# Patient Record
Sex: Female | Born: 1975
Health system: Southern US, Community
[De-identification: ages and names within clinical notes are randomized; demographics above are authoritative.]

## PROBLEM LIST (undated history)

## (undated) DIAGNOSIS — F79 Unspecified intellectual disabilities: Secondary | ICD-10-CM

## (undated) DIAGNOSIS — F988 Other specified behavioral and emotional disorders with onset usually occurring in childhood and adolescence: Secondary | ICD-10-CM

## (undated) DIAGNOSIS — E559 Vitamin D deficiency, unspecified: Secondary | ICD-10-CM

## (undated) DIAGNOSIS — E785 Hyperlipidemia, unspecified: Secondary | ICD-10-CM

## (undated) DIAGNOSIS — F429 Obsessive-compulsive disorder, unspecified: Secondary | ICD-10-CM

## (undated) HISTORY — PX: OTHER SURGICAL HISTORY: SHX169

## (undated) HISTORY — DX: Hyperlipidemia, unspecified: E78.5

## (undated) HISTORY — PX: EYE SURGERY: SHX253

## (undated) HISTORY — PX: HIP SURGERY: SHX245

## (undated) HISTORY — DX: Vitamin D deficiency, unspecified: E55.9

---

## 1998-11-25 ENCOUNTER — Ambulatory Visit (HOSPITAL_COMMUNITY): Admission: RE | Admit: 1998-11-25 | Discharge: 1998-11-25 | Payer: Self-pay | Admitting: Obstetrics and Gynecology

## 2011-01-19 ENCOUNTER — Other Ambulatory Visit: Payer: Self-pay | Admitting: Internal Medicine

## 2011-01-19 DIAGNOSIS — R198 Other specified symptoms and signs involving the digestive system and abdomen: Secondary | ICD-10-CM

## 2011-01-22 ENCOUNTER — Ambulatory Visit
Admission: RE | Admit: 2011-01-22 | Discharge: 2011-01-22 | Disposition: A | Discharge status: Home / Self Care | Payer: PRIVATE HEALTH INSURANCE | Source: Ambulatory Visit | Attending: Internal Medicine | Admitting: Internal Medicine

## 2011-01-22 DIAGNOSIS — R198 Other specified symptoms and signs involving the digestive system and abdomen: Secondary | ICD-10-CM

## 2011-12-25 ENCOUNTER — Ambulatory Visit (HOSPITAL_COMMUNITY)
Admission: RE | Admit: 2011-12-25 | Discharge: 2011-12-25 | Disposition: A | Discharge status: Home / Self Care | Payer: BC Managed Care – PPO | Source: Ambulatory Visit | Attending: Internal Medicine | Admitting: Internal Medicine

## 2011-12-25 ENCOUNTER — Other Ambulatory Visit (HOSPITAL_COMMUNITY): Payer: Self-pay | Admitting: Internal Medicine

## 2011-12-25 DIAGNOSIS — S91009A Unspecified open wound, unspecified ankle, initial encounter: Secondary | ICD-10-CM

## 2011-12-25 DIAGNOSIS — M7989 Other specified soft tissue disorders: Secondary | ICD-10-CM | POA: Insufficient documentation

## 2011-12-25 DIAGNOSIS — S81009A Unspecified open wound, unspecified knee, initial encounter: Secondary | ICD-10-CM

## 2011-12-25 DIAGNOSIS — M79609 Pain in unspecified limb: Secondary | ICD-10-CM | POA: Insufficient documentation

## 2011-12-25 DIAGNOSIS — M25559 Pain in unspecified hip: Secondary | ICD-10-CM | POA: Insufficient documentation

## 2013-05-04 ENCOUNTER — Ambulatory Visit (HOSPITAL_COMMUNITY)
Admission: RE | Admit: 2013-05-04 | Discharge: 2013-05-04 | Disposition: A | Discharge status: Home / Self Care | Payer: BC Managed Care – PPO | Source: Ambulatory Visit | Attending: Emergency Medicine | Admitting: Emergency Medicine

## 2013-05-04 ENCOUNTER — Other Ambulatory Visit (HOSPITAL_COMMUNITY): Payer: Self-pay | Admitting: Emergency Medicine

## 2013-05-04 DIAGNOSIS — M25569 Pain in unspecified knee: Secondary | ICD-10-CM | POA: Insufficient documentation

## 2013-05-04 DIAGNOSIS — M79609 Pain in unspecified limb: Secondary | ICD-10-CM

## 2013-05-16 ENCOUNTER — Ambulatory Visit: Payer: BC Managed Care – PPO | Attending: Sports Medicine | Admitting: Physical Therapy

## 2013-05-16 DIAGNOSIS — IMO0001 Reserved for inherently not codable concepts without codable children: Secondary | ICD-10-CM | POA: Insufficient documentation

## 2013-05-16 DIAGNOSIS — R293 Abnormal posture: Secondary | ICD-10-CM | POA: Insufficient documentation

## 2013-05-16 DIAGNOSIS — M6281 Muscle weakness (generalized): Secondary | ICD-10-CM | POA: Insufficient documentation

## 2013-05-16 DIAGNOSIS — M25569 Pain in unspecified knee: Secondary | ICD-10-CM | POA: Insufficient documentation

## 2013-05-22 ENCOUNTER — Ambulatory Visit: Payer: BC Managed Care – PPO | Attending: Sports Medicine | Admitting: Rehabilitation

## 2013-05-22 DIAGNOSIS — M6281 Muscle weakness (generalized): Secondary | ICD-10-CM | POA: Insufficient documentation

## 2013-05-22 DIAGNOSIS — IMO0001 Reserved for inherently not codable concepts without codable children: Secondary | ICD-10-CM | POA: Insufficient documentation

## 2013-05-22 DIAGNOSIS — R293 Abnormal posture: Secondary | ICD-10-CM | POA: Insufficient documentation

## 2013-05-22 DIAGNOSIS — M25569 Pain in unspecified knee: Secondary | ICD-10-CM | POA: Insufficient documentation

## 2013-05-25 ENCOUNTER — Ambulatory Visit: Payer: BC Managed Care – PPO | Admitting: Physical Therapy

## 2013-05-29 ENCOUNTER — Ambulatory Visit: Payer: BC Managed Care – PPO | Admitting: Rehabilitation

## 2013-06-01 ENCOUNTER — Ambulatory Visit: Payer: BC Managed Care – PPO | Admitting: Physical Therapy

## 2013-06-05 ENCOUNTER — Ambulatory Visit: Payer: BC Managed Care – PPO | Admitting: Physical Therapy

## 2013-06-08 ENCOUNTER — Ambulatory Visit: Payer: BC Managed Care – PPO | Admitting: Physical Therapy

## 2013-06-12 ENCOUNTER — Ambulatory Visit: Payer: BC Managed Care – PPO | Admitting: Physical Therapy

## 2013-06-17 ENCOUNTER — Encounter (HOSPITAL_BASED_OUTPATIENT_CLINIC_OR_DEPARTMENT_OTHER): Payer: Self-pay | Admitting: Emergency Medicine

## 2013-06-17 ENCOUNTER — Emergency Department (HOSPITAL_BASED_OUTPATIENT_CLINIC_OR_DEPARTMENT_OTHER): Payer: BC Managed Care – PPO

## 2013-06-17 ENCOUNTER — Emergency Department (HOSPITAL_BASED_OUTPATIENT_CLINIC_OR_DEPARTMENT_OTHER)
Admission: EM | Admit: 2013-06-17 | Discharge: 2013-06-17 | Disposition: A | Discharge status: Home / Self Care | Payer: BC Managed Care – PPO | Attending: Emergency Medicine | Admitting: Emergency Medicine

## 2013-06-17 DIAGNOSIS — S0990XA Unspecified injury of head, initial encounter: Secondary | ICD-10-CM | POA: Insufficient documentation

## 2013-06-17 DIAGNOSIS — Y9389 Activity, other specified: Secondary | ICD-10-CM | POA: Insufficient documentation

## 2013-06-17 DIAGNOSIS — M545 Low back pain, unspecified: Secondary | ICD-10-CM

## 2013-06-17 DIAGNOSIS — Z862 Personal history of diseases of the blood and blood-forming organs and certain disorders involving the immune mechanism: Secondary | ICD-10-CM | POA: Insufficient documentation

## 2013-06-17 DIAGNOSIS — Y9289 Other specified places as the place of occurrence of the external cause: Secondary | ICD-10-CM | POA: Diagnosis not present

## 2013-06-17 DIAGNOSIS — IMO0002 Reserved for concepts with insufficient information to code with codable children: Secondary | ICD-10-CM | POA: Insufficient documentation

## 2013-06-17 DIAGNOSIS — S3981XA Other specified injuries of abdomen, initial encounter: Secondary | ICD-10-CM | POA: Insufficient documentation

## 2013-06-17 DIAGNOSIS — Z8639 Personal history of other endocrine, nutritional and metabolic disease: Secondary | ICD-10-CM | POA: Insufficient documentation

## 2013-06-17 DIAGNOSIS — Z8659 Personal history of other mental and behavioral disorders: Secondary | ICD-10-CM | POA: Insufficient documentation

## 2013-06-17 HISTORY — DX: Other specified behavioral and emotional disorders with onset usually occurring in childhood and adolescence: F98.8

## 2013-06-17 HISTORY — DX: Obsessive-compulsive disorder, unspecified: F42.9

## 2013-06-17 HISTORY — DX: Unspecified intellectual disabilities: F79

## 2013-06-17 MED ORDER — IBUPROFEN 400 MG PO TABS
600.0000 mg | ORAL_TABLET | Freq: Once | ORAL | Status: AC
  Administered 2013-06-17: 600 mg via ORAL
  Filled 2013-06-17 (×2): qty 1

## 2013-06-17 NOTE — ED Notes (Signed)
Pt has been taken to radiology  
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
## 2013-06-19 ENCOUNTER — Encounter: Payer: Self-pay | Admitting: Physical Therapy

## 2013-06-21 ENCOUNTER — Encounter: Payer: Self-pay | Admitting: Internal Medicine

## 2013-06-21 DIAGNOSIS — F988 Other specified behavioral and emotional disorders with onset usually occurring in childhood and adolescence: Secondary | ICD-10-CM | POA: Insufficient documentation

## 2013-06-21 DIAGNOSIS — F79 Unspecified intellectual disabilities: Secondary | ICD-10-CM | POA: Insufficient documentation

## 2013-06-21 DIAGNOSIS — E785 Hyperlipidemia, unspecified: Secondary | ICD-10-CM | POA: Insufficient documentation

## 2013-06-21 DIAGNOSIS — E559 Vitamin D deficiency, unspecified: Secondary | ICD-10-CM | POA: Insufficient documentation

## 2013-06-22 ENCOUNTER — Encounter: Payer: Self-pay | Admitting: Physical Therapy

## 2013-06-23 ENCOUNTER — Ambulatory Visit (INDEPENDENT_AMBULATORY_CARE_PROVIDER_SITE_OTHER): Payer: BC Managed Care – PPO | Admitting: Emergency Medicine

## 2013-06-23 ENCOUNTER — Encounter: Payer: Self-pay | Admitting: Emergency Medicine

## 2013-06-23 VITALS — BP 138/88 | HR 84 | Temp 97.8°F | Resp 18 | Ht <= 58 in | Wt 137.0 lb

## 2013-06-23 DIAGNOSIS — R5381 Other malaise: Secondary | ICD-10-CM

## 2013-06-23 DIAGNOSIS — Z79899 Other long term (current) drug therapy: Secondary | ICD-10-CM

## 2013-06-23 DIAGNOSIS — E559 Vitamin D deficiency, unspecified: Secondary | ICD-10-CM

## 2013-06-23 DIAGNOSIS — E782 Mixed hyperlipidemia: Secondary | ICD-10-CM

## 2013-06-23 LAB — CBC WITH DIFFERENTIAL/PLATELET
Basophils Absolute: 0 10*3/uL (ref 0.0–0.1)
Eosinophils Absolute: 0.3 10*3/uL (ref 0.0–0.7)
Eosinophils Relative: 4 % (ref 0–5)
HCT: 40.9 % (ref 36.0–46.0)
Lymphocytes Relative: 27 % (ref 12–46)
MCH: 30.7 pg (ref 26.0–34.0)
MCV: 91.1 fL (ref 78.0–100.0)
Monocytes Absolute: 0.6 10*3/uL (ref 0.1–1.0)
Neutro Abs: 3.3 10*3/uL (ref 1.7–7.7)
Platelets: 318 10*3/uL (ref 150–400)
RDW: 12.7 % (ref 11.5–15.5)
WBC: 5.7 10*3/uL (ref 4.0–10.5)

## 2013-06-23 LAB — HEPATIC FUNCTION PANEL
Albumin: 4.1 g/dL (ref 3.5–5.2)
Alkaline Phosphatase: 70 U/L (ref 39–117)
Total Bilirubin: 0.5 mg/dL (ref 0.3–1.2)
Total Protein: 6.9 g/dL (ref 6.0–8.3)

## 2013-06-23 LAB — LIPID PANEL
Cholesterol: 181 mg/dL (ref 0–200)
HDL: 61 mg/dL (ref >39)
Total CHOL/HDL Ratio: 3 Ratio
Triglycerides: 59 mg/dL (ref <150)

## 2013-06-23 NOTE — Patient Instructions (Signed)
Fat and Cholesterol Control Diet  Fat and cholesterol levels in your blood and organs are influenced by your diet. High levels of fat and cholesterol may lead to diseases of the heart, small and large blood vessels, gallbladder, liver, and pancreas.  CONTROLLING FAT AND CHOLESTEROL WITH DIET  Although exercise and lifestyle factors are important, your diet is key. That is because certain foods are known to raise cholesterol and others to lower it. The goal is to balance foods for their effect on cholesterol and more importantly, to replace saturated and trans fat with other types of fat, such as monounsaturated fat, polyunsaturated fat, and omega-3 fatty acids.  On average, a person should consume no more than 15 to 17 g of saturated fat daily. Saturated and trans fats are considered "bad" fats, and they will raise LDL cholesterol. Saturated fats are primarily found in animal products such as meats, butter, and cream. However, that does not mean you need to give up all your favorite foods. Today, there are good tasting, low-fat, low-cholesterol substitutes for most of the things you like to eat. Choose low-fat or nonfat alternatives. Choose round or loin cuts of red meat. These types of cuts are lowest in fat and cholesterol. Chicken (without the skin), fish, veal, and ground turkey breast are great choices. Eliminate fatty meats, such as hot dogs and salami. Even shellfish have little or no saturated fat. Have a 3 oz (85 g) portion when you eat lean meat, poultry, or fish.  Trans fats are also called "partially hydrogenated oils." They are oils that have been scientifically manipulated so that they are solid at room temperature resulting in a longer shelf life and improved taste and texture of foods in which they are added. Trans fats are found in stick margarine, some tub margarines, cookies, crackers, and baked goods.   When baking and cooking, oils are a great substitute for butter. The monounsaturated oils are  especially beneficial since it is believed they lower LDL and raise HDL. The oils you should avoid entirely are saturated tropical oils, such as coconut and palm.   Remember to eat a lot from food groups that are naturally free of saturated and trans fat, including fish, fruit, vegetables, beans, grains (barley, rice, couscous, bulgur wheat), and pasta (without cream sauces).   IDENTIFYING FOODS THAT LOWER FAT AND CHOLESTEROL   Soluble fiber may lower your cholesterol. This type of fiber is found in fruits such as apples, vegetables such as broccoli, potatoes, and carrots, legumes such as beans, peas, and lentils, and grains such as barley. Foods fortified with plant sterols (phytosterol) may also lower cholesterol. You should eat at least 2 g per day of these foods for a cholesterol lowering effect.   Read package labels to identify low-saturated fats, trans fat free, and low-fat foods at the supermarket. Select cheeses that have only 2 to 3 g saturated fat per ounce. Use a heart-healthy tub margarine that is free of trans fats or partially hydrogenated oil. When buying baked goods (cookies, crackers), avoid partially hydrogenated oils. Breads and muffins should be made from whole grains (whole-wheat or whole oat flour, instead of "flour" or "enriched flour"). Buy non-creamy canned soups with reduced salt and no added fats.   FOOD PREPARATION TECHNIQUES   Never deep-fry. If you must fry, either stir-fry, which uses very little fat, or use non-stick cooking sprays. When possible, broil, bake, or roast meats, and steam vegetables. Instead of putting butter or margarine on vegetables, use lemon   and herbs, applesauce, and cinnamon (for squash and sweet potatoes). Use nonfat yogurt, salsa, and low-fat dressings for salads.   LOW-SATURATED FAT / LOW-FAT FOOD SUBSTITUTES  Meats / Saturated Fat (g)  · Avoid: Steak, marbled (3 oz/85 g) / 11 g  · Choose: Steak, lean (3 oz/85 g) / 4 g  · Avoid: Hamburger (3 oz/85 g) / 7  g  · Choose: Hamburger, lean (3 oz/85 g) / 5 g  · Avoid: Ham (3 oz/85 g) / 6 g  · Choose: Ham, lean cut (3 oz/85 g) / 2.4 g  · Avoid: Chicken, with skin, dark meat (3 oz/85 g) / 4 g  · Choose: Chicken, skin removed, dark meat (3 oz/85 g) / 2 g  · Avoid: Chicken, with skin, light meat (3 oz/85 g) / 2.5 g  · Choose: Chicken, skin removed, light meat (3 oz/85 g) / 1 g  Dairy / Saturated Fat (g)  · Avoid: Whole milk (1 cup) / 5 g  · Choose: Low-fat milk, 2% (1 cup) / 3 g  · Choose: Low-fat milk, 1% (1 cup) / 1.5 g  · Choose: Skim milk (1 cup) / 0.3 g  · Avoid: Hard cheese (1 oz/28 g) / 6 g  · Choose: Skim milk cheese (1 oz/28 g) / 2 to 3 g  · Avoid: Cottage cheese, 4% fat (1 cup) / 6.5 g  · Choose: Low-fat cottage cheese, 1% fat (1 cup) / 1.5 g  · Avoid: Ice cream (1 cup) / 9 g  · Choose: Sherbet (1 cup) / 2.5 g  · Choose: Nonfat frozen yogurt (1 cup) / 0.3 g  · Choose: Frozen fruit bar / trace  · Avoid: Whipped cream (1 tbs) / 3.5 g  · Choose: Nondairy whipped topping (1 tbs) / 1 g  Condiments / Saturated Fat (g)  · Avoid: Mayonnaise (1 tbs) / 2 g  · Choose: Low-fat mayonnaise (1 tbs) / 1 g  · Avoid: Butter (1 tbs) / 7 g  · Choose: Extra light margarine (1 tbs) / 1 g  · Avoid: Coconut oil (1 tbs) / 11.8 g  · Choose: Olive oil (1 tbs) / 1.8 g  · Choose: Corn oil (1 tbs) / 1.7 g  · Choose: Safflower oil (1 tbs) / 1.2 g  · Choose: Sunflower oil (1 tbs) / 1.4 g  · Choose: Soybean oil (1 tbs) / 2.4 g  · Choose: Canola oil (1 tbs) / 1 g  Document Released: 07/06/2005 Document Revised: 10/31/2012 Document Reviewed: 12/25/2010  ExitCare® Patient Information ©2014 [employer].

## 2013-06-24 LAB — VITAMIN D 25 HYDROXY (VIT D DEFICIENCY, FRACTURES): Vit D, 25-Hydroxy: 16 ng/mL — ABNORMAL LOW (ref 30–89)
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
## 2013-06-26 ENCOUNTER — Telehealth: Payer: Self-pay | Admitting: *Deleted

## 2013-06-26 NOTE — Telephone Encounter (Signed)
Message copied by Nicholaus Corolla A on Mon Jun 26, 2013  9:16 AM ------      Message from: [place], [place] R      Created: Sun Jun 25, 2013  7:45 PM       Cholesterol still mild elevated need better diet and cardio QD. Add 5000IU Vit D and 250 MG MAGnesium. Recheck 3 month OV ------

## 2013-06-28 NOTE — Telephone Encounter (Signed)
Spoke with patient's mom about lab results and instructions.

## 2013-07-31 ENCOUNTER — Encounter: Payer: Self-pay | Admitting: Emergency Medicine

## 2013-07-31 ENCOUNTER — Ambulatory Visit (INDEPENDENT_AMBULATORY_CARE_PROVIDER_SITE_OTHER): Payer: BC Managed Care – PPO | Admitting: Emergency Medicine

## 2013-07-31 VITALS — BP 100/62 | HR 68 | Temp 97.8°F | Resp 16 | Ht <= 58 in | Wt 135.0 lb

## 2013-07-31 DIAGNOSIS — J209 Acute bronchitis, unspecified: Secondary | ICD-10-CM

## 2013-07-31 DIAGNOSIS — J309 Allergic rhinitis, unspecified: Secondary | ICD-10-CM

## 2013-07-31 DIAGNOSIS — R05 Cough: Secondary | ICD-10-CM

## 2013-07-31 DIAGNOSIS — R059 Cough, unspecified: Secondary | ICD-10-CM

## 2013-07-31 MED ORDER — ALBUTEROL SULFATE HFA 108 (90 BASE) MCG/ACT IN AERS: 2.0000 | INHALATION_SPRAY | Freq: Four times a day (QID) | RESPIRATORY_TRACT | Status: DC | PRN

## 2013-07-31 MED ORDER — BENZONATATE 200 MG PO CAPS: 200.0000 mg | ORAL_CAPSULE | Freq: Three times a day (TID) | ORAL | Status: DC | PRN

## 2013-07-31 MED ORDER — PREDNISONE 10 MG PO TABS: ORAL_TABLET | ORAL | Status: DC

## 2013-07-31 MED ORDER — AZITHROMYCIN 250 MG PO TABS: ORAL_TABLET | ORAL | Status: AC

## 2013-07-31 NOTE — Patient Instructions (Signed)
Bronchitis °Bronchitis is swelling (inflammation) of the air tubes leading to your lungs (bronchi). This causes mucus and a cough. If the swelling gets bad, you may have trouble breathing. °HOME CARE  °· Rest. °· Drink enough fluids to keep your pee (urine) clear or pale yellow (unless you have a condition where you have to watch how much you drink). °· Only take medicine as told by your doctor. If you were given antibiotic medicines, finish them even if you start to feel better. °· Avoid smoke, irritating chemicals, and strong smells. These make the problem worse. Quit smoking if you smoke. This helps your lungs heal faster. °· Use a cool mist humidifier. Change the water in the humidifier every day. You can also sit in the bathroom with hot shower running for 5 10 minutes. Keep the door closed. °· See your health care provider as told. °· Wash your hands often. °GET HELP IF: °Your problems do not get better after 1 week. °GET HELP RIGHT AWAY IF:  °· Your fever gets worse. °· You have chills. °· Your chest hurts. °· Your problems breathing get worse. °· You have blood in your mucus. °· You pass out (faint). °· You feel lightheaded. °· You have a bad headache. °· You throw up (vomit) again and again. °MAKE SURE YOU: °· Understand these instructions. °· Will watch your condition. °· Will get help right away if you are not doing well or get worse. °Document Released: 12/23/2007 Document Revised: 04/26/2013 Document Reviewed: 02/28/2013 °ExitCare® Patient Information ©2014 [employer]. ° °

## 2013-07-31 NOTE — Progress Notes (Signed)
   Subjective:    Patient ID: Cathy Howard, female    DOB: Jun 11, 1976, 38 y.o.   MRN: [id number]  HPI Comments: 38 yo noticed cough/ ST Thursday. She notes increased symptoms with congestion and fever over the weekend. No relief with Muccinex/ Allegra. She has increased thick production with her cough and fatigue.   Current Outpatient Prescriptions on File Prior to Visit  Medication Sig Dispense Refill  . amphetamine-dextroamphetamine (ADDERALL XR) 15 MG 24 hr capsule Take 15 mg by mouth every morning.      Marland Kitchen. FLUoxetine (PROZAC) 20 MG tablet Take 20 mg by mouth. Takes 2 pills daily = 40 mg daily      . topiramate (TOPAMAX) 100 MG tablet Take 100 mg by mouth 2 (two) times daily.       No current facility-administered medications on file prior to visit.   ALLERGIES Maxitrol  Past Medical History  Diagnosis Date  . ADD (attention deficit disorder)   . Mental retardation   . OCD (obsessive compulsive disorder)   . Hyperlipidemia   . Unspecified vitamin D deficiency        Review of Systems  Constitutional: Positive for fever and fatigue.  HENT: Positive for congestion and sore throat.   Respiratory: Positive for cough.   All other systems reviewed and are negative.   BP 100/62  Pulse 68  Temp(Src) 97.8 F (36.6 C) (Temporal)  Resp 16  Ht 4' 8.75" (1.441 m)  Wt 135 lb (61.236 kg)  BMI 29.49 kg/m2     Objective:   Physical Exam  Nursing note and vitals reviewed. Constitutional: She is oriented to person, place, and time. She appears well-developed and well-nourished.  HENT:  Head: Normocephalic and atraumatic.  Right Ear: External ear normal.  Left Ear: External ear normal.  Nose: Nose normal.  Mouth/Throat: Oropharynx is clear and moist. No oropharyngeal exudate.  cloudy TMs  Eyes: Conjunctivae and EOM are normal.  Neck: Normal range of motion.  Cardiovascular: Normal rate, regular rhythm, normal heart sounds and intact distal pulses.   Pulmonary/Chest: Effort  normal.  Congested cough  Musculoskeletal: Normal range of motion.  Lymphadenopathy:    She has no cervical adenopathy.  Neurological: She is alert and oriented to person, place, and time.  Skin: Skin is warm and dry.  Psychiatric: She has a normal mood and affect. Judgment normal.          Assessment & Plan:  Cough/ Bronchitis/ URI/ Allergic rhinitis- Allegra OTC, increase H2o, allergy hygiene explained. Pred DP 10 mg, Tessalon Perles 200 mg, Albuterol inhaler all AD, if sx increase ZPAK.

## 2013-08-07 ENCOUNTER — Encounter: Payer: Self-pay | Admitting: Emergency Medicine

## 2013-08-07 ENCOUNTER — Ambulatory Visit (INDEPENDENT_AMBULATORY_CARE_PROVIDER_SITE_OTHER): Payer: BC Managed Care – PPO | Admitting: Emergency Medicine

## 2013-08-07 ENCOUNTER — Encounter (INDEPENDENT_AMBULATORY_CARE_PROVIDER_SITE_OTHER): Payer: Self-pay

## 2013-08-07 VITALS — BP 126/82 | HR 76 | Temp 97.8°F | Resp 18 | Ht <= 58 in | Wt 138.0 lb

## 2013-08-07 DIAGNOSIS — J209 Acute bronchitis, unspecified: Secondary | ICD-10-CM

## 2013-08-07 DIAGNOSIS — L738 Other specified follicular disorders: Secondary | ICD-10-CM

## 2013-08-07 DIAGNOSIS — L678 Other hair color and hair shaft abnormalities: Secondary | ICD-10-CM

## 2013-08-07 DIAGNOSIS — L739 Follicular disorder, unspecified: Secondary | ICD-10-CM

## 2013-08-07 MED ORDER — DOXYCYCLINE HYCLATE 100 MG PO TABS: 100.0000 mg | ORAL_TABLET | Freq: Two times a day (BID) | ORAL | Status: DC

## 2013-08-07 NOTE — Patient Instructions (Signed)
Folliculitis  Folliculitis is redness, soreness, and swelling (inflammation) of the hair follicles. This condition can occur anywhere on the body. People with weakened immune systems, diabetes, or obesity have a greater risk of getting folliculitis. CAUSES  Bacterial infection. This is the most common cause.  Fungal infection.  Viral infection.  Contact with certain chemicals, especially oils and tars. Long-term folliculitis can result from bacteria that live in the nostrils. The bacteria may trigger multiple outbreaks of folliculitis over time. SYMPTOMS Folliculitis most commonly occurs on the scalp, thighs, legs, back, buttocks, and areas where hair is shaved frequently. An early sign of folliculitis is a small, white or yellow, pus-filled, itchy lesion (pustule). These lesions appear on a red, inflamed follicle. They are usually less than 0.2 inches (5 mm) wide. When there is an infection of the follicle that goes deeper, it becomes a boil or furuncle. A group of closely packed boils creates a larger lesion (carbuncle). Carbuncles tend to occur in hairy, sweaty areas of the body. DIAGNOSIS  Your caregiver can usually tell what is wrong by doing a physical exam. A sample may be taken from one of the lesions and tested in a lab. This can help determine what is causing your folliculitis. TREATMENT  Treatment may include:  Applying warm compresses to the affected areas.  Taking antibiotic medicines orally or applying them to the skin.  Draining the lesions if they contain a large amount of pus or fluid.  Laser hair removal for cases of long-lasting folliculitis. This helps to prevent regrowth of the hair. HOME CARE INSTRUCTIONS  Apply warm compresses to the affected areas as directed by your caregiver.  If antibiotics are prescribed, take them as directed. Finish them even if you start to feel better.  You may take over-the-counter medicines to relieve itching.  Do not shave  irritated skin.  Follow up with your caregiver as directed. SEEK IMMEDIATE MEDICAL CARE IF:   You have increasing redness, swelling, or pain in the affected area.  You have a fever. MAKE SURE YOU:  Understand these instructions.  Will watch your condition.  Will get help right away if you are not doing well or get worse. Document Released: 09/14/2001 Document Revised: 01/05/2012 Document Reviewed: 10/06/2011 ExitCare Patient Information 2014 [employer].  

## 2013-08-07 NOTE — Progress Notes (Signed)
Subjective:    Patient ID: Cathy Howard, female    DOB: Nov 27, 1975, 38 y.o.   MRN: [id number]  HPI Comments: 38 YO FEMALE RECENTLY treated with Prednisone DP for Bronchitis with increased symptoms mother started ZPAK. She finished ZPAK and is on the Prednisone 10 mg she has 2 days left of dose pack. She is still coughing and now wheezing.   She has also noticed tiny red and yellow pimple over chest and back over last day or so. She denies fever, itching or new exposures.  Cough Associated symptoms include a rash and wheezing.  Wheezing  Associated symptoms include coughing and a rash.   Current Outpatient Prescriptions on File Prior to Visit  Medication Sig Dispense Refill  . amphetamine-dextroamphetamine (ADDERALL XR) 15 MG 24 hr capsule Take 15 mg by mouth every morning.      . benzonatate (TESSALON) 200 MG capsule Take 1 capsule (200 mg total) by mouth 3 (three) times daily as needed for cough.  20 capsule  0  . Cholecalciferol (VITAMIN D3) 5000 UNITS CAPS Take 5,000 Units by mouth daily.      Marland Kitchen FLUoxetine (PROZAC) 20 MG tablet Take 20 mg by mouth. Takes 2 pills daily = 40 mg daily      . Magnesium 250 MG TABS Take 250 mg by mouth daily.      . predniSONE (DELTASONE) 10 MG tablet 1 po TID x 3 days, 1 PO BID x 3 days, 1 po QD x 5 days  30 tablet  0  . topiramate (TOPAMAX) 100 MG tablet Take 100 mg by mouth 2 (two) times daily.      Marland Kitchen albuterol (PROVENTIL HFA;VENTOLIN HFA) 108 (90 BASE) MCG/ACT inhaler Inhale 2 puffs into the lungs every 6 (six) hours as needed for wheezing or shortness of breath.  1 Inhaler  2   No current facility-administered medications on file prior to visit.   ALLERGIES Maxitrol  Past Medical History  Diagnosis Date  . ADD (attention deficit disorder)   . Mental retardation   . OCD (obsessive compulsive disorder)   . Hyperlipidemia   . Unspecified vitamin D deficiency       Review of Systems  Respiratory: Positive for cough and wheezing.   Skin:  Positive for rash.  All other systems reviewed and are negative.   BP 126/82  Pulse 76  Temp(Src) 97.8 F (36.6 C) (Temporal)  Resp 18  Ht 4' 8.75" (1.441 m)  Wt 138 lb (62.596 kg)  BMI 30.15 kg/m2     Objective:   Physical Exam  Nursing note and vitals reviewed. Constitutional: She is oriented to person, place, and time. She appears well-developed and well-nourished. No distress.  HENT:  Head: Normocephalic and atraumatic.  Right Ear: External ear normal.  Left Ear: External ear normal.  Nose: Nose normal.  Mouth/Throat: Oropharynx is clear and moist.  Eyes: Conjunctivae and EOM are normal.  Neck: Normal range of motion. Neck supple. No JVD present. No thyromegaly present.  Cardiovascular: Normal rate, regular rhythm, normal heart sounds and intact distal pulses.   Pulmonary/Chest: Effort normal and breath sounds normal.  + improvement but still with congested cough  Abdominal: Soft. Bowel sounds are normal. She exhibits no distension and no mass. There is no tenderness. There is no rebound and no guarding.  Musculoskeletal: Normal range of motion. She exhibits no edema and no tenderness.  Lymphadenopathy:    She has no cervical adenopathy.  Neurological: She is alert and oriented to  person, place, and time. No cranial nerve deficit.  Skin: Skin is warm and dry. Rash noted. No erythema. No pallor.     Psychiatric: She has a normal mood and affect. Her behavior is normal. Judgment and thought content normal.          Assessment & Plan:  1. ? Folliculitis- Hygiene explained, Doxy 100 mg AD, w/c with results or if sx increase 2. Bronchitis- Start Inhaler AD, spacer given, finish Pred DP AD, w/c if SX increase or ER.

## 2013-08-16 ENCOUNTER — Telehealth: Payer: Self-pay | Admitting: Internal Medicine

## 2013-08-16 NOTE — Telephone Encounter (Signed)
Chestine Sporeatricia Thomas called for pt., to get advise on continued cough, and congestion.  Ranata finished antibiotics today. Per Loree FeeMelissa Smith, continue to push fluids and add sudafed daily for 2 days.  If this does not help a referral to allergist may be needed.

## 2013-09-27 ENCOUNTER — Encounter: Payer: Self-pay | Admitting: Emergency Medicine

## 2013-09-27 ENCOUNTER — Ambulatory Visit (INDEPENDENT_AMBULATORY_CARE_PROVIDER_SITE_OTHER): Payer: BC Managed Care – PPO | Admitting: Emergency Medicine

## 2013-09-27 VITALS — BP 118/64 | HR 64 | Temp 98.2°F | Resp 16 | Ht <= 58 in | Wt 137.0 lb

## 2013-09-27 DIAGNOSIS — R059 Cough, unspecified: Secondary | ICD-10-CM

## 2013-09-27 DIAGNOSIS — E782 Mixed hyperlipidemia: Secondary | ICD-10-CM

## 2013-09-27 DIAGNOSIS — R05 Cough: Secondary | ICD-10-CM

## 2013-09-27 DIAGNOSIS — R5383 Other fatigue: Secondary | ICD-10-CM

## 2013-09-27 DIAGNOSIS — R5381 Other malaise: Secondary | ICD-10-CM

## 2013-09-27 DIAGNOSIS — I10 Essential (primary) hypertension: Secondary | ICD-10-CM

## 2013-09-27 LAB — HEPATIC FUNCTION PANEL
ALT: 12 U/L (ref 0–35)
AST: 20 U/L (ref 0–37)
Albumin: 4.2 g/dL (ref 3.5–5.2)
Alkaline Phosphatase: 81 U/L (ref 39–117)
BILIRUBIN INDIRECT: 0.2 mg/dL (ref 0.2–1.2)
Bilirubin, Direct: 0.1 mg/dL (ref 0.0–0.3)
TOTAL PROTEIN: 6.4 g/dL (ref 6.0–8.3)
Total Bilirubin: 0.3 mg/dL (ref 0.2–1.2)

## 2013-09-27 LAB — BASIC METABOLIC PANEL WITH GFR
BUN: 20 mg/dL (ref 6–23)
CO2: 26 meq/L (ref 19–32)
CREATININE: 0.56 mg/dL (ref 0.50–1.10)
Calcium: 9.3 mg/dL (ref 8.4–10.5)
Chloride: 104 mEq/L (ref 96–112)
GFR, Est African American: >89 mL/min
GFR, Est Non African American: >89 mL/min
GLUCOSE: 78 mg/dL (ref 70–99)
Potassium: 4 mEq/L (ref 3.5–5.3)
Sodium: 139 mEq/L (ref 135–145)

## 2013-09-27 LAB — CBC WITH DIFFERENTIAL/PLATELET
BASOS ABS: 0 10*3/uL (ref 0.0–0.1)
Basophils Relative: 0 % (ref 0–1)
EOS ABS: 0.1 10*3/uL (ref 0.0–0.7)
EOS PCT: 3 % (ref 0–5)
HCT: 40.2 % (ref 36.0–46.0)
Hemoglobin: 13.4 g/dL (ref 12.0–15.0)
Lymphocytes Relative: 23 % (ref 12–46)
Lymphs Abs: 1.1 10*3/uL (ref 0.7–4.0)
MCH: 30.4 pg (ref 26.0–34.0)
MCHC: 33.3 g/dL (ref 30.0–36.0)
MCV: 91.2 fL (ref 78.0–100.0)
MONO ABS: 0.5 10*3/uL (ref 0.1–1.0)
Monocytes Relative: 10 % (ref 3–12)
Neutro Abs: 3.1 10*3/uL (ref 1.7–7.7)
Neutrophils Relative %: 64 % (ref 43–77)
Platelets: 305 10*3/uL (ref 150–400)
RBC: 4.41 MIL/uL (ref 3.87–5.11)
RDW: 13.7 % (ref 11.5–15.5)
WBC: 4.9 10*3/uL (ref 4.0–10.5)

## 2013-09-27 LAB — LIPID PANEL
CHOLESTEROL: 191 mg/dL (ref 0–200)
HDL: 65 mg/dL (ref >39)
LDL CALC: 107 mg/dL — AB (ref 0–99)
Total CHOL/HDL Ratio: 2.9 Ratio
Triglycerides: 96 mg/dL (ref <150)
VLDL: 19 mg/dL (ref 0–40)

## 2013-09-27 NOTE — Patient Instructions (Signed)
Fat and Cholesterol Control Diet Fat and cholesterol levels in your blood and organs are influenced by your diet. High levels of fat and cholesterol may lead to diseases of the heart, small and large blood vessels, gallbladder, liver, and pancreas. CONTROLLING FAT AND CHOLESTEROL WITH DIET Although exercise and lifestyle factors are important, your diet is key. That is because certain foods are known to raise cholesterol and others to lower it. The goal is to balance foods for their effect on cholesterol and more importantly, to replace saturated and trans fat with other types of fat, such as monounsaturated fat, polyunsaturated fat, and omega-3 fatty acids. On average, a person should consume no more than 15 to 17 g of saturated fat daily. Saturated and trans fats are considered "bad" fats, and they will raise LDL cholesterol. Saturated fats are primarily found in animal products such as meats, butter, and cream. However, that does not mean you need to give up all your favorite foods. Today, there are good tasting, low-fat, low-cholesterol substitutes for most of the things you like to eat. Choose low-fat or nonfat alternatives. Choose round or loin cuts of red meat. These types of cuts are lowest in fat and cholesterol. Chicken (without the skin), fish, veal, and ground turkey breast are great choices. Eliminate fatty meats, such as hot dogs and salami. Even shellfish have little or no saturated fat. Have a 3 oz (85 g) portion when you eat lean meat, poultry, or fish. Trans fats are also called "partially hydrogenated oils." They are oils that have been scientifically manipulated so that they are solid at room temperature resulting in a longer shelf life and improved taste and texture of foods in which they are added. Trans fats are found in stick margarine, some tub margarines, cookies, crackers, and baked goods.  When baking and cooking, oils are a great substitute for butter. The monounsaturated oils are  especially beneficial since it is believed they lower LDL and raise HDL. The oils you should avoid entirely are saturated tropical oils, such as coconut and palm.  Remember to eat a lot from food groups that are naturally free of saturated and trans fat, including fish, fruit, vegetables, beans, grains (barley, rice, couscous, bulgur wheat), and pasta (without cream sauces).  IDENTIFYING FOODS THAT LOWER FAT AND CHOLESTEROL  Soluble fiber may lower your cholesterol. This type of fiber is found in fruits such as apples, vegetables such as broccoli, potatoes, and carrots, legumes such as beans, peas, and lentils, and grains such as barley. Foods fortified with plant sterols (phytosterol) may also lower cholesterol. You should eat at least 2 g per day of these foods for a cholesterol lowering effect.  Read package labels to identify low-saturated fats, trans fat free, and low-fat foods at the supermarket. Select cheeses that have only 2 to 3 g saturated fat per ounce. Use a heart-healthy tub margarine that is free of trans fats or partially hydrogenated oil. When buying baked goods (cookies, crackers), avoid partially hydrogenated oils. Breads and muffins should be made from whole grains (whole-wheat or whole oat flour, instead of "flour" or "enriched flour"). Buy non-creamy canned soups with reduced salt and no added fats.  FOOD PREPARATION TECHNIQUES  Never deep-fry. If you must fry, either stir-fry, which uses very little fat, or use non-stick cooking sprays. When possible, broil, bake, or roast meats, and steam vegetables. Instead of putting butter or margarine on vegetables, use lemon and herbs, applesauce, and cinnamon (for squash and sweet potatoes). Use nonfat   yogurt, salsa, and low-fat dressings for salads.  LOW-SATURATED FAT / LOW-FAT FOOD SUBSTITUTES Meats / Saturated Fat (g)  Avoid: Steak, marbled (3 oz/85 g) / 11 g  Choose: Steak, lean (3 oz/85 g) / 4 g  Avoid: Hamburger (3 oz/85 g) / 7  g  Choose: Hamburger, lean (3 oz/85 g) / 5 g  Avoid: Ham (3 oz/85 g) / 6 g  Choose: Ham, lean cut (3 oz/85 g) / 2.4 g  Avoid: Chicken, with skin, dark meat (3 oz/85 g) / 4 g  Choose: Chicken, skin removed, dark meat (3 oz/85 g) / 2 g  Avoid: Chicken, with skin, light meat (3 oz/85 g) / 2.5 g  Choose: Chicken, skin removed, light meat (3 oz/85 g) / 1 g Dairy / Saturated Fat (g)  Avoid: Whole milk (1 cup) / 5 g  Choose: Low-fat milk, 2% (1 cup) / 3 g  Choose: Low-fat milk, 1% (1 cup) / 1.5 g  Choose: Skim milk (1 cup) / 0.3 g  Avoid: Hard cheese (1 oz/28 g) / 6 g  Choose: Skim milk cheese (1 oz/28 g) / 2 to 3 g  Avoid: Cottage cheese, 4% fat (1 cup) / 6.5 g  Choose: Low-fat cottage cheese, 1% fat (1 cup) / 1.5 g  Avoid: Ice cream (1 cup) / 9 g  Choose: Sherbet (1 cup) / 2.5 g  Choose: Nonfat frozen yogurt (1 cup) / 0.3 g  Choose: Frozen fruit bar / trace  Avoid: Whipped cream (1 tbs) / 3.5 g  Choose: Nondairy whipped topping (1 tbs) / 1 g Condiments / Saturated Fat (g)  Avoid: Mayonnaise (1 tbs) / 2 g  Choose: Low-fat mayonnaise (1 tbs) / 1 g  Avoid: Butter (1 tbs) / 7 g  Choose: Extra light margarine (1 tbs) / 1 g  Avoid: Coconut oil (1 tbs) / 11.8 g  Choose: Olive oil (1 tbs) / 1.8 g  Choose: Corn oil (1 tbs) / 1.7 g  Choose: Safflower oil (1 tbs) / 1.2 g  Choose: Sunflower oil (1 tbs) / 1.4 g  Choose: Soybean oil (1 tbs) / 2.4 g  Choose: Canola oil (1 tbs) / 1 g Document Released: 07/06/2005 Document Revised: 10/31/2012 Document Reviewed: 12/25/2010 Exit[employer] Patient Information 2014 [place], [place]. Cough, Adult  A cough is a reflex that helps clear your throat and airways. It can help heal the body or may be a reaction to an irritated airway. A cough may only last 2 or 3 weeks (acute) or may last more than 8 weeks (chronic).  CAUSES Acute cough:  Viral or bacterial infections. Chronic  cough:  Infections.  Allergies.  Asthma.  Post-nasal drip.  Smoking.  Heartburn or acid reflux.  Some medicines.  Chronic lung problems (COPD).  Cancer. SYMPTOMS   Cough.  Fever.  Chest pain.  Increased breathing rate.  High-pitched whistling sound when breathing (wheezing).  Colored mucus that you cough up (sputum). TREATMENT   A bacterial cough may be treated with antibiotic medicine.  A viral cough must run its course and will not respond to antibiotics.  Your caregiver may recommend other treatments if you have a chronic cough. HOME CARE INSTRUCTIONS   Only take over-the-counter or prescription medicines for pain, discomfort, or fever as directed by your caregiver. Use cough suppressants only as directed by your caregiver.  Use a cold steam vaporizer or humidifier in your bedroom or home to help loosen secretions.  Sleep in a semi-upright position if your cough  is worse at night.  Rest as needed.  Stop smoking if you smoke. SEEK IMMEDIATE MEDICAL CARE IF:   You have pus in your sputum.  Your cough starts to worsen.  You cannot control your cough with suppressants and are losing sleep.  You begin coughing up blood.  You have difficulty breathing.  You develop pain which is getting worse or is uncontrolled with medicine.  You have a fever. MAKE SURE YOU:   Understand these instructions.  Will watch your condition.  Will get help right away if you are not doing well or get worse. Document Released: 01/02/2011 Document Revised: 09/28/2011 Document Reviewed: 01/02/2011 [facility] Patient Information 2014 [place], [place].

## 2013-09-27 NOTE — Progress Notes (Signed)
Subjective:    Patient ID: Cathy Howard, female    DOB: [birth year], 38 y.o.   MRN: [id number]  HPI Comments: 38 yo female with f/u for cholesterol. He has not been exercising routinely but keeps busy. He has not been eating healthy. She has started coughing again and notes she has been on allergy medicine w/o relief. She denies any reflux. Last labs CHOL         181   06/23/2013 HDL           61   06/23/2013 LDLCALC      108   06/23/2013 TRIG          59   06/23/2013 CHOLHDL      3.0   06/23/2013 ALT           11   06/23/2013 AST           22   06/23/2013 ALKPHOS       70   06/23/2013 BILITOT      0.5   06/23/2013 CBC WBC            5.7    06/23/2013 0957 RBC           4.49    06/23/2013 0957 HGB           13.8    06/23/2013 0957 HCT           40.9    06/23/2013 0957 PLT            318    06/23/2013 0957 MCV           91.1    06/23/2013 0957 MCH           30.7    06/23/2013 0957 MCHC          33.7    06/23/2013 0957 RDW           12.7    06/23/2013 0957 LYMPHSABS      1.6    06/23/2013 0957 MONOABS        0.6    06/23/2013 0957 EOSABS         0.3    06/23/2013 0957 BASOSABS       0.0    06/23/2013 0957    Current Outpatient Prescriptions on File Prior to Visit  Medication Sig Dispense Refill  . albuterol (PROVENTIL HFA;VENTOLIN HFA) 108 (90 BASE) MCG/ACT inhaler Inhale 2 puffs into the lungs every 6 (six) hours as needed for wheezing or shortness of breath.  1 Inhaler  2  . amphetamine-dextroamphetamine (ADDERALL XR) 15 MG 24 hr capsule Take 15 mg by mouth every morning.      . Cholecalciferol (VITAMIN D3) 5000 UNITS CAPS Take 5,000 Units by mouth daily.      Marland Kitchen FLUoxetine (PROZAC) 20 MG tablet Take 20 mg by mouth. Takes 2 pills daily = 40 mg daily      . Magnesium 250 MG TABS Take 250 mg by mouth daily.      Marland Kitchen topiramate (TOPAMAX) 100 MG tablet Take 100 mg by mouth 2 (two) times daily.       No current facility-administered medications on file prior to visit.   Allergies  Allergen Reactions  .  Maxitrol [Neomycin-Polymyxin-Dexameth]    Past Medical History  Diagnosis Date  . ADD (attention deficit disorder)   . Mental retardation   . OCD (obsessive compulsive disorder)   . Hyperlipidemia   . Unspecified vitamin D deficiency  Review of Systems  HENT: Positive for congestion and postnasal drip.   Respiratory: Positive for cough.   All other systems reviewed and are negative.   BP 118/64  Pulse 64  Temp(Src) 98.2 F (36.8 C) (Temporal)  Resp 16  Ht 4' 8.5" (1.435 m)  Wt 137 lb (62.143 kg)  BMI 30.18 kg/m2     Objective:   Physical Exam  Nursing note and vitals reviewed. Constitutional: She is oriented to person, place, and time. She appears well-developed and well-nourished. No distress.  HENT:  Head: Normocephalic and atraumatic.  Right Ear: External ear normal.  Left Ear: External ear normal.  Nose: Nose normal.  Mouth/Throat: Oropharynx is clear and moist.  Eyes: Conjunctivae and EOM are normal.  Neck: Normal range of motion. Neck supple. No JVD present. No thyromegaly present.  Cardiovascular: Normal rate, regular rhythm, normal heart sounds and intact distal pulses.   Pulmonary/Chest: Effort normal and breath sounds normal.  Abdominal: Soft. Bowel sounds are normal. She exhibits no distension and no mass. There is no tenderness. There is no rebound and no guarding.  Musculoskeletal: Normal range of motion. She exhibits no edema and no tenderness.  Lymphadenopathy:    She has no cervical adenopathy.  Neurological: She is alert and oriented to person, place, and time. No cranial nerve deficit.  Skin: Skin is warm and dry. No rash noted. No erythema. No pallor.  Psychiatric: She has a normal mood and affect. Her behavior is normal. Judgment and thought content normal.          Assessment & Plan:  1. Cholesterol- recheck labs, Need to eat healthier and exercise AD. 2. Cough ? Reflux vs Allergic rhinitis- Allegra OTC, increase H2o, allergy hygiene  explained. Add Zantac and Delsym continue allegra/ Mucinex, check labs

## 2013-10-25 ENCOUNTER — Encounter: Payer: Self-pay | Admitting: Emergency Medicine

## 2013-10-25 ENCOUNTER — Ambulatory Visit (INDEPENDENT_AMBULATORY_CARE_PROVIDER_SITE_OTHER): Payer: BC Managed Care – PPO | Admitting: Emergency Medicine

## 2013-10-25 VITALS — BP 102/64 | HR 78 | Temp 98.6°F | Resp 16 | Ht <= 58 in | Wt 143.0 lb

## 2013-10-25 DIAGNOSIS — R609 Edema, unspecified: Secondary | ICD-10-CM

## 2013-10-25 DIAGNOSIS — R7309 Other abnormal glucose: Secondary | ICD-10-CM

## 2013-10-25 DIAGNOSIS — R5381 Other malaise: Secondary | ICD-10-CM

## 2013-10-25 DIAGNOSIS — R5383 Other fatigue: Principal | ICD-10-CM

## 2013-10-25 NOTE — Patient Instructions (Signed)
Heart Failure Heart failure means your heart has trouble pumping blood. This makes it hard for your body to work well. Heart failure is usually a long-term (chronic) condition. You must take good care of yourself and follow your doctor's treatment plan. HOME CARE  Take your heart medicine as told by your doctor.  Do not stop taking medicine unless your doctor tells you to.  Do not skip any dose of medicine.  Refill your medicines before they run out.  Take other medicines only as told by your doctor or pharmacist.  Stay active if told by your doctor. The elderly and people with severe heart failure should talk with a doctor about physical activity.  Eat heart healthy foods. Choose foods that are without trans fat and are low in saturated fat, cholesterol, and salt (sodium). This includes fresh or frozen fruits and vegetables, fish, lean meats, fat-free or low-fat dairy foods, whole grains, and high-fiber foods. Lentils and dried peas and beans (legumes) are also good choices.  Limit salt if told by your doctor.  Cook in a healthy way. Roast, grill, broil, bake, poach, steam, or stir-fry foods.  Limit fluids as told by your doctor.  Weigh yourself every morning. Do this after you pee (urinate) and before you eat breakfast. Write down your weight to give to your doctor.  Take your blood pressure and write it down if your doctor tell you to.  Ask your doctor how to check your pulse. Check your pulse as told.  Lose weight if told by your doctor.  Stop smoking or chewing tobacco. Do not use gum or patches that help you quit without your doctor's approval.  Schedule and go to doctor visits as told.  Nonpregnant women should have no more than 1 drink a day. Men should have no more than 2 drinks a day. Talk to your doctor about drinking alcohol.  Stop illegal drug use.  Stay current with shots (immunizations).  Manage your health conditions as told by your doctor.  Learn to manage  your stress.  Rest when you are tired.  If it is really hot outside:  Avoid intense activities.  Use air conditioning or fans, or get in a cooler place.  Avoid caffeine and alcohol.  Wear loose-fitting, lightweight, and light-colored clothing.  If it is really cold outside:  Avoid intense activities.  Layer your clothing.  Wear mittens or gloves, a hat, and a scarf when going outside.  Avoid alcohol.  Learn about heart failure and get support as needed.  Get help to maintain or improve your quality of life and your ability to care for yourself as needed. GET HELP IF:   You gain 03 lb/1.4 kg or more in 1 day or 05 lb/2.3 kg in a week.  You are more short of breath than usual.  You cannot do your normal activities.  You tire easily.  You cough more than normal, especially with activity.  You have any or more puffiness (swelling) in areas such as your hands, feet, ankles, or belly (abdomen).  You cannot sleep because it is hard to breathe.  You feel like your heart is beating fast (palpitations).  You get dizzy or lightheaded when you stand up. GET HELP RIGHT AWAY IF:   You have trouble breathing.  There is a change in mental status, such as becoming less alert or not being able to focus.  You have chest pain or discomfort.  You faint. MAKE SURE YOU:   Understand these   instructions.  Will watch your condition.  Will get help right away if you are not doing well or get worse. Document Released: 04/14/2008 Document Revised: 10/31/2012 Document Reviewed: 02/04/2012 [employer] Patient Information 2014 [employer].  

## 2013-10-25 NOTE — Progress Notes (Signed)
Subjective:    Patient ID: Cathy Howard, female    DOB: [birth year], 38 y.o.   MRN: [id number]  HPI Comments: 38 yo female denies history of heart problems. She has had increased chocolate icing and squeeze jar of MAYO and 1/2 of CAKE at night. She binge eats in middle of night. She has had increased fatigue in daytime and increased lower extremity edema. She is difficult to waken in the morning. She has psych f/u this month for medication adjustment 10/31/13. Mother notes abilify worked better for night time sleep and decreased binge eating but raised her blood sugar. She is up 6 # since 09/27/13 She denies any other CHF symptoms.   Current Outpatient Prescriptions on File Prior to Visit  Medication Sig Dispense Refill  . albuterol (PROVENTIL HFA;VENTOLIN HFA) 108 (90 BASE) MCG/ACT inhaler Inhale 2 puffs into the lungs every 6 (six) hours as needed for wheezing or shortness of breath.  1 Inhaler  2  . amphetamine-dextroamphetamine (ADDERALL XR) 15 MG 24 hr capsule Take 15 mg by mouth every morning.      . Cholecalciferol (VITAMIN D3) 5000 UNITS CAPS Take 5,000 Units by mouth daily.      Marland Kitchen. FLUoxetine (PROZAC) 20 MG tablet Take 20 mg by mouth. Takes 2 pills daily = 40 mg daily      . Magnesium 250 MG TABS Take 250 mg by mouth daily.      Marland Kitchen. topiramate (TOPAMAX) 100 MG tablet Take 100 mg by mouth 2 (two) times daily.       No current facility-administered medications on file prior to visit.   Allergies  Allergen Reactions  . Maxitrol [Neomycin-Polymyxin-Dexameth]    Past Medical History  Diagnosis Date  . ADD (attention deficit disorder)   . Mental retardation   . OCD (obsessive compulsive disorder)   . Hyperlipidemia   . Unspecified vitamin D deficiency       Review of Systems  Constitutional: Positive for fatigue.  Cardiovascular: Positive for leg swelling.  Psychiatric/Behavioral: Positive for behavioral problems and sleep disturbance.  All other systems reviewed and are  negative.  BP 102/64  Pulse 78  Temp(Src) 98.6 F (37 C) (Oral)  Resp 16  Ht 4' 8.75" (1.441 m)  Wt 143 lb (64.864 kg)  BMI 31.24 kg/m2     Objective:   Physical Exam  Nursing note and vitals reviewed. Constitutional: She is oriented to person, place, and time. She appears well-developed and well-nourished. No distress.  HENT:  Head: Normocephalic and atraumatic.  Right Ear: External ear normal.  Left Ear: External ear normal.  Nose: Nose normal.  Mouth/Throat: Oropharynx is clear and moist.  Eyes: Conjunctivae and EOM are normal.  Neck: Normal range of motion. Neck supple. No JVD present. No thyromegaly present.  Cardiovascular: Normal rate, regular rhythm, normal heart sounds and intact distal pulses.   Non pitting edema bilateral LE  Pulmonary/Chest: Effort normal and breath sounds normal.  Abdominal: Soft. Bowel sounds are normal. She exhibits no distension and no mass. There is no tenderness. There is no rebound and no guarding.  Musculoskeletal: Normal range of motion. She exhibits no edema and no tenderness.  Lymphadenopathy:    She has no cervical adenopathy.  Neurological: She is alert and oriented to person, place, and time. No cranial nerve deficit.  Skin: Skin is warm and dry. No rash noted. No erythema. No pallor.  Right wrist with 3 X1 cm excoriations  Psychiatric: She has a normal mood and affect. Her  behavior is normal. Judgment and thought content normal.          Assessment & Plan:  1. ? Nocturnal eating of increased sodium causing Edema/ sleep deprivation/ Fatigue- check labs, increase activity and H2O. Advise psych f/u with restart of abilify with close monitoring of A1c. Lock cabinets and fridge   2. New wounds right wrist- mother aware of MRSA HX and has topical treatment at home, w/c with concerns.Reitterated stop picking.

## 2013-10-26 ENCOUNTER — Other Ambulatory Visit: Payer: Self-pay | Admitting: Emergency Medicine

## 2013-10-26 LAB — CBC WITH DIFFERENTIAL/PLATELET
BASOS ABS: 0 10*3/uL (ref 0.0–0.1)
BASOS PCT: 0 % (ref 0–1)
EOS PCT: 1 % (ref 0–5)
Eosinophils Absolute: 0.2 10*3/uL (ref 0.0–0.7)
HCT: 38.7 % (ref 36.0–46.0)
Hemoglobin: 12.6 g/dL (ref 12.0–15.0)
LYMPHS PCT: 6 % — AB (ref 12–46)
Lymphs Abs: 1 10*3/uL (ref 0.7–4.0)
MCH: 29.5 pg (ref 26.0–34.0)
MCHC: 32.6 g/dL (ref 30.0–36.0)
MCV: 90.6 fL (ref 78.0–100.0)
MONO ABS: 0.7 10*3/uL (ref 0.1–1.0)
Monocytes Relative: 4 % (ref 3–12)
Neutro Abs: 14.5 10*3/uL — ABNORMAL HIGH (ref 1.7–7.7)
Neutrophils Relative %: 89 % — ABNORMAL HIGH (ref 43–77)
PLATELETS: 317 10*3/uL (ref 150–400)
RBC: 4.27 MIL/uL (ref 3.87–5.11)
RDW: 13.7 % (ref 11.5–15.5)
WBC: 16.3 10*3/uL — AB (ref 4.0–10.5)

## 2013-10-26 LAB — BASIC METABOLIC PANEL WITH GFR
BUN: 12 mg/dL (ref 6–23)
CO2: 25 mEq/L (ref 19–32)
CREATININE: 0.61 mg/dL (ref 0.50–1.10)
Calcium: 9.6 mg/dL (ref 8.4–10.5)
Chloride: 105 mEq/L (ref 96–112)
GFR, Est African American: >89 mL/min
Glucose, Bld: 87 mg/dL (ref 70–99)
Potassium: 4.4 mEq/L (ref 3.5–5.3)
Sodium: 137 mEq/L (ref 135–145)

## 2013-10-26 LAB — HEMOGLOBIN A1C
HEMOGLOBIN A1C: 5.6 % (ref <5.7)
Mean Plasma Glucose: 114 mg/dL (ref <117)

## 2013-10-26 LAB — TSH: TSH: 1.005 u[IU]/mL (ref 0.350–4.500)

## 2013-10-26 LAB — BRAIN NATRIURETIC PEPTIDE: Brain Natriuretic Peptide: 58.8 pg/mL (ref 0.0–100.0)

## 2013-10-26 MED ORDER — DOXYCYCLINE HYCLATE 100 MG PO TABS: 100.0000 mg | ORAL_TABLET | Freq: Two times a day (BID) | ORAL | Status: DC

## 2014-01-08 ENCOUNTER — Encounter: Payer: Self-pay | Admitting: Emergency Medicine

## 2014-02-07 ENCOUNTER — Encounter: Payer: Self-pay | Admitting: Emergency Medicine

## 2014-02-19 ENCOUNTER — Encounter: Payer: Self-pay | Admitting: Emergency Medicine

## 2014-02-19 ENCOUNTER — Ambulatory Visit (INDEPENDENT_AMBULATORY_CARE_PROVIDER_SITE_OTHER): Payer: BC Managed Care – PPO | Admitting: Emergency Medicine

## 2014-02-19 VITALS — BP 124/72 | HR 58 | Temp 98.1°F | Resp 18 | Ht <= 58 in | Wt 136.4 lb

## 2014-02-19 DIAGNOSIS — Z0000 Encounter for general adult medical examination without abnormal findings: Secondary | ICD-10-CM

## 2014-02-19 DIAGNOSIS — E559 Vitamin D deficiency, unspecified: Secondary | ICD-10-CM

## 2014-02-19 DIAGNOSIS — Z111 Encounter for screening for respiratory tuberculosis: Secondary | ICD-10-CM

## 2014-02-19 DIAGNOSIS — Z79899 Other long term (current) drug therapy: Secondary | ICD-10-CM

## 2014-02-19 LAB — HEPATIC FUNCTION PANEL
ALT: 11 U/L (ref 0–35)
AST: 18 U/L (ref 0–37)
Albumin: 4.6 g/dL (ref 3.5–5.2)
Alkaline Phosphatase: 72 U/L (ref 39–117)
BILIRUBIN DIRECT: 0.1 mg/dL (ref 0.0–0.3)
BILIRUBIN INDIRECT: 0.3 mg/dL (ref 0.2–1.2)
TOTAL PROTEIN: 6.8 g/dL (ref 6.0–8.3)
Total Bilirubin: 0.4 mg/dL (ref 0.2–1.2)

## 2014-02-19 LAB — LIPID PANEL
Cholesterol: 190 mg/dL (ref 0–200)
HDL: 64 mg/dL (ref >39)
LDL CALC: 111 mg/dL — AB (ref 0–99)
TRIGLYCERIDES: 75 mg/dL (ref <150)
Total CHOL/HDL Ratio: 3 Ratio
VLDL: 15 mg/dL (ref 0–40)

## 2014-02-19 LAB — BASIC METABOLIC PANEL WITH GFR
BUN: 17 mg/dL (ref 6–23)
CHLORIDE: 101 meq/L (ref 96–112)
CO2: 25 meq/L (ref 19–32)
CREATININE: 0.58 mg/dL (ref 0.50–1.10)
Calcium: 9.4 mg/dL (ref 8.4–10.5)
GFR, Est African American: >89 mL/min
GFR, Est Non African American: >89 mL/min
Glucose, Bld: 77 mg/dL (ref 70–99)
POTASSIUM: 4.1 meq/L (ref 3.5–5.3)
Sodium: 136 mEq/L (ref 135–145)

## 2014-02-19 LAB — CBC WITH DIFFERENTIAL/PLATELET
BASOS ABS: 0 10*3/uL (ref 0.0–0.1)
BASOS PCT: 0 % (ref 0–1)
Eosinophils Absolute: 0.2 10*3/uL (ref 0.0–0.7)
Eosinophils Relative: 3 % (ref 0–5)
HEMATOCRIT: 41.5 % (ref 36.0–46.0)
Hemoglobin: 14.1 g/dL (ref 12.0–15.0)
LYMPHS PCT: 29 % (ref 12–46)
Lymphs Abs: 1.6 10*3/uL (ref 0.7–4.0)
MCH: 30.7 pg (ref 26.0–34.0)
MCHC: 34 g/dL (ref 30.0–36.0)
MCV: 90.2 fL (ref 78.0–100.0)
MONO ABS: 0.4 10*3/uL (ref 0.1–1.0)
Monocytes Relative: 8 % (ref 3–12)
NEUTROS ABS: 3.3 10*3/uL (ref 1.7–7.7)
Neutrophils Relative %: 60 % (ref 43–77)
PLATELETS: 238 10*3/uL (ref 150–400)
RBC: 4.6 MIL/uL (ref 3.87–5.11)
RDW: 12.9 % (ref 11.5–15.5)
WBC: 5.5 10*3/uL (ref 4.0–10.5)

## 2014-02-19 LAB — MAGNESIUM: MAGNESIUM: 1.8 mg/dL (ref 1.5–2.5)

## 2014-02-19 LAB — HEMOGLOBIN A1C
Hgb A1c MFr Bld: 5.9 % — ABNORMAL HIGH (ref <5.7)
Mean Plasma Glucose: 123 mg/dL — ABNORMAL HIGH (ref <117)

## 2014-02-19 LAB — TSH: TSH: 1.281 u[IU]/mL (ref 0.350–4.500)

## 2014-02-19 NOTE — Progress Notes (Signed)
Subjective:    Patient ID: Cathy Howard, female    DOB: 14-Dec-1975, 38 y.o.   MRN: [id number]  HPI Comments: [name] with MRDD for [name] and presents for 3 month F/U for Cholesterol, Pre-Dm, D. Deficient. Mom notes she is not exercising or eating healthy. She notes trying to improve her diet overall. She is still sleep walking/ eating despite medication changes by behavioral center. She notes she has more fatigue on the days after sleep walking is worse.   She has recent pelvic U/S for new onset of menses per Dr Cathy Howard which was NEG. Mom states he has diagnosed her with menopause. Mom notes 2 separate cycles May/ June of spotting but no cycle this month or any abdominal discomfort.  Mom notes + constipation with her poor diet/ lack of exercise and water intake.   WBC            16.3   10/25/2013 HGB            12.6   10/25/2013 HCT            38.7   10/25/2013 PLT             317   10/25/2013 GLUCOSE          87   10/25/2013 CHOL            191   09/27/2013 TRIG             96   09/27/2013 HDL              65   09/27/2013 LDLCALC         107   09/27/2013 ALT              12   09/27/2013 AST              20   09/27/2013 NA              137   10/25/2013 K               4.4   10/25/2013 CL              105   10/25/2013 CREATININE     0.61   10/25/2013 BUN              12   10/25/2013 CO2              25   10/25/2013 TSH           1.005   10/25/2013 HGBA1C          5.6   10/25/2013      Medication List       This list is accurate as of: 02/19/14  9:18 AM.  Always use your most recent med list.               albuterol 108 (90 BASE) MCG/ACT inhaler  Commonly known as:  PROVENTIL HFA;VENTOLIN HFA  Inhale 2 puffs into the lungs every 6 (six) hours as needed for wheezing or shortness of breath.     amphetamine-dextroamphetamine 15 MG 24 hr capsule  Commonly known as:  ADDERALL XR  Take 15 mg by mouth every morning.     doxycycline 100 MG tablet  Commonly known as:  VIBRA-TABS  Take 1 tablet (100 mg total) by  mouth 2 (two) times daily.     FLUoxetine 20 MG tablet  Commonly known as:  PROZAC  Take 20 mg by mouth. Takes 2 pills daily = 40 mg daily     Magnesium 250 MG Tabs  Take 250 mg by mouth daily.     topiramate 100 MG tablet  Commonly known as:  TOPAMAX  Take 100 mg by mouth 2 (two) times daily.     Vitamin D3 5000 UNITS Caps  Take 5,000 Units by mouth daily.       Allergies  Allergen Reactions  . Maxitrol [Neomycin-Polymyxin-Dexameth]    Past Medical History  Diagnosis Date  . ADD (attention deficit disorder)   . Mental retardation   . OCD (obsessive compulsive disorder)   . Hyperlipidemia   . Unspecified vitamin D deficiency    Past Surgical History  Procedure Laterality Date  . Hip surgery    . Scoliosis surgery    . Eye surgery     History  Substance Use Topics  . Smoking status: Never Smoker   . Smokeless tobacco: Not on file  . Alcohol Use: No   Family History  Problem Relation Age of Onset  . Cancer Mother     basal cell  . Stroke Maternal Grandmother   . Diabetes Maternal Grandmother    MAINTENANCE: Colonoscopy:n/a Mammo:declines BMD:n/a Pap/ Pelvic: Declines due to anatomy but recent pelvic U/S WNL [id number] Dentist:q 6 month  IMMUNIZATIONS: Tdap:02/13/13 Pneumovax:n/a Zostavax:n/a Influenza:  Patient Howard Team: Cathy Cowboy, MD as PCP - General (Internal Medicine) Cathy Plume, MD as Consulting Physician (Obstetrics and Gynecology) Cathy Carne, DO (Optometry) Cathy Howard, (Dentist) Cathy Howard, (ADD/ Behavioral)  Review of Systems  Constitutional: Positive for fatigue.  Gastrointestinal: Positive for constipation.  Psychiatric/Behavioral: Positive for sleep disturbance.  All other systems reviewed and are negative.  BP 124/72  Pulse 58  Temp(Src) 98.1 F (36.7 C)  Resp 18  Ht 4' 8.75" (1.441 m)  Wt 136 lb 6.4 oz (61.871 kg)  BMI 29.80 kg/m2  SpO2 96%      Objective:   Physical Exam  Nursing  note and vitals reviewed. Constitutional: She is oriented to person, place, and time. She appears well-developed and well-nourished. No distress.  overweight  HENT:  Head: Normocephalic and atraumatic.  Right Ear: External ear normal.  Left Ear: External ear normal.  Nose: Nose normal.  Mouth/Throat: Oropharynx is clear and moist.  Poor dentition, grinding teeth during exam for attention  Eyes: Conjunctivae and EOM are normal. Pupils are equal, round, and reactive to light. Right eye exhibits no discharge. Left eye exhibits no discharge. No scleral icterus.  Neck: Normal range of motion. Neck supple. No JVD present. No tracheal deviation present. No thyromegaly present.  Cardiovascular: Normal rate, regular rhythm, normal heart sounds and intact distal pulses.   Pulmonary/Chest: Effort normal and breath sounds normal.  Abdominal: Soft. Bowel sounds are normal. She exhibits no distension and no mass. There is no tenderness. There is no rebound and no guarding.  Genitourinary:  Def GYN  Musculoskeletal: Normal range of motion. She exhibits no edema and no tenderness.       Hands: + scoliosis  Lymphadenopathy:    She has no cervical adenopathy.  Neurological: She is alert and oriented to person, place, and time. She has normal reflexes. No cranial nerve deficit. She exhibits normal muscle tone. Coordination normal.  Skin: Skin is warm and dry. No rash noted. No erythema. No pallor.  Right hand 1 cm circular lesion with mild erythema at base  Psychiatric:  Cathy Howard was not as well  behaved today as normal visit with constant grinding of teeth, interrupting of conversation and failing to follow instructions from mom.       Assessment & Plan:  1. [name]- Update screening labs/ History/ Immunizations/ Testing as needed. Advised healthy diet, QD exercise, increase H20 and continue RX/ Vitamins AD.  2.  3 month F/U for Cholesterol, Pre-Dm, D. Deficient. Needs healthy diet, cardio QD and obtain healthy  weight. Check Labs, Check BP if >130/80 call office  3. Behavioral problems with sleep eating- Advise make her have 30 minutes cardio each evening and protein snack before bed to see if any change.   4. Constipation- Increase fiber/ water intake, decrease caffeine, increase activity level, can add Miralax or Metamucil QD until BM soft   5. Hand lesion- continue to monitor closely with picking/ MRSA history. w/c if SX increase or ER.

## 2014-02-19 NOTE — Patient Instructions (Signed)
Fat and Cholesterol Control Diet Fat and cholesterol levels in your blood and organs are influenced by your diet. High levels of fat and cholesterol may lead to diseases of the heart, small and large blood vessels, gallbladder, liver, and pancreas. CONTROLLING FAT AND CHOLESTEROL WITH DIET Although exercise and lifestyle factors are important, your diet is key. That is because certain foods are known to raise cholesterol and others to lower it. The goal is to balance foods for their effect on cholesterol and more importantly, to replace saturated and trans fat with other types of fat, such as monounsaturated fat, polyunsaturated fat, and omega-3 fatty acids. On average, a person should consume no more than 15 to 17 g of saturated fat daily. Saturated and trans fats are considered "bad" fats, and they will raise LDL cholesterol. Saturated fats are primarily found in animal products such as meats, butter, and cream. However, that does not mean you need to give up all your favorite foods. Today, there are good tasting, low-fat, low-cholesterol substitutes for most of the things you like to eat. Choose low-fat or nonfat alternatives. Choose round or loin cuts of red meat. These types of cuts are lowest in fat and cholesterol. Chicken (without the skin), fish, veal, and ground turkey breast are great choices. Eliminate fatty meats, such as hot dogs and salami. Even shellfish have little or no saturated fat. Have a 3 oz (85 g) portion when you eat lean meat, poultry, or fish. Trans fats are also called "partially hydrogenated oils." They are oils that have been scientifically manipulated so that they are solid at room temperature resulting in a longer shelf life and improved taste and texture of foods in which they are added. Trans fats are found in stick margarine, some tub margarines, cookies, crackers, and baked goods.  When baking and cooking, oils are a great substitute for butter. The monounsaturated oils are  especially beneficial since it is believed they lower LDL and raise HDL. The oils you should avoid entirely are saturated tropical oils, such as coconut and palm.  Remember to eat a lot from food groups that are naturally free of saturated and trans fat, including fish, fruit, vegetables, beans, grains (barley, rice, couscous, bulgur wheat), and pasta (without cream sauces).  IDENTIFYING FOODS THAT LOWER FAT AND CHOLESTEROL  Soluble fiber may lower your cholesterol. This type of fiber is found in fruits such as apples, vegetables such as broccoli, potatoes, and carrots, legumes such as beans, peas, and lentils, and grains such as barley. Foods fortified with plant sterols (phytosterol) may also lower cholesterol. You should eat at least 2 g per day of these foods for a cholesterol lowering effect.  Read package labels to identify low-saturated fats, trans fat free, and low-fat foods at the supermarket. Select cheeses that have only 2 to 3 g saturated fat per ounce. Use a heart-healthy tub margarine that is free of trans fats or partially hydrogenated oil. When buying baked goods (cookies, crackers), avoid partially hydrogenated oils. Breads and muffins should be made from whole grains (whole-wheat or whole oat flour, instead of "flour" or "enriched flour"). Buy non-creamy canned soups with reduced salt and no added fats.  FOOD PREPARATION TECHNIQUES  Never deep-fry. If you must fry, either stir-fry, which uses very little fat, or use non-stick cooking sprays. When possible, broil, bake, or roast meats, and steam vegetables. Instead of putting butter or margarine on vegetables, use lemon and herbs, applesauce, and cinnamon (for squash and sweet potatoes). Use nonfat   yogurt, salsa, and low-fat dressings for salads.  LOW-SATURATED FAT / LOW-FAT FOOD SUBSTITUTES Meats / Saturated Fat (g)  Avoid: Steak, marbled (3 oz/85 g) / 11 g  Choose: Steak, lean (3 oz/85 g) / 4 g  Avoid: Hamburger (3 oz/85 g) / 7  g  Choose: Hamburger, lean (3 oz/85 g) / 5 g  Avoid: Ham (3 oz/85 g) / 6 g  Choose: Ham, lean cut (3 oz/85 g) / 2.4 g  Avoid: Chicken, with skin, dark meat (3 oz/85 g) / 4 g  Choose: Chicken, skin removed, dark meat (3 oz/85 g) / 2 g  Avoid: Chicken, with skin, light meat (3 oz/85 g) / 2.5 g  Choose: Chicken, skin removed, light meat (3 oz/85 g) / 1 g Dairy / Saturated Fat (g)  Avoid: Whole milk (1 cup) / 5 g  Choose: Low-fat milk, 2% (1 cup) / 3 g  Choose: Low-fat milk, 1% (1 cup) / 1.5 g  Choose: Skim milk (1 cup) / 0.3 g  Avoid: Hard cheese (1 oz/28 g) / 6 g  Choose: Skim milk cheese (1 oz/28 g) / 2 to 3 g  Avoid: Cottage cheese, 4% fat (1 cup) / 6.5 g  Choose: Low-fat cottage cheese, 1% fat (1 cup) / 1.5 g  Avoid: Ice cream (1 cup) / 9 g  Choose: Sherbet (1 cup) / 2.5 g  Choose: Nonfat frozen yogurt (1 cup) / 0.3 g  Choose: Frozen fruit bar / trace  Avoid: Whipped cream (1 tbs) / 3.5 g  Choose: Nondairy whipped topping (1 tbs) / 1 g Condiments / Saturated Fat (g)  Avoid: Mayonnaise (1 tbs) / 2 g  Choose: Low-fat mayonnaise (1 tbs) / 1 g  Avoid: Butter (1 tbs) / 7 g  Choose: Extra light margarine (1 tbs) / 1 g  Avoid: Coconut oil (1 tbs) / 11.8 g  Choose: Olive oil (1 tbs) / 1.8 g  Choose: Corn oil (1 tbs) / 1.7 g  Choose: Safflower oil (1 tbs) / 1.2 g  Choose: Sunflower oil (1 tbs) / 1.4 g  Choose: Soybean oil (1 tbs) / 2.4 g  Choose: Canola oil (1 tbs) / 1 g Document Released: 07/06/2005 Document Revised: 10/31/2012 Document Reviewed: 10/04/2013 ExitCare Patient Information 2015 [employer]. This information is not intended to replace advice given to you by your health care provider. Make sure you discuss any questions you have with your health care provider.  

## 2014-02-20 LAB — URINALYSIS, ROUTINE W REFLEX MICROSCOPIC
Bilirubin Urine: NEGATIVE
Glucose, UA: NEGATIVE mg/dL
HGB URINE DIPSTICK: NEGATIVE
KETONES UR: NEGATIVE mg/dL
Leukocytes, UA: NEGATIVE
NITRITE: NEGATIVE
PH: 7 (ref 5.0–8.0)
PROTEIN: NEGATIVE mg/dL
Specific Gravity, Urine: 1.011 (ref 1.005–1.030)
Urobilinogen, UA: 1 mg/dL (ref 0.0–1.0)

## 2014-02-20 LAB — MICROALBUMIN / CREATININE URINE RATIO
Creatinine, Urine: 40.1 mg/dL
MICROALB/CREAT RATIO: 12.5 mg/g (ref 0.0–30.0)
Microalb, Ur: 0.5 mg/dL (ref 0.00–1.89)

## 2014-02-20 LAB — INSULIN, FASTING: INSULIN FASTING, SERUM: 4 u[IU]/mL (ref 3–28)

## 2014-02-20 LAB — VITAMIN D 25 HYDROXY (VIT D DEFICIENCY, FRACTURES): Vit D, 25-Hydroxy: 25 ng/mL — ABNORMAL LOW (ref 30–89)

## 2014-02-21 ENCOUNTER — Encounter: Payer: Self-pay | Admitting: Physician Assistant

## 2014-02-22 LAB — TB SKIN TEST
Induration: 0 mm
TB Skin Test: NEGATIVE

## 2014-05-28 ENCOUNTER — Ambulatory Visit (INDEPENDENT_AMBULATORY_CARE_PROVIDER_SITE_OTHER): Payer: BC Managed Care – PPO | Admitting: Physician Assistant

## 2014-05-28 ENCOUNTER — Encounter: Payer: Self-pay | Admitting: Physician Assistant

## 2014-05-28 VITALS — BP 122/70 | HR 64 | Temp 97.7°F | Resp 16 | Wt 137.0 lb

## 2014-05-28 DIAGNOSIS — Z79899 Other long term (current) drug therapy: Secondary | ICD-10-CM

## 2014-05-28 DIAGNOSIS — E785 Hyperlipidemia, unspecified: Secondary | ICD-10-CM

## 2014-05-28 DIAGNOSIS — E559 Vitamin D deficiency, unspecified: Secondary | ICD-10-CM

## 2014-05-28 DIAGNOSIS — R7309 Other abnormal glucose: Secondary | ICD-10-CM

## 2014-05-28 DIAGNOSIS — N644 Mastodynia: Secondary | ICD-10-CM

## 2014-05-28 DIAGNOSIS — R7303 Prediabetes: Secondary | ICD-10-CM

## 2014-05-28 MED ORDER — MUPIROCIN 2 % EX OINT: 1.0000 "application " | TOPICAL_OINTMENT | Freq: Two times a day (BID) | CUTANEOUS | Status: DC

## 2014-05-28 NOTE — Patient Instructions (Signed)
Fibrocystic Breast Changes Fibrocystic breast changes occur when breast ducts become blocked, causing painful, fluid-filled lumps (cysts) to form in the breast. This is a common condition that is noncancerous (benign). It occurs when women go through hormonal changes during their menstrual cycle. Fibrocystic breast changes can affect one or both breasts. CAUSES  The exact cause of fibrocystic breast changes is not known, but it may be related to the female hormones estrogen and progesterone. Family traits that get passed from parent to child (genetics) may also be a factor in some cases. SIGNS AND SYMPTOMS   Tenderness, mild discomfort, or pain.   Swelling.   Ropelike feeling when touching the breast.   Lumpy breast, one or both sides.   Changes in breast size, especially before (larger) and after (smaller) the menstrual period.   Green or dark brown nipple discharge (not blood).  Symptoms are usually worse before menstrual periods start and get better toward the end of the menstrual period.  DIAGNOSIS  To make a diagnosis, your health care provider will ask you questions and perform a physical exam of your breasts. The health care provider may recommend other tests that can examine inside your breasts, such as:  A breast X-ray (mammogram).   Ultrasonography.  An MRI.  If something more than fibrocystic breast changes is suspected, your health care provider may take a breast tissue sample (breast biopsy) to examine. TREATMENT  Often, treatment is not needed. Your health care provider may recommend over-the-counter pain relievers to help lessen pain or discomfort caused by the fibrocystic breast changes. You may also be asked to change your diet to limit or stop eating foods or drinking beverages that contain caffeine. Foods and beverages that contain caffeine include chocolate, soda, coffee, and tea. Reducing sugar and fat in your diet may also help. Your health care provider may  also recommend:  Fine needle aspiration to remove fluid from a cyst that is causing pain.   Surgery to remove a large, persistent, and tender cyst. HOME CARE INSTRUCTIONS   Examine your breasts after every menstrual period. If you do not have menstrual periods, check your breasts the first day of every month. Feel for changes, such as more tenderness, a new growth, a change in breast size, or a change in a lump that has always been there.   Only take over-the-counter or prescription medicine as directed by your health care provider.   Wear a well-fitted support or sports bra, especially when exercising.   Decrease or avoid caffeine, fat, and sugar in your diet as directed by your health care provider.  SEEK MEDICAL CARE IF:   You have fluid leaking (discharge) from your nipples, especially bloody discharge.   You have new lumps or bumps in the breast.   Your breast or breasts become enlarged, red, and painful.   You have areas of your breast that pucker in.   Your nipples appear flat or indented.  Document Released: 04/22/2006 Document Revised: 07/11/2013 Document Reviewed: 12/25/2012 ExitCare Patient Information 2015 [employer]. This information is not intended to replace advice given to you by your health care provider. Make sure you discuss any questions you have with your health care provider.  

## 2014-05-28 NOTE — Progress Notes (Signed)
Assessment and Plan:  Hypertension: Continue medication, monitor blood pressure at home. Continue DASH diet.  Reminder to go to the ER if any CP, SOB, nausea, dizziness, severe HA, changes vision/speech, left arm numbness and tingling, and jaw pain. Cholesterol: Continue diet and exercise. Check cholesterol.  Pre-diabetes-Continue diet and exercise. Check A1C Vitamin D Def- check level and continue medications.  Right breast- no nipple discharge, + mobile tender nodules- will decrease salt, sugar, caffeine and reevaluate next visit, if still a problem will get diagnostic MGM.   Continue diet and meds as discussed. Further disposition pending results of labs.  HPI 38 y.o. female  presents for 3 month follow up with hypertension, hyperlipidemia, prediabetes and vitamin D. Her blood pressure has been controlled at home, today their BP is BP: 122/70 mmHg She does not workout. She denies chest pain, shortness of breath, dizziness.  She is not on cholesterol medication and denies myalgias. Her cholesterol is at goal. The cholesterol last visit was:   Lab Results  Component Value Date   CHOL 190 02/19/2014   HDL 64 02/19/2014   LDLCALC 111* 02/19/2014   TRIG 75 02/19/2014   CHOLHDL 3.0 02/19/2014   She has been working on diet and exercise for prediabetes, and denies paresthesia of the feet, polydipsia, polyuria and visual disturbances. Last A1C in the office was:  Lab Results  Component Value Date   HGBA1C 5.9* 02/19/2014   Patient is on Vitamin D supplement.   Lab Results  Component Value Date   VD25OH 25* 02/19/2014     She told her mother on Wednesday that she had " picked" at a spot on her right nipple, states there is red/white color to it and swollen.   Current Medications:  Current Outpatient Prescriptions on File Prior to Visit  Medication Sig Dispense Refill  . amphetamine-dextroamphetamine (ADDERALL XR) 15 MG 24 hr capsule Take 15 mg by mouth every morning.    .  Cholecalciferol (VITAMIN D3) 5000 UNITS CAPS Take 5,000 Units by mouth daily.    Marland Kitchen. FLUoxetine (PROZAC) 20 MG tablet Take 20 mg by mouth. Takes 2 pills daily = 40 mg daily    . Magnesium 250 MG TABS Take 250 mg by mouth daily.    Marland Kitchen. topiramate (TOPAMAX) 100 MG tablet Take 100 mg by mouth 2 (two) times daily.     No current facility-administered medications on file prior to visit.   Medical History:  Past Medical History  Diagnosis Date  . ADD (attention deficit disorder)   . Mental retardation   . OCD (obsessive compulsive disorder)   . Hyperlipidemia   . Unspecified vitamin D deficiency    Allergies:  Allergies  Allergen Reactions  . Maxitrol [Neomycin-Polymyxin-Dexameth]    Review of Systems: see HPI  Family history- Review and unchanged Social history- Review and unchanged Physical Exam: BP 122/70 mmHg  Pulse 64  Temp(Src) 97.7 F (36.5 C)  Resp 16  Wt 137 lb (62.143 kg) Wt Readings from Last 3 Encounters:  05/28/14 137 lb (62.143 kg)  02/19/14 136 lb 6.4 oz (61.871 kg)  10/25/13 143 lb (64.864 kg)   General Appearance: Well nourished, in no apparent distress. Eyes: PERRLA, EOMs, conjunctiva no swelling or erythema Sinuses: No Frontal/maxillary tenderness ENT/Mouth: Ext aud canals clear, TMs without erythema, bulging. No erythema, swelling, or exudate on post pharynx.  Tonsils not swollen or erythematous. Poor dentition Neck: Supple, thyroid normal. Webbed neck.  Respiratory: Respiratory effort normal, BS equal bilaterally without rales, rhonchi, wheezing  or stridor.  Cardio: RRR with no MRGs. Brisk peripheral pulses without edema.  Abdomen: Soft, + BS, obese, Non tender, no guarding, rebound, hernias, masses. Breasts: bilateral retracted nipples, symmetric fibrous changes in both upper outer quadrants and right breast with mobile mass tender at 9 o clock and mobile pea size at 3 oclock, no discharge, . Lymphatics: Non tender without lymphadenopathy.  Musculoskeletal:  Full ROM, 5/5 strength, normal gait.  Skin: Warm, dry without rashes, lesions, ecchymosis.  Neuro: Cranial nerves intact. Normal muscle tone, no cerebellar symptoms. Sensation intact.  Psych: Awake and oriented X 3, normal affect, Insight and Judgment appropriate.    Quentin Mullingollier, Minh Roanhorse, PA-C 4:37 PM [facility]

## 2014-05-29 LAB — TSH: TSH: 1.53 u[IU]/mL (ref 0.350–4.500)

## 2014-05-29 LAB — CBC WITH DIFFERENTIAL/PLATELET
Basophils Absolute: 0 10*3/uL (ref 0.0–0.1)
Basophils Relative: 1 % (ref 0–1)
Eosinophils Absolute: 0.1 10*3/uL (ref 0.0–0.7)
Eosinophils Relative: 3 % (ref 0–5)
HCT: 41 % (ref 36.0–46.0)
HEMOGLOBIN: 13.4 g/dL (ref 12.0–15.0)
LYMPHS ABS: 1.2 10*3/uL (ref 0.7–4.0)
LYMPHS PCT: 25 % (ref 12–46)
MCH: 30.6 pg (ref 26.0–34.0)
MCHC: 32.7 g/dL (ref 30.0–36.0)
MCV: 93.6 fL (ref 78.0–100.0)
MONOS PCT: 8 % (ref 3–12)
Monocytes Absolute: 0.4 10*3/uL (ref 0.1–1.0)
NEUTROS PCT: 63 % (ref 43–77)
Neutro Abs: 3.1 10*3/uL (ref 1.7–7.7)
Platelets: 300 10*3/uL (ref 150–400)
RBC: 4.38 MIL/uL (ref 3.87–5.11)
RDW: 13.1 % (ref 11.5–15.5)
WBC: 4.9 10*3/uL (ref 4.0–10.5)

## 2014-05-29 LAB — HEPATIC FUNCTION PANEL
ALT: 9 U/L (ref 0–35)
AST: 17 U/L (ref 0–37)
Albumin: 4.2 g/dL (ref 3.5–5.2)
Alkaline Phosphatase: 71 U/L (ref 39–117)
BILIRUBIN DIRECT: 0.1 mg/dL (ref 0.0–0.3)
BILIRUBIN INDIRECT: 0.2 mg/dL (ref 0.2–1.2)
BILIRUBIN TOTAL: 0.3 mg/dL (ref 0.2–1.2)
Total Protein: 6.3 g/dL (ref 6.0–8.3)

## 2014-05-29 LAB — BASIC METABOLIC PANEL WITH GFR
BUN: 12 mg/dL (ref 6–23)
CHLORIDE: 104 meq/L (ref 96–112)
CO2: 24 mEq/L (ref 19–32)
Calcium: 10.1 mg/dL (ref 8.4–10.5)
Creat: 0.49 mg/dL — ABNORMAL LOW (ref 0.50–1.10)
GFR, Est African American: >89 mL/min
GFR, Est Non African American: >89 mL/min
Glucose, Bld: 72 mg/dL (ref 70–99)
POTASSIUM: 5.1 meq/L (ref 3.5–5.3)
SODIUM: 139 meq/L (ref 135–145)

## 2014-05-29 LAB — MAGNESIUM: Magnesium: 1.8 mg/dL (ref 1.5–2.5)

## 2014-05-29 LAB — LIPID PANEL
CHOLESTEROL: 182 mg/dL (ref 0–200)
HDL: 68 mg/dL (ref >39)
LDL CALC: 102 mg/dL — AB (ref 0–99)
Total CHOL/HDL Ratio: 2.7 Ratio
Triglycerides: 61 mg/dL (ref <150)
VLDL: 12 mg/dL (ref 0–40)

## 2014-05-29 LAB — VITAMIN D 25 HYDROXY (VIT D DEFICIENCY, FRACTURES): Vit D, 25-Hydroxy: 14 ng/mL — ABNORMAL LOW (ref 30–89)

## 2014-05-29 LAB — HEMOGLOBIN A1C
Hgb A1c MFr Bld: 5.6 % (ref <5.7)
Mean Plasma Glucose: 114 mg/dL (ref <117)

## 2014-06-11 ENCOUNTER — Ambulatory Visit: Payer: Self-pay | Admitting: Physician Assistant

## 2014-09-04 ENCOUNTER — Ambulatory Visit: Payer: Self-pay | Admitting: Physician Assistant

## 2014-09-05 ENCOUNTER — Ambulatory Visit: Payer: Self-pay | Admitting: Physician Assistant

## 2014-09-10 ENCOUNTER — Ambulatory Visit: Payer: Self-pay | Admitting: Physician Assistant

## 2015-01-07 ENCOUNTER — Ambulatory Visit (INDEPENDENT_AMBULATORY_CARE_PROVIDER_SITE_OTHER): Payer: BLUE CROSS/BLUE SHIELD | Admitting: Internal Medicine

## 2015-01-07 ENCOUNTER — Encounter: Payer: Self-pay | Admitting: Internal Medicine

## 2015-01-07 VITALS — BP 118/66 | HR 76 | Temp 99.0°F | Resp 18 | Ht <= 58 in | Wt 131.0 lb

## 2015-01-07 DIAGNOSIS — J019 Acute sinusitis, unspecified: Secondary | ICD-10-CM

## 2015-01-07 MED ORDER — AZITHROMYCIN 250 MG PO TABS: ORAL_TABLET | ORAL | Status: DC

## 2015-01-07 MED ORDER — BENZONATATE 100 MG PO CAPS: 100.0000 mg | ORAL_CAPSULE | Freq: Four times a day (QID) | ORAL | Status: DC | PRN

## 2015-01-07 MED ORDER — PREDNISONE 20 MG PO TABS: ORAL_TABLET | ORAL | Status: DC

## 2015-01-07 NOTE — Progress Notes (Signed)
Patient ID: Cathy Howard, female   DOB: April 19, 1976, 39 y.o.   MRN: [id number]  HPI  Patient presents to the office for evaluation of cough.  It has been going on for 2 days.  Patient reports all the time, dry, worse with lying down.  They also endorse change in voice, chills, fever, postnasal drip and sinus headache, nasal congestion..  They have tried tylenol and mucinex.  They report that nothing has worked.  They denies other sick contacts.  She does have seasonal allergies which can make her have some sinus infections per her mother.    Review of Systems  Constitutional: Positive for fever, chills and malaise/fatigue.  HENT: Positive for congestion and sore throat. Negative for ear pain.   Eyes: Negative.   Respiratory: Positive for cough. Negative for sputum production and shortness of breath.   Cardiovascular: Negative for chest pain, palpitations and leg swelling.  Neurological: Negative for headaches.    PE:  General:  Alert and non-toxic, WDWN, NAD HEENT: NCAT, PERLA, EOM normal, no occular discharge or erythema.  Nasal mucosal edema with sinus tenderness to palpation.  Oropharynx clear with minimal oropharyngeal edema and erythema.  Mucous membranes moist and pink. Neck:  Cervical adenopathy Chest:  RRR no MRGs.  Lungs clear to auscultation A&P with no wheezes rhonchi or rales.   Abdomen: +BS x 4 quadrants, soft, non-tender, no guarding, rigidity, or rebound. Skin: warm and dry no rash Neuro: A&Ox4, CN II-XII grossly intact  Assessment and Plan:   1. Acute sinusitis, recurrence not specified, unspecified location -zpak -prednisone -mucinex -loratidine -nasal saline -call if not better x 1 week

## 2015-01-07 NOTE — Patient Instructions (Signed)
Sinus Headache °A sinus headache is when your sinuses become clogged or swollen. Sinus headaches can range from mild to severe.  °CAUSES °A sinus headache can have different causes, such as: °· Colds. °· Sinus infections. °· Allergies. °SYMPTOMS  °Symptoms of a sinus headache may vary and can include: °· Headache. °· Pain or pressure in the face. °· Congested or runny nose. °· Fever. °· Inability to smell. °· Pain in upper teeth. °Weather changes can make symptoms worse. °TREATMENT  °The treatment of a sinus headache depends on the cause. °· Sinus pain caused by a sinus infection may be treated with antibiotic medicine. °· Sinus pain caused by allergies may be helped by allergy medicines (antihistamines) and medicated nasal sprays. °· Sinus pain caused by congestion may be helped by flushing the nose and sinuses with saline solution. °HOME CARE INSTRUCTIONS  °· If antibiotics are prescribed, take them as directed. Finish them even if you start to feel better. °· Only take over-the-counter or prescription medicines for pain, discomfort, or fever as directed by your caregiver. °· If you have congestion, use a nasal spray to help reduce pressure. °SEEK IMMEDIATE MEDICAL CARE IF: °· You have a fever. °· You have headaches more than once a week. °· You have sensitivity to light or sound. °· You have repeated nausea and vomiting. °· You have vision problems. °· You have sudden, severe pain in your face or head. °· You have a seizure. °· You are confused. °· Your sinus headaches do not get better after treatment. Many people think they have a sinus headache when they actually have migraines or tension headaches. °MAKE SURE YOU:  °· Understand these instructions. °· Will watch your condition. °· Will get help right away if you are not doing well or get worse. °Document Released: 08/13/2004 Document Revised: 09/28/2011 Document Reviewed: 10/04/2010 °Exit[employer]® Patient Information ©2015 [employer]. This information is not  intended to replace advice given to you by your health care provider. Make sure you discuss any questions you have with your health care provider. ° °

## 2015-02-20 ENCOUNTER — Encounter: Payer: Self-pay | Admitting: Emergency Medicine

## 2015-04-11 ENCOUNTER — Encounter: Payer: Self-pay | Admitting: Internal Medicine

## 2015-04-15 ENCOUNTER — Encounter: Payer: Self-pay | Admitting: Internal Medicine

## 2015-04-15 ENCOUNTER — Ambulatory Visit (INDEPENDENT_AMBULATORY_CARE_PROVIDER_SITE_OTHER): Payer: BLUE CROSS/BLUE SHIELD | Admitting: Internal Medicine

## 2015-04-15 VITALS — BP 110/78 | HR 76 | Temp 98.0°F | Resp 16 | Ht <= 58 in | Wt 138.0 lb

## 2015-04-15 DIAGNOSIS — E559 Vitamin D deficiency, unspecified: Secondary | ICD-10-CM | POA: Diagnosis not present

## 2015-04-15 DIAGNOSIS — Z1329 Encounter for screening for other suspected endocrine disorder: Secondary | ICD-10-CM

## 2015-04-15 DIAGNOSIS — Z23 Encounter for immunization: Secondary | ICD-10-CM | POA: Diagnosis not present

## 2015-04-15 DIAGNOSIS — F988 Other specified behavioral and emotional disorders with onset usually occurring in childhood and adolescence: Secondary | ICD-10-CM

## 2015-04-15 DIAGNOSIS — Z130 Encounter for screening for diseases of the blood and blood-forming organs and certain disorders involving the immune mechanism: Secondary | ICD-10-CM

## 2015-04-15 DIAGNOSIS — Z0000 Encounter for general adult medical examination without abnormal findings: Secondary | ICD-10-CM

## 2015-04-15 DIAGNOSIS — Z79899 Other long term (current) drug therapy: Secondary | ICD-10-CM | POA: Diagnosis not present

## 2015-04-15 DIAGNOSIS — Z1389 Encounter for screening for other disorder: Secondary | ICD-10-CM

## 2015-04-15 DIAGNOSIS — F79 Unspecified intellectual disabilities: Secondary | ICD-10-CM

## 2015-04-15 DIAGNOSIS — E785 Hyperlipidemia, unspecified: Secondary | ICD-10-CM

## 2015-04-15 DIAGNOSIS — Z131 Encounter for screening for diabetes mellitus: Secondary | ICD-10-CM

## 2015-04-15 LAB — CBC WITH DIFFERENTIAL/PLATELET
BASOS PCT: 0 % (ref 0–1)
Basophils Absolute: 0 10*3/uL (ref 0.0–0.1)
Eosinophils Absolute: 0.2 10*3/uL (ref 0.0–0.7)
Eosinophils Relative: 3 % (ref 0–5)
HCT: 39.8 % (ref 36.0–46.0)
HEMOGLOBIN: 13.1 g/dL (ref 12.0–15.0)
Lymphocytes Relative: 16 % (ref 12–46)
Lymphs Abs: 1.1 10*3/uL (ref 0.7–4.0)
MCH: 30.7 pg (ref 26.0–34.0)
MCHC: 32.9 g/dL (ref 30.0–36.0)
MCV: 93.2 fL (ref 78.0–100.0)
MPV: 8.6 fL (ref 8.6–12.4)
Monocytes Absolute: 0.5 10*3/uL (ref 0.1–1.0)
Monocytes Relative: 8 % (ref 3–12)
NEUTROS PCT: 73 % (ref 43–77)
Neutro Abs: 5 10*3/uL (ref 1.7–7.7)
Platelets: 290 10*3/uL (ref 150–400)
RBC: 4.27 MIL/uL (ref 3.87–5.11)
RDW: 12.9 % (ref 11.5–15.5)
WBC: 6.8 10*3/uL (ref 4.0–10.5)

## 2015-04-15 NOTE — Progress Notes (Signed)
Patient ID: Cathy Howard, female   DOB: Jan 07, 1976, 39 y.o.   MRN: [id number]  Complete Physical  Assessment and Plan:   1. Vitamin D deficiency Cont vit D - Vit D  25 hydroxy (rtn osteoporosis monitoring)  2. ADD (attention deficit disorder) -managed by outside facility  3. Mental retardation -managed by outside facility  4. Hyperlipidemia -diet and exercise -lock fridge at nightime - Lipid panel  5. Routine general medical examination at a health care facility  - CBC with Differential/Platelet - BASIC METABOLIC PANEL WITH GFR - Hepatic function panel - Magnesium - Lipid panel - Hemoglobin A1c - Insulin, random - Iron and TIBC - Vitamin B12 - Urinalysis, Routine w reflex microscopic (not at [facility]) - Microalbumin / creatinine urine ratio - Vit D  25 hydroxy (rtn osteoporosis monitoring) - TSH  6. Medication management  - CBC with Differential/Platelet - BASIC METABOLIC PANEL WITH GFR - Hepatic function panel - Magnesium  7. Screening for diabetes mellitus  - Hemoglobin A1c - Insulin, random  8. Screening for deficiency anemia  - Iron and TIBC - Vitamin B12  9. Screening for hematuria or proteinuria  - Urinalysis, Routine w reflex microscopic (not at [facility]) - Microalbumin / creatinine urine ratio  10. Screening for thyroid disorder  - TSH    Discussed med's effects and SE's. Screening labs and tests as requested with regular follow-up as recommended.  HPI  39 y.o. female  presents for a complete physical.  Her blood pressure has been controlled at home, today their BP is BP: 110/78 mmHg.  She does not workout. She denies chest pain, shortness of breath, dizziness. She does not do any exercise right now, but she is going to start doing a walking routine with her mother and her mother would like to start her in an exercise class.    She is not on cholesterol medication and denies myalgias. Her cholesterol is at goal. The cholesterol last visit was:   Lab Results  Component Value Date   CHOL 182 05/28/2014   HDL 68 05/28/2014   LDLCALC 102* 05/28/2014   TRIG 61 05/28/2014   CHOLHDL 2.7 05/28/2014  .  She has been working on diet and exercise for prediabetes, she is on bASA, she is not on ACE/ARB and denies foot ulcerations, hyperglycemia, hypoglycemia , increased appetite, nausea, paresthesia of the feet, polydipsia, polyuria, visual disturbances, vomiting and weight loss. Last A1C in the office was:  Lab Results  Component Value Date   HGBA1C 5.6 05/28/2014    Patient is on Vitamin D supplement.   Lab Results  Component Value Date   VD25OH 14* 05/28/2014      She has been seeing NP Granger for her ADD and her behavioral issues.  They are happy with her.    Current Medications:  Current Outpatient Prescriptions on File Prior to Visit  Medication Sig Dispense Refill  . amphetamine-dextroamphetamine (ADDERALL XR) 15 MG 24 hr capsule Take 15 mg by mouth every morning.    . Cholecalciferol (VITAMIN D3) 5000 UNITS CAPS Take 5,000 Units by mouth daily.    Marland Kitchen FLUoxetine (PROZAC) 20 MG tablet Take 20 mg by mouth. Takes 2 pills daily = 40 mg daily    . Magnesium 250 MG TABS Take 250 mg by mouth daily.    Marland Kitchen topiramate (TOPAMAX) 100 MG tablet Take 100 mg by mouth 2 (two) times daily.    . mupirocin ointment (BACTROBAN) 2 % Apply 1 application topically 2 (two) times daily. (  Patient not taking: Reported on 04/15/2015) 30 g 3   No current facility-administered medications on file prior to visit.    Health Maintenance:   Immunization History  Administered Date(s) Administered  . Influenza-Unspecified 05/24/2014  . PPD Test 02/19/2014  . Tdap 02/07/2013   Last Dental Exam: 2016 Dr. Lorelle Formosa Last Eye Exam:  Seen last week  Patient Care Team: Lucky Cowboy, MD as PCP - General (Internal Medicine) Tracey Harries, MD as Consulting Physician (Obstetrics and Gynecology) Mat Carne, DO (Optometry)  Allergies:  Allergies  Allergen  Reactions  . Maxitrol [Neomycin-Polymyxin-Dexameth]     Medical History:  Past Medical History  Diagnosis Date  . ADD (attention deficit disorder)   . Mental retardation   . OCD (obsessive compulsive disorder)   . Hyperlipidemia   . Unspecified vitamin D deficiency     Surgical History:  Past Surgical History  Procedure Laterality Date  . Hip surgery    . Scoliosis surgery    . Eye surgery      Family History:  Family History  Problem Relation Age of Onset  . Cancer Mother     basal cell  . Hyperlipidemia Mother   . Heart disease Mother   . Stroke Maternal Grandmother   . Diabetes Maternal Grandmother   . Heart disease Maternal Grandmother     Social History:  Social History  Substance Use Topics  . Smoking status: Never Smoker   . Smokeless tobacco: None  . Alcohol Use: No    Review of Systems: ROS  Physical Exam: Estimated body mass index is 30.15 kg/(m^2) as calculated from the following:   Height as of this encounter: 4' 8.75" (1.441 m).   Weight as of this encounter: 138 lb (62.596 kg). BP 110/78 mmHg  Pulse 76  Temp(Src) 98 F (36.7 C) (Temporal)  Resp 16  Ht 4' 8.75" (1.441 m)  Wt 138 lb (62.596 kg)  BMI 30.15 kg/m2  General Appearance: Well nourished well developed, in no apparent distress.  Eyes: PERRLA, EOMs, conjunctiva no swelling or erythema ENT/Mouth: Ear canals normal without obstruction, swelling, erythema, or discharge.  TMs normal bilaterally with no erythema, bulging, retraction, or loss of landmark.  Oropharynx moist and clear with no exudate, erythema, or swelling.   Neck: Supple, thyroid normal. No bruits.  No cervical adenopathy Respiratory: Respiratory effort normal, Breath sounds clear A&P without wheeze, rhonchi, rales.   Cardio: RRR without murmurs, rubs or gallops. Brisk peripheral pulses without edema.  Chest: symmetric, with normal excursions  Abdomen: Soft, nontender, no guarding, rebound, hernias, masses, or  organomegaly.  Lymphatics: Non tender without lymphadenopathy.  Musculoskeletal: Full ROM all peripheral extremities,5/5 strength, and normal gait.  Skin: Warm, dry without rashes, lesions, ecchymosis. Neuro: Awake and oriented X 3, Cranial nerves intact, reflexes equal bilaterally. Normal muscle tone, no cerebellar symptoms. Sensation intact.  Psych:  normal affect, Insight and Judgment appropriate.   Over 40 minutes of exam, counseling, chart review and critical decision making was performed  Terri Piedra 3:32 PM [facility]

## 2015-04-15 NOTE — Addendum Note (Signed)
Addended by: Shacoria Latif A on: 04/15/2015 04:51 PM   Modules accepted: Orders

## 2015-04-15 NOTE — Patient Instructions (Signed)
Trazodone tablets What is this medicine? TRAZODONE (TRAZ oh done) is used to treat depression. This medicine may be used for other purposes; ask your health care provider or pharmacist if you have questions. COMMON BRAND NAME(S): Desyrel What should I tell my health care provider before I take this medicine? They need to know if you have any of these conditions: -attempted suicide or thinking about it -bipolar disorder -bleeding problems -glaucoma -heart disease, or previous heart attack -irregular heart beat -kidney or liver disease -low levels of sodium in the blood -an unusual or allergic reaction to trazodone, other medicines, foods, dyes or preservatives -pregnant or trying to get pregnant -breast-feeding How should I use this medicine? Take this medicine by mouth with a glass of water. Follow the directions on the prescription label. Take this medicine shortly after a meal or a light snack. Take your medicine at regular intervals. Do not take your medicine more often than directed. Do not stop taking this medicine suddenly except upon the advice of your doctor. Stopping this medicine too quickly may cause serious side effects or your condition may worsen. A special MedGuide will be given to you by the pharmacist with each prescription and refill. Be sure to read this information carefully each time. Talk to your pediatrician regarding the use of this medicine in children. Special care may be needed. Overdosage: If you think you have taken too much of this medicine contact a poison control center or emergency room at once. NOTE: This medicine is only for you. Do not share this medicine with others. What if I miss a dose? If you miss a dose, take it as soon as you can. If it is almost time for your next dose, take only that dose. Do not take double or extra doses. What may interact with this medicine? Do not take this medicine with any of the following medications: -certain medicines  for fungal infections like fluconazole, itraconazole, ketoconazole, posaconazole, voriconazole -cisapride -dofetilide -dronedarone -linezolid -MAOIs like Carbex, Eldepryl, Marplan, Nardil, and Parnate -mesoridazine -methylene blue (injected into a vein) -pimozide -saquinavir -thioridazine -ziprasidone This medicine may also interact with the following medications: -alcohol -antiviral medicines for HIV or AIDS -aspirin and aspirin-like medicines -barbiturates like phenobarbital -certain medicines for blood pressure, heart disease, irregular heart beat -certain medicines for depression, anxiety, or psychotic disturbances -certain medicines for migraine headache like almotriptan, eletriptan, frovatriptan, naratriptan, rizatriptan, sumatriptan, zolmitriptan -certain medicines for seizures like carbamazepine and phenytoin -certain medicines for sleep -certain medicines that treat or prevent blood clots like dalteparin, enoxaparin, warfarin -digoxin -fentanyl -lithium -NSAIDS, medicines for pain and inflammation, like ibuprofen or naproxen -other medicines that prolong the QT interval (cause an abnormal heart rhythm) -rasagiline -supplements like St. John's wort, kava kava, valerian -tramadol -tryptophan This list may not describe all possible interactions. Give your health care provider a list of all the medicines, herbs, non-prescription drugs, or dietary supplements you use. Also tell them if you smoke, drink alcohol, or use illegal drugs. Some items may interact with your medicine. What should I watch for while using this medicine? Tell your doctor if your symptoms do not get better or if they get worse. Visit your doctor or health care professional for regular checks on your progress. Because it may take several weeks to see the full effects of this medicine, it is important to continue your treatment as prescribed by your doctor. Patients and their families should watch out for new  or worsening thoughts of suicide or depression. Also   watch out for sudden changes in feelings such as feeling anxious, agitated, panicky, irritable, hostile, aggressive, impulsive, severely restless, overly excited and hyperactive, or not being able to sleep. If this happens, especially at the beginning of treatment or after a change in dose, call your health care professional. You may get drowsy or dizzy. Do not drive, use machinery, or do anything that needs mental alertness until you know how this medicine affects you. Do not stand or sit up quickly, especially if you are an older patient. This reduces the risk of dizzy or fainting spells. Alcohol may interfere with the effect of this medicine. Avoid alcoholic drinks. This medicine may cause dry eyes and blurred vision. If you wear contact lenses you may feel some discomfort. Lubricating drops may help. See your eye doctor if the problem does not go away or is severe. Your mouth may get dry. Chewing sugarless gum, sucking hard candy and drinking plenty of water may help. Contact your doctor if the problem does not go away or is severe. What side effects may I notice from receiving this medicine? Side effects that you should report to your doctor or health care professional as soon as possible: -allergic reactions like skin rash, itching or hives, swelling of the face, lips, or tongue -fast, irregular heartbeat -feeling faint or lightheaded, falls -painful erections or other sexual dysfunction -suicidal thoughts or other mood changes -trembling Side effects that usually do not require medical attention (report to your doctor or health care professional if they continue or are bothersome): -constipation -headache -muscle aches or pains -nausea, vomiting -unusually weak or tired This list may not describe all possible side effects. Call your doctor for medical advice about side effects. You may report side effects to FDA at 1-800-FDA-1088. Where  should I keep my medicine? Keep out of the reach of children. Store at room temperature between 15 and 30 degrees C (59 to 86 degrees F). Protect from light. Keep container tightly closed. Throw away any unused medicine after the expiration date. NOTE: This sheet is a summary. It may not cover all possible information. If you have questions about this medicine, talk to your doctor, pharmacist, or health care provider.  2015, [employer]vier/[employer] Standard. (2013-02-06 15:46:28)  

## 2015-04-16 LAB — BASIC METABOLIC PANEL WITH GFR
BUN: 15 mg/dL (ref 7–25)
CALCIUM: 9.3 mg/dL (ref 8.6–10.2)
CHLORIDE: 105 mmol/L (ref 98–110)
CO2: 26 mmol/L (ref 20–31)
Creat: 0.57 mg/dL (ref 0.50–1.10)
GFR, Est African American: >89 mL/min (ref >=60)
Glucose, Bld: 75 mg/dL (ref 65–99)
Potassium: 4.5 mmol/L (ref 3.5–5.3)
SODIUM: 137 mmol/L (ref 135–146)

## 2015-04-16 LAB — TSH: TSH: 1.111 u[IU]/mL (ref 0.350–4.500)

## 2015-04-16 LAB — MICROALBUMIN / CREATININE URINE RATIO
Creatinine, Urine: 58.4 mg/dL
MICROALB/CREAT RATIO: 6.8 mg/g (ref 0.0–30.0)
Microalb, Ur: 0.4 mg/dL (ref <2.0)

## 2015-04-16 LAB — HEPATIC FUNCTION PANEL
ALT: 14 U/L (ref 6–29)
AST: 21 U/L (ref 10–30)
Albumin: 4.2 g/dL (ref 3.6–5.1)
Alkaline Phosphatase: 67 U/L (ref 33–115)
BILIRUBIN DIRECT: 0.1 mg/dL (ref <=0.2)
BILIRUBIN INDIRECT: 0.3 mg/dL (ref 0.2–1.2)
Total Bilirubin: 0.4 mg/dL (ref 0.2–1.2)
Total Protein: 6.3 g/dL (ref 6.1–8.1)

## 2015-04-16 LAB — URINALYSIS, ROUTINE W REFLEX MICROSCOPIC
Bilirubin Urine: NEGATIVE
GLUCOSE, UA: NEGATIVE
HGB URINE DIPSTICK: NEGATIVE
KETONES UR: NEGATIVE
LEUKOCYTES UA: NEGATIVE
Nitrite: NEGATIVE
PH: 6.5 (ref 5.0–8.0)
Protein, ur: NEGATIVE
Specific Gravity, Urine: 1.013 (ref 1.001–1.035)

## 2015-04-16 LAB — VITAMIN B12: Vitamin B-12: 475 pg/mL (ref 211–911)

## 2015-04-16 LAB — VITAMIN D 25 HYDROXY (VIT D DEFICIENCY, FRACTURES): Vit D, 25-Hydroxy: 9 ng/mL — ABNORMAL LOW (ref 30–100)

## 2015-04-16 LAB — LIPID PANEL
CHOLESTEROL: 191 mg/dL (ref 125–200)
HDL: 76 mg/dL (ref >=46)
LDL Cholesterol: 102 mg/dL (ref <130)
TRIGLYCERIDES: 63 mg/dL (ref <150)
Total CHOL/HDL Ratio: 2.5 Ratio (ref <=5.0)
VLDL: 13 mg/dL (ref <30)

## 2015-04-16 LAB — MAGNESIUM: Magnesium: 1.9 mg/dL (ref 1.5–2.5)

## 2015-04-16 LAB — IRON AND TIBC
%SAT: 29 % (ref 11–50)
IRON: 92 ug/dL (ref 40–190)
TIBC: 321 ug/dL (ref 250–450)
UIBC: 229 ug/dL (ref 125–400)

## 2015-04-16 LAB — HEMOGLOBIN A1C
Hgb A1c MFr Bld: 5.6 % (ref <5.7)
Mean Plasma Glucose: 114 mg/dL (ref <117)

## 2015-04-16 LAB — INSULIN, RANDOM: INSULIN: 1.7 u[IU]/mL — AB (ref 2.0–19.6)

## 2015-09-02 ENCOUNTER — Encounter: Payer: Self-pay | Admitting: Physician Assistant

## 2015-09-02 ENCOUNTER — Ambulatory Visit (INDEPENDENT_AMBULATORY_CARE_PROVIDER_SITE_OTHER): Payer: BLUE CROSS/BLUE SHIELD | Admitting: Physician Assistant

## 2015-09-02 VITALS — BP 110/64 | HR 69 | Temp 97.3°F | Resp 16 | Ht <= 58 in | Wt 146.6 lb

## 2015-09-02 DIAGNOSIS — J0100 Acute maxillary sinusitis, unspecified: Secondary | ICD-10-CM

## 2015-09-02 MED ORDER — AZITHROMYCIN 250 MG PO TABS: ORAL_TABLET | ORAL | Status: AC

## 2015-09-02 NOTE — Progress Notes (Signed)
   Subjective:    Patient ID: Cathy Howard, female    DOB: 1975-07-23, 40 y.o.   MRN: [id number]  HPI 40 y.o. WF with MR presents with her mother for sinus infection 40 years old. She has had sinus congestion, low grade temperature, frontal headache. Was on amoxicillin and had tooth pulled for abscess tooth on jan 18th, had follow up Tuesday with oral surgeon.   Blood pressure 110/64, pulse 69, temperature 97.3 F (36.3 C), resp. rate 16, height 4' 8.75" (1.441 m), weight 146 lb 9.6 oz (66.497 kg), SpO2 96 %.  Review of Systems  Constitutional: Negative for chills and diaphoresis.  HENT: Positive for congestion, postnasal drip, sinus pressure and sneezing. Negative for ear pain and sore throat.   Respiratory: Positive for cough. Negative for chest tightness, shortness of breath and wheezing.   Cardiovascular: Negative.   Gastrointestinal: Negative.   Genitourinary: Negative.   Musculoskeletal: Negative for neck pain.  Neurological: Positive for headaches.       Objective:   Physical Exam  Constitutional: She appears well-developed and well-nourished.  HENT:  Head: Normocephalic and atraumatic.  Right Ear: External ear normal.  Nose: Right sinus exhibits maxillary sinus tenderness. Right sinus exhibits no frontal sinus tenderness. Left sinus exhibits maxillary sinus tenderness. Left sinus exhibits no frontal sinus tenderness.  Mouth/Throat: No trismus in the jaw. No dental abscesses. Posterior oropharyngeal erythema present. No oropharyngeal exudate, posterior oropharyngeal edema or tonsillar abscesses.  Eyes: Conjunctivae and EOM are normal.  Neck: Normal range of motion. Neck supple.  Cardiovascular: Normal rate, regular rhythm, normal heart sounds and intact distal pulses.   Pulmonary/Chest: Effort normal and breath sounds normal. No respiratory distress. She has no wheezes.  Abdominal: Soft. Bowel sounds are normal.  Lymphadenopathy:    She has cervical adenopathy.  Skin: Skin is  warm and dry.      Assessment & Plan:  1. Acute maxillary sinusitis, recurrence not specified Will hold the zpak and take if she is not getting better, increase fluids, rest, cont allergy pill - azithromycin (ZITHROMAX) 250 MG tablet; Take 2 tablets (500 mg) on  Day 1,  followed by 1 tablet (250 mg) once daily on Days 2 through 5.  Dispense: 6 each; Refill: 1

## 2015-09-02 NOTE — Patient Instructions (Signed)

## 2015-10-17 ENCOUNTER — Ambulatory Visit (INDEPENDENT_AMBULATORY_CARE_PROVIDER_SITE_OTHER): Payer: BLUE CROSS/BLUE SHIELD | Admitting: Internal Medicine

## 2015-10-17 ENCOUNTER — Encounter: Payer: Self-pay | Admitting: Internal Medicine

## 2015-10-17 VITALS — BP 134/82 | HR 80 | Temp 98.0°F | Resp 16 | Ht <= 58 in | Wt 144.0 lb

## 2015-10-17 DIAGNOSIS — I10 Essential (primary) hypertension: Secondary | ICD-10-CM

## 2015-10-17 DIAGNOSIS — J309 Allergic rhinitis, unspecified: Secondary | ICD-10-CM | POA: Diagnosis not present

## 2015-10-17 DIAGNOSIS — E785 Hyperlipidemia, unspecified: Secondary | ICD-10-CM | POA: Diagnosis not present

## 2015-10-17 DIAGNOSIS — Z79899 Other long term (current) drug therapy: Secondary | ICD-10-CM

## 2015-10-17 DIAGNOSIS — R7303 Prediabetes: Secondary | ICD-10-CM | POA: Diagnosis not present

## 2015-10-17 DIAGNOSIS — K5900 Constipation, unspecified: Secondary | ICD-10-CM

## 2015-10-17 LAB — CBC WITH DIFFERENTIAL/PLATELET
BASOS ABS: 0 10*3/uL (ref 0.0–0.1)
BASOS PCT: 0 % (ref 0–1)
EOS ABS: 0.2 10*3/uL (ref 0.0–0.7)
EOS PCT: 5 % (ref 0–5)
HEMATOCRIT: 41.1 % (ref 36.0–46.0)
Hemoglobin: 13.3 g/dL (ref 12.0–15.0)
LYMPHS PCT: 25 % (ref 12–46)
Lymphs Abs: 1.2 10*3/uL (ref 0.7–4.0)
MCH: 30.8 pg (ref 26.0–34.0)
MCHC: 32.4 g/dL (ref 30.0–36.0)
MCV: 95.1 fL (ref 78.0–100.0)
MPV: 9 fL (ref 8.6–12.4)
Monocytes Absolute: 0.5 10*3/uL (ref 0.1–1.0)
Monocytes Relative: 10 % (ref 3–12)
Neutro Abs: 2.9 10*3/uL (ref 1.7–7.7)
Neutrophils Relative %: 60 % (ref 43–77)
PLATELETS: 325 10*3/uL (ref 150–400)
RBC: 4.32 MIL/uL (ref 3.87–5.11)
RDW: 12.5 % (ref 11.5–15.5)
WBC: 4.8 10*3/uL (ref 4.0–10.5)

## 2015-10-17 LAB — LIPID PANEL
CHOL/HDL RATIO: 2.6 ratio (ref <=5.0)
Cholesterol: 193 mg/dL (ref 125–200)
HDL: 73 mg/dL (ref >=46)
LDL CALC: 110 mg/dL (ref <130)
Triglycerides: 49 mg/dL (ref <150)
VLDL: 10 mg/dL (ref <30)

## 2015-10-17 LAB — BASIC METABOLIC PANEL WITH GFR
BUN: 20 mg/dL (ref 7–25)
CO2: 23 mmol/L (ref 20–31)
Calcium: 9.2 mg/dL (ref 8.6–10.2)
Chloride: 107 mmol/L (ref 98–110)
Creat: 0.64 mg/dL (ref 0.50–1.10)
Glucose, Bld: 89 mg/dL (ref 65–99)
POTASSIUM: 4.3 mmol/L (ref 3.5–5.3)
SODIUM: 141 mmol/L (ref 135–146)

## 2015-10-17 LAB — HEPATIC FUNCTION PANEL
ALK PHOS: 71 U/L (ref 33–115)
ALT: 10 U/L (ref 6–29)
AST: 15 U/L (ref 10–30)
Albumin: 4.3 g/dL (ref 3.6–5.1)
BILIRUBIN INDIRECT: 0.3 mg/dL (ref 0.2–1.2)
BILIRUBIN TOTAL: 0.4 mg/dL (ref 0.2–1.2)
Bilirubin, Direct: 0.1 mg/dL (ref <=0.2)
Total Protein: 6.7 g/dL (ref 6.1–8.1)

## 2015-10-17 LAB — TSH: TSH: 1.16 mIU/L

## 2015-10-17 MED ORDER — POLYETHYLENE GLYCOL 3350 17 G PO PACK: 17.0000 g | PACK | Freq: Every day | ORAL | Status: DC

## 2015-10-17 MED ORDER — MELOXICAM 15 MG PO TABS: 15.0000 mg | ORAL_TABLET | Freq: Every day | ORAL | Status: DC

## 2015-10-17 MED ORDER — FLUTICASONE PROPIONATE 50 MCG/ACT NA SUSP: 2.0000 | Freq: Every day | NASAL | Status: DC

## 2015-10-17 NOTE — Progress Notes (Signed)
Assessment and Plan:  Hypertension:  -Continue medication,  -monitor blood pressure at home.  -Continue DASH diet.   -Reminder to go to the ER if any CP, SOB, nausea, dizziness, severe HA, changes vision/speech, left arm numbness and tingling, and jaw pain.  Cholesterol: -Continue diet and exercise.  -Check cholesterol.   Pre-diabetes: -Continue diet and exercise.  -Check A1C  Vitamin D Def: -check level -continue medications.   Allergic Rhinitis -flonase  Bilateral knee pain -mobic  Constipation -miralax  Continue diet and meds as discussed. Further disposition pending results of labs.  HPI 40 y.o. female  presents for 3 month follow up with hypertension, hyperlipidemia, prediabetes and vitamin D.   Her blood pressure has been controlled at home, today their BP is BP: 134/82 mmHg.   She does not workout. She denies chest pain, shortness of breath, dizziness.  She has been walking with her mom and they are doing 4 days a week.   She is not on cholesterol medication and denies myalgias. Her cholesterol is at goal. The cholesterol last visit was:   Lab Results  Component Value Date   CHOL 191 04/15/2015   HDL 76 04/15/2015   LDLCALC 102 04/15/2015   TRIG 63 04/15/2015   CHOLHDL 2.5 04/15/2015     She has been working on diet and exercise for prediabetes, and denies foot ulcerations, hyperglycemia, hypoglycemia , increased appetite, nausea, paresthesia of the feet, polydipsia, polyuria, visual disturbances, vomiting and weight loss. Last A1C in the office was:  Lab Results  Component Value Date   HGBA1C 5.6 04/15/2015    Patient is on Vitamin D supplement.  Lab Results  Component Value Date   VD25OH 9* 04/15/2015     She has been doing well except for the occasional allergy flare ups.  She did have some congestion a couple weeks ago.  She reports that she did cough really hard and she did have some tan brown mucous.  This has been very intermittent.       Current Medications:  Current Outpatient Prescriptions on File Prior to Visit  Medication Sig Dispense Refill  . amphetamine-dextroamphetamine (ADDERALL XR) 15 MG 24 hr capsule Take 15 mg by mouth every morning.    . Cholecalciferol (VITAMIN D3) 5000 UNITS CAPS Take 5,000 Units by mouth daily.    Marland Kitchen. FLUoxetine (PROZAC) 20 MG capsule Take 60 mg by mouth every morning.  5  . Magnesium 250 MG TABS Take 250 mg by mouth daily.    Marland Kitchen. topiramate (TOPAMAX) 100 MG tablet Take 100 mg by mouth 2 (two) times daily.    Marland Kitchen. LORazepam (ATIVAN) 0.5 MG tablet Take 0.5 mg by mouth every 6 (six) hours as needed. Reported on 10/17/2015  5   No current facility-administered medications on file prior to visit.    Medical History:  Past Medical History  Diagnosis Date  . ADD (attention deficit disorder)   . Mental retardation   . OCD (obsessive compulsive disorder)   . Hyperlipidemia   . Unspecified vitamin D deficiency     Allergies:  Allergies  Allergen Reactions  . Maxitrol [Neomycin-Polymyxin-Dexameth]      Review of Systems:  Review of Systems  Constitutional: Negative for fever, chills and malaise/fatigue.  HENT: Negative for congestion, ear pain and sore throat.   Eyes: Negative.   Respiratory: Negative for cough, shortness of breath and wheezing.   Cardiovascular: Negative for chest pain, palpitations and leg swelling.  Gastrointestinal: Positive for constipation. Negative for heartburn, abdominal  pain, diarrhea, blood in stool and melena.  Genitourinary: Negative.   Musculoskeletal: Negative.   Skin: Negative.   Neurological: Negative for dizziness, sensory change, loss of consciousness and headaches.  Psychiatric/Behavioral: Negative for depression. The patient is not nervous/anxious and does not have insomnia.     Family history- Review and unchanged  Social history- Review and unchanged  Physical Exam: BP 134/82 mmHg  Pulse 80  Temp(Src) 98 F (36.7 C) (Temporal)  Resp 16   Ht 4' 8.75" (1.441 m)  Wt 144 lb (65.318 kg)  BMI 31.46 kg/m2 Wt Readings from Last 3 Encounters:  10/17/15 144 lb (65.318 kg)  09/02/15 146 lb 9.6 oz (66.497 kg)  04/15/15 138 lb (62.596 kg)    General Appearance: Well nourished well developed, in no apparent distress. Eyes: PERRLA, EOMs, conjunctiva no swelling or erythema ENT/Mouth: Ear canals normal without obstruction, swelling, erythma, discharge.  TMs normal bilaterally.  Oropharynx moist, clear, without exudate, or postoropharyngeal swelling. Neck: Supple, thyroid normal,no cervical adenopathy  Respiratory: Respiratory effort normal, Breath sounds clear A&P without rhonchi, wheeze, or rale.  No retractions, no accessory usage. Cardio: RRR with no MRGs. Brisk peripheral pulses without edema.  Abdomen: Soft, + BS,  Non tender, no guarding, rebound, hernias, masses. Musculoskeletal: Full ROM, 5/5 strength, Normal gait Skin: Warm, dry without rashes, lesions, ecchymosis.  Neuro: Awake and oriented X 3, Cranial nerves intact. Normal muscle tone, no cerebellar symptoms. Psych: Normal affect, Insight and Judgment appropriate.    Terri Piedra, PA-C 9:04 AM [facility]

## 2015-10-18 LAB — HEMOGLOBIN A1C
Hgb A1c MFr Bld: 5.6 % (ref <5.7)
Mean Plasma Glucose: 114 mg/dL

## 2015-10-29 DIAGNOSIS — F901 Attention-deficit hyperactivity disorder, predominantly hyperactive type: Secondary | ICD-10-CM | POA: Diagnosis not present

## 2015-10-29 DIAGNOSIS — F71 Moderate intellectual disabilities: Secondary | ICD-10-CM | POA: Diagnosis not present

## 2015-10-29 DIAGNOSIS — F422 Mixed obsessional thoughts and acts: Secondary | ICD-10-CM | POA: Diagnosis not present

## 2016-01-15 ENCOUNTER — Ambulatory Visit (INDEPENDENT_AMBULATORY_CARE_PROVIDER_SITE_OTHER): Payer: BLUE CROSS/BLUE SHIELD | Admitting: Internal Medicine

## 2016-01-15 ENCOUNTER — Encounter: Payer: Self-pay | Admitting: Internal Medicine

## 2016-01-15 DIAGNOSIS — E559 Vitamin D deficiency, unspecified: Secondary | ICD-10-CM

## 2016-01-15 DIAGNOSIS — R7303 Prediabetes: Secondary | ICD-10-CM | POA: Diagnosis not present

## 2016-01-15 DIAGNOSIS — Z79899 Other long term (current) drug therapy: Secondary | ICD-10-CM

## 2016-01-15 DIAGNOSIS — E785 Hyperlipidemia, unspecified: Secondary | ICD-10-CM | POA: Diagnosis not present

## 2016-01-15 DIAGNOSIS — F458 Other somatoform disorders: Secondary | ICD-10-CM | POA: Diagnosis not present

## 2016-01-15 DIAGNOSIS — F79 Unspecified intellectual disabilities: Secondary | ICD-10-CM

## 2016-01-15 LAB — CBC WITH DIFFERENTIAL/PLATELET
BASOS ABS: 0 {cells}/uL (ref 0–200)
Basophils Relative: 0 %
EOS PCT: 4 %
Eosinophils Absolute: 364 cells/uL (ref 15–500)
HCT: 43 % (ref 35.0–45.0)
HEMOGLOBIN: 13.9 g/dL (ref 11.7–15.5)
LYMPHS ABS: 1183 {cells}/uL (ref 850–3900)
LYMPHS PCT: 13 %
MCH: 31 pg (ref 27.0–33.0)
MCHC: 32.3 g/dL (ref 32.0–36.0)
MCV: 95.8 fL (ref 80.0–100.0)
MONOS PCT: 7 %
MPV: 9.1 fL (ref 7.5–12.5)
Monocytes Absolute: 637 cells/uL (ref 200–950)
NEUTROS PCT: 76 %
Neutro Abs: 6916 cells/uL (ref 1500–7800)
Platelets: 304 10*3/uL (ref 140–400)
RBC: 4.49 MIL/uL (ref 3.80–5.10)
RDW: 13 % (ref 11.0–15.0)
WBC: 9.1 10*3/uL (ref 3.8–10.8)

## 2016-01-15 LAB — HEPATIC FUNCTION PANEL
ALT: 19 U/L (ref 6–29)
AST: 24 U/L (ref 10–30)
Albumin: 4.2 g/dL (ref 3.6–5.1)
Alkaline Phosphatase: 120 U/L — ABNORMAL HIGH (ref 33–115)
BILIRUBIN DIRECT: 0.1 mg/dL (ref <=0.2)
Indirect Bilirubin: 0.2 mg/dL (ref 0.2–1.2)
TOTAL PROTEIN: 6.7 g/dL (ref 6.1–8.1)
Total Bilirubin: 0.3 mg/dL (ref 0.2–1.2)

## 2016-01-15 LAB — LIPID PANEL
CHOLESTEROL: 211 mg/dL — AB (ref 125–200)
HDL: 80 mg/dL (ref >=46)
LDL Cholesterol: 114 mg/dL (ref <130)
TRIGLYCERIDES: 84 mg/dL (ref <150)
Total CHOL/HDL Ratio: 2.6 Ratio (ref <=5.0)
VLDL: 17 mg/dL (ref <30)

## 2016-01-15 LAB — BASIC METABOLIC PANEL WITH GFR
BUN: 16 mg/dL (ref 7–25)
CALCIUM: 8.8 mg/dL (ref 8.6–10.2)
CO2: 22 mmol/L (ref 20–31)
CREATININE: 0.62 mg/dL (ref 0.50–1.10)
Chloride: 108 mmol/L (ref 98–110)
GFR, Est African American: >89 mL/min (ref >=60)
Glucose, Bld: 106 mg/dL — ABNORMAL HIGH (ref 65–99)
Potassium: 4.9 mmol/L (ref 3.5–5.3)
SODIUM: 140 mmol/L (ref 135–146)

## 2016-01-15 NOTE — Progress Notes (Signed)
Assessment and Plan:  Hypertension:  -Continue medication,  -monitor blood pressure at home.  -Continue DASH diet.   -Reminder to go to the ER if any CP, SOB, nausea, dizziness, severe HA, changes vision/speech, left arm numbness and tingling, and jaw pain.  Cholesterol: -Continue diet and exercise.  -Check cholesterol.   Pre-diabetes: -Continue diet and exercise.  -Check A1C  Vitamin D Def: -check level -continue medications.   Bruxism -followed by dentistry  Obesity -continued exercise -recommended putting padlock on the fridge.    Continue diet and meds as discussed. Further disposition pending results of labs.  HPI 40 y.o. female  presents for 3 month follow up with hypertension, hyperlipidemia, prediabetes and vitamin D.   Her blood pressure has been controlled at home, today their BP is BP: 126/70 mmHg.   She does workout. She denies chest pain, shortness of breath, dizziness.   She is on cholesterol medication and denies myalgias. Her cholesterol is at goal. The cholesterol last visit was:   Lab Results  Component Value Date   CHOL 193 10/17/2015   HDL 73 10/17/2015   LDLCALC 110 10/17/2015   TRIG 49 10/17/2015   CHOLHDL 2.6 10/17/2015     She has been working on diet and exercise for prediabetes, and denies foot ulcerations, hyperglycemia, hypoglycemia , increased appetite, nausea, paresthesia of the feet, polydipsia, polyuria, visual disturbances, vomiting and weight loss. Last A1C in the office was:  Lab Results  Component Value Date   HGBA1C 5.6 10/17/2015    Patient is on Vitamin D supplement.  Lab Results  Component Value Date   VD25OH 9* 04/15/2015     Her mother reports that she is getting up and eating either two mayonnaise sandwiches or a whole box of cookies.  Her mother states that she cannot tolerate sleep medications to prevent her from nighttime eating.  She is in the process of getting several teeth evaluated for extraction due to teeth  grinding.    She is otherwise well.     Current Medications:  Current Outpatient Prescriptions on File Prior to Visit  Medication Sig Dispense Refill  . amphetamine-dextroamphetamine (ADDERALL XR) 15 MG 24 hr capsule Take 15 mg by mouth every morning.    . Cholecalciferol (VITAMIN D3) 5000 UNITS CAPS Take 5,000 Units by mouth daily.    Marland Kitchen. FLUoxetine (PROZAC) 20 MG capsule Take 60 mg by mouth every morning.  5  . fluticasone (FLONASE) 50 MCG/ACT nasal spray Place 2 sprays into both nostrils daily. 16 g 0  . LORazepam (ATIVAN) 0.5 MG tablet Take 0.5 mg by mouth every 6 (six) hours as needed. Reported on 10/17/2015  5  . Magnesium 250 MG TABS Take 250 mg by mouth daily.    . meloxicam (MOBIC) 15 MG tablet Take 1 tablet (15 mg total) by mouth daily. 30 tablet 1  . polyethylene glycol (MIRALAX / GLYCOLAX) packet Take 17 g by mouth daily. 14 each 0  . topiramate (TOPAMAX) 100 MG tablet Take 100 mg by mouth 2 (two) times daily.     No current facility-administered medications on file prior to visit.    Medical History:  Past Medical History  Diagnosis Date  . ADD (attention deficit disorder)   . Mental retardation   . OCD (obsessive compulsive disorder)   . Hyperlipidemia   . Unspecified vitamin D deficiency     Allergies:  Allergies  Allergen Reactions  . Maxitrol [Neomycin-Polymyxin-Dexameth]      Review of Systems:  Review  of Systems  Constitutional: Negative for fever, chills and malaise/fatigue.  HENT: Negative for congestion, ear pain and sore throat.   Respiratory: Negative for cough, shortness of breath and wheezing.   Cardiovascular: Negative for chest pain, palpitations and leg swelling.  Gastrointestinal: Negative for heartburn, abdominal pain, diarrhea, constipation, blood in stool and melena.  Genitourinary: Negative.   Skin: Negative.   Neurological: Negative for dizziness, sensory change, loss of consciousness and headaches.  Psychiatric/Behavioral: Negative for  depression. The patient is not nervous/anxious and does not have insomnia.     Family history- Review and unchanged  Social history- Review and unchanged  Physical Exam: BP 126/70 mmHg  Pulse 78  Temp(Src) 97.8 F (36.6 C) (Temporal)  Resp 16  Ht 4' 8.75" (1.441 m)  Wt 150 lb (68.04 kg)  BMI 32.77 kg/m2 Wt Readings from Last 3 Encounters:  01/15/16 150 lb (68.04 kg)  10/17/15 144 lb (65.318 kg)  09/02/15 146 lb 9.6 oz (66.497 kg)    General Appearance: Well nourished well developed, in no apparent distress. Eyes: PERRLA, EOMs, conjunctiva no swelling or erythema ENT/Mouth: Ear canals normal without obstruction, swelling, erythma, discharge.  TMs normal bilaterally.  Oropharynx moist, clear, without exudate, or postoropharyngeal swelling. Neck: Supple, thyroid normal,no cervical adenopathy  Respiratory: Respiratory effort normal, Breath sounds clear A&P without rhonchi, wheeze, or rale.  No retractions, no accessory usage. Cardio: RRR with no MRGs. Brisk peripheral pulses without edema.  Abdomen: Soft, + BS,  Non tender, no guarding, rebound, hernias, masses. Musculoskeletal: Full ROM, 5/5 strength, Normal gait Skin: Warm, dry without rashes, lesions, ecchymosis.  Neuro: Awake and oriented X 3, Cranial nerves intact. Normal muscle tone, no cerebellar symptoms. Psych: Normal affect, Insight and Judgment appropriate.    Terri Piedraourtney Forcucci, PA-C 10:08 AM [facility]

## 2016-01-16 LAB — HEMOGLOBIN A1C
Hgb A1c MFr Bld: 5.4 % (ref <5.7)
Mean Plasma Glucose: 108 mg/dL

## 2016-02-04 DIAGNOSIS — F422 Mixed obsessional thoughts and acts: Secondary | ICD-10-CM | POA: Diagnosis not present

## 2016-02-04 DIAGNOSIS — F429 Obsessive-compulsive disorder, unspecified: Secondary | ICD-10-CM | POA: Diagnosis not present

## 2016-02-04 DIAGNOSIS — F901 Attention-deficit hyperactivity disorder, predominantly hyperactive type: Secondary | ICD-10-CM | POA: Diagnosis not present

## 2016-02-05 DIAGNOSIS — K0263 Dental caries on smooth surface penetrating into pulp: Secondary | ICD-10-CM | POA: Diagnosis not present

## 2016-04-14 ENCOUNTER — Encounter: Payer: Self-pay | Admitting: Internal Medicine

## 2016-04-14 ENCOUNTER — Ambulatory Visit (INDEPENDENT_AMBULATORY_CARE_PROVIDER_SITE_OTHER): Payer: BLUE CROSS/BLUE SHIELD | Admitting: Internal Medicine

## 2016-04-14 VITALS — BP 128/82 | HR 80 | Temp 97.8°F | Resp 16 | Ht <= 58 in | Wt 155.0 lb

## 2016-04-14 DIAGNOSIS — F988 Other specified behavioral and emotional disorders with onset usually occurring in childhood and adolescence: Secondary | ICD-10-CM

## 2016-04-14 DIAGNOSIS — Z0000 Encounter for general adult medical examination without abnormal findings: Secondary | ICD-10-CM

## 2016-04-14 DIAGNOSIS — Z23 Encounter for immunization: Secondary | ICD-10-CM | POA: Diagnosis not present

## 2016-04-14 DIAGNOSIS — Z131 Encounter for screening for diabetes mellitus: Secondary | ICD-10-CM

## 2016-04-14 DIAGNOSIS — Z1329 Encounter for screening for other suspected endocrine disorder: Secondary | ICD-10-CM

## 2016-04-14 DIAGNOSIS — Z1389 Encounter for screening for other disorder: Secondary | ICD-10-CM

## 2016-04-14 DIAGNOSIS — E785 Hyperlipidemia, unspecified: Secondary | ICD-10-CM

## 2016-04-14 DIAGNOSIS — E559 Vitamin D deficiency, unspecified: Secondary | ICD-10-CM

## 2016-04-14 DIAGNOSIS — Z79899 Other long term (current) drug therapy: Secondary | ICD-10-CM

## 2016-04-14 DIAGNOSIS — Z130 Encounter for screening for diseases of the blood and blood-forming organs and certain disorders involving the immune mechanism: Secondary | ICD-10-CM

## 2016-04-14 DIAGNOSIS — F79 Unspecified intellectual disabilities: Secondary | ICD-10-CM

## 2016-04-14 LAB — LIPID PANEL
Cholesterol: 183 mg/dL (ref 125–200)
HDL: 67 mg/dL (ref >=46)
LDL CALC: 103 mg/dL (ref <130)
TRIGLYCERIDES: 65 mg/dL (ref <150)
Total CHOL/HDL Ratio: 2.7 Ratio (ref <=5.0)
VLDL: 13 mg/dL (ref <30)

## 2016-04-14 LAB — BASIC METABOLIC PANEL WITH GFR
BUN: 13 mg/dL (ref 7–25)
CALCIUM: 9.7 mg/dL (ref 8.6–10.2)
CO2: 19 mmol/L — AB (ref 20–31)
Chloride: 103 mmol/L (ref 98–110)
Creat: 0.62 mg/dL (ref 0.50–1.10)
GFR, Est African American: >89 mL/min (ref >=60)
GLUCOSE: 77 mg/dL (ref 65–99)
Potassium: 4.3 mmol/L (ref 3.5–5.3)
Sodium: 135 mmol/L (ref 135–146)

## 2016-04-14 LAB — CBC WITH DIFFERENTIAL/PLATELET
BASOS ABS: 0 {cells}/uL (ref 0–200)
Basophils Relative: 0 %
EOS ABS: 342 {cells}/uL (ref 15–500)
Eosinophils Relative: 6 %
HEMATOCRIT: 41.5 % (ref 35.0–45.0)
HEMOGLOBIN: 13.6 g/dL (ref 11.7–15.5)
LYMPHS ABS: 1311 {cells}/uL (ref 850–3900)
Lymphocytes Relative: 23 %
MCH: 31.3 pg (ref 27.0–33.0)
MCHC: 32.8 g/dL (ref 32.0–36.0)
MCV: 95.4 fL (ref 80.0–100.0)
MONO ABS: 456 {cells}/uL (ref 200–950)
MPV: 8.8 fL (ref 7.5–12.5)
Monocytes Relative: 8 %
NEUTROS PCT: 63 %
Neutro Abs: 3591 cells/uL (ref 1500–7800)
Platelets: 300 10*3/uL (ref 140–400)
RBC: 4.35 MIL/uL (ref 3.80–5.10)
RDW: 12.7 % (ref 11.0–15.0)
WBC: 5.7 10*3/uL (ref 3.8–10.8)

## 2016-04-14 LAB — IRON AND TIBC
%SAT: 28 % (ref 11–50)
IRON: 99 ug/dL (ref 40–190)
TIBC: 360 ug/dL (ref 250–450)
UIBC: 261 ug/dL (ref 125–400)

## 2016-04-14 LAB — VITAMIN B12: Vitamin B-12: 511 pg/mL (ref 200–1100)

## 2016-04-14 LAB — HEPATIC FUNCTION PANEL
ALBUMIN: 4.4 g/dL (ref 3.6–5.1)
ALK PHOS: 78 U/L (ref 33–115)
ALT: 11 U/L (ref 6–29)
AST: 16 U/L (ref 10–30)
Bilirubin, Direct: 0.1 mg/dL (ref <=0.2)
Indirect Bilirubin: 0.3 mg/dL (ref 0.2–1.2)
TOTAL PROTEIN: 6.7 g/dL (ref 6.1–8.1)
Total Bilirubin: 0.4 mg/dL (ref 0.2–1.2)

## 2016-04-14 LAB — MAGNESIUM: Magnesium: 1.9 mg/dL (ref 1.5–2.5)

## 2016-04-14 LAB — TSH: TSH: 1.06 mIU/L

## 2016-04-14 MED ORDER — MELOXICAM 15 MG PO TABS: 15.0000 mg | ORAL_TABLET | Freq: Every day | ORAL | 1 refills | Status: DC

## 2016-04-14 MED ORDER — RANITIDINE HCL 300 MG PO TABS: 300.0000 mg | ORAL_TABLET | Freq: Every day | ORAL | 1 refills | Status: DC

## 2016-04-14 NOTE — Progress Notes (Signed)
Complete Physical  Assessment and Plan:   1. Routine general medical examination at a health care facility  - CBC with Differential/Platelet - BASIC METABOLIC PANEL WITH GFR - Hepatic function panel - Magnesium  2. Hyperlipidemia -cont meds - Lipid panel  3. ADD (attention deficit disorder) -followed by psych -cont meds  4. Vitamin D deficiency -cont supplement - VITAMIN D 25 Hydroxy (Vit-D Deficiency, Fractures)  5. Mental retardation -followed by psych  6. Morbid obesity due to excess calories (HCC) -recommended to mother that she lock the fridge at nighttime  7. Screening for diabetes mellitus  - Hemoglobin A1c - Insulin, random  8. Screening for deficiency anemia  - Iron and TIBC - Vitamin B12  9. Screening for hematuria or proteinuria  - Urinalysis, Routine w reflex microscopic (not at [facility]) - Microalbumin / creatinine urine ratio  10. Screening for thyroid disorder  - TSH  11. Need for prophylactic vaccination and inoculation against influenza  - Flu Vaccine QUAD with presevative   Discussed med's effects and SE's. Screening labs and tests as requested with regular follow-up as recommended.  HPI  40 y.o. female  presents for a complete physical.  Her blood pressure has been controlled at home, today their BP is BP: 128/82.  She does not workout. She denies chest pain, shortness of breath, dizziness.   She is on cholesterol medication and denies myalgias. Her cholesterol is not at goal. The cholesterol last visit was:  Lab Results  Component Value Date   CHOL 211 (H) 01/15/2016   HDL 80 01/15/2016   LDLCALC 114 01/15/2016   TRIG 84 01/15/2016   CHOLHDL 2.6 01/15/2016  .  Her A1c has been controlled with diet.  Her mother reports that she does eat cake icing and also eating mayonaise sandwiches.  She reports  Last A1C in the office was:  Lab Results  Component Value Date   HGBA1C 5.4 01/15/2016    Patient is on Vitamin D supplement.    Lab Results  Component Value Date   VD25OH 9 (L) 04/15/2015     She has been having some right breast pain at a skin tag.  She reports that she picks at it pretty constantly. It is on her nipple.  There is some whiteness around the skin tag.    Current Medications:  Current Outpatient Prescriptions on File Prior to Visit  Medication Sig Dispense Refill  . amphetamine-dextroamphetamine (ADDERALL XR) 15 MG 24 hr capsule Take 15 mg by mouth every morning.    Marland Kitchen. FLUoxetine (PROZAC) 20 MG capsule Take 60 mg by mouth every morning.  5  . LORazepam (ATIVAN) 0.5 MG tablet Take 0.5 mg by mouth every 6 (six) hours as needed. Reported on 10/17/2015  5  . meloxicam (MOBIC) 15 MG tablet Take 1 tablet (15 mg total) by mouth daily. 30 tablet 1  . topiramate (TOPAMAX) 100 MG tablet Take 100 mg by mouth 2 (two) times daily.     No current facility-administered medications on file prior to visit.     Health Maintenance:   Immunization History  Administered Date(s) Administered  . Influenza Split 04/15/2015  . Influenza,inj,quad, With Preservative 04/14/2016  . Influenza-Unspecified 05/24/2014  . PPD Test 02/19/2014  . Tdap 02/07/2013    Tetanus: 2014 Flu: 2017, today Pap: Not indicated as patient is not sexually active  Patient Care Team: Lucky CowboyWilliam McKeown, MD as PCP - General (Internal Medicine) Tracey Harrieshomas Henley, MD as Consulting Physician (Obstetrics and Gynecology) Mat CarneJean S Parker, DO (  Inactive) (Optometry)  Allergies:  Allergies  Allergen Reactions  . Maxitrol [Neomycin-Polymyxin-Dexameth]     Medical History:  Past Medical History:  Diagnosis Date  . ADD (attention deficit disorder)   . Hyperlipidemia   . Mental retardation   . OCD (obsessive compulsive disorder)   . Unspecified vitamin D deficiency     Surgical History:  Past Surgical History:  Procedure Laterality Date  . EYE SURGERY    . HIP SURGERY    . scoliosis surgery      Family History:  Family History  Problem  Relation Age of Onset  . Cancer Mother     basal cell  . Hyperlipidemia Mother   . Heart disease Mother   . Stroke Maternal Grandmother   . Diabetes Maternal Grandmother   . Heart disease Maternal Grandmother     Social History:  Social History  Substance Use Topics  . Smoking status: Never Smoker  . Smokeless tobacco: Not on file  . Alcohol use No    Review of Systems: Review of Systems  Constitutional: Negative for chills, fever and malaise/fatigue.  HENT: Negative for congestion, ear pain and sore throat.   Eyes: Negative.   Respiratory: Negative for cough, shortness of breath and wheezing.   Cardiovascular: Negative for chest pain, palpitations and leg swelling.  Gastrointestinal: Negative for abdominal pain, blood in stool, constipation, diarrhea, heartburn and melena.  Genitourinary: Negative.   Skin: Negative.   Neurological: Negative for dizziness, sensory change, loss of consciousness and headaches.  Psychiatric/Behavioral: Negative for depression. The patient is not nervous/anxious and does not have insomnia.     Physical Exam: Estimated body mass index is 33.84 kg/m as calculated from the following:   Height as of this encounter: 4' 8.75" (1.441 m).   Weight as of this encounter: 155 lb (70.3 kg). BP 128/82   Pulse 80   Temp 97.8 F (36.6 C) (Temporal)   Resp 16   Ht 4' 8.75" (1.441 m)   Wt 155 lb (70.3 kg)   BMI 33.84 kg/m   General Appearance: Well nourished well developed, in no apparent distress.  Eyes: PERRLA, EOMs, conjunctiva no swelling or erythema, eyes wide set ENT/Mouth: Ear canals normal without obstruction, swelling, erythema, or discharge.  TMs normal bilaterally with no erythema, bulging, retraction, or loss of landmark.  Oropharynx moist and clear with no exudate, erythema, or swelling.   Neck: Supple, thyroid normal. No bruits.  No cervical adenopathy Respiratory: Respiratory effort normal, Breath sounds clear A&P without wheeze, rhonchi,  rales.   Cardio: RRR with 2+ murmur heard best on LSB, no rubs or gallops. Brisk peripheral pulses without edema.  Chest: symmetric, with normal excursions Breasts: Symmetric, without lumps, nipple discharge.  Breasts with bilateral nipple inversion which is baseline for patient  Abdomen: Soft, nontender, no guarding, rebound, hernias, masses, or organomegaly.  Lymphatics: Non tender without lymphadenopathy.  Musculoskeletal: Full ROM all peripheral extremities,5/5 strength, and normal gait. Scoliosis to the right present on examination.  No kyphosis Skin: Warm, dry without rashes, lesions, ecchymosis. Neuro: Awake and oriented X 3, Cranial nerves intact, reflexes equal bilaterally. Normal muscle tone, no cerebellar symptoms. Sensation intact.  Psych:  normal affect, Insight and Judgment appropriate.   Over 40 minutes of exam, counseling, chart review and critical decision making was performed  Cathy Howard 9:23 AM [facility]

## 2016-04-15 ENCOUNTER — Encounter: Payer: Self-pay | Admitting: Internal Medicine

## 2016-04-15 LAB — URINALYSIS, ROUTINE W REFLEX MICROSCOPIC

## 2016-04-15 LAB — HEMOGLOBIN A1C
HEMOGLOBIN A1C: 5.2 % (ref <5.7)
Mean Plasma Glucose: 103 mg/dL

## 2016-04-15 LAB — MICROALBUMIN / CREATININE URINE RATIO

## 2016-04-15 LAB — VITAMIN D 25 HYDROXY (VIT D DEFICIENCY, FRACTURES): VIT D 25 HYDROXY: 17 ng/mL — AB (ref 30–100)

## 2016-04-15 LAB — INSULIN, RANDOM: INSULIN: 2.5 u[IU]/mL (ref 2.0–19.6)

## 2016-04-16 ENCOUNTER — Other Ambulatory Visit: Payer: Self-pay | Admitting: Internal Medicine

## 2016-04-16 MED ORDER — CLOTRIMAZOLE-BETAMETHASONE 1-0.05 % EX CREA: TOPICAL_CREAM | CUTANEOUS | 1 refills | Status: DC

## 2016-04-28 DIAGNOSIS — F71 Moderate intellectual disabilities: Secondary | ICD-10-CM | POA: Diagnosis not present

## 2016-04-28 DIAGNOSIS — F429 Obsessive-compulsive disorder, unspecified: Secondary | ICD-10-CM | POA: Diagnosis not present

## 2016-04-28 DIAGNOSIS — F901 Attention-deficit hyperactivity disorder, predominantly hyperactive type: Secondary | ICD-10-CM | POA: Diagnosis not present

## 2016-05-26 DIAGNOSIS — H5010 Unspecified exotropia: Secondary | ICD-10-CM | POA: Diagnosis not present

## 2016-05-26 DIAGNOSIS — H43392 Other vitreous opacities, left eye: Secondary | ICD-10-CM | POA: Diagnosis not present

## 2016-05-26 DIAGNOSIS — Z961 Presence of intraocular lens: Secondary | ICD-10-CM | POA: Diagnosis not present

## 2016-05-26 DIAGNOSIS — H31012 Macula scars of posterior pole (postinflammatory) (post-traumatic), left eye: Secondary | ICD-10-CM | POA: Diagnosis not present

## 2016-06-29 ENCOUNTER — Other Ambulatory Visit: Payer: Self-pay | Admitting: *Deleted

## 2016-06-29 MED ORDER — MUPIROCIN 2 % EX OINT: 1.0000 "application " | TOPICAL_OINTMENT | Freq: Two times a day (BID) | CUTANEOUS | 0 refills | Status: DC

## 2016-07-28 DIAGNOSIS — F901 Attention-deficit hyperactivity disorder, predominantly hyperactive type: Secondary | ICD-10-CM | POA: Diagnosis not present

## 2016-07-28 DIAGNOSIS — F429 Obsessive-compulsive disorder, unspecified: Secondary | ICD-10-CM | POA: Diagnosis not present

## 2016-07-28 DIAGNOSIS — F71 Moderate intellectual disabilities: Secondary | ICD-10-CM | POA: Diagnosis not present

## 2016-08-20 ENCOUNTER — Ambulatory Visit (HOSPITAL_COMMUNITY)
Admission: RE | Admit: 2016-08-20 | Discharge: 2016-08-20 | Disposition: A | Discharge status: Home / Self Care | Payer: BLUE CROSS/BLUE SHIELD | Source: Ambulatory Visit | Attending: Specialist | Admitting: Specialist

## 2016-08-20 ENCOUNTER — Other Ambulatory Visit (HOSPITAL_COMMUNITY): Payer: Self-pay

## 2016-08-20 DIAGNOSIS — H5712 Ocular pain, left eye: Secondary | ICD-10-CM

## 2016-08-20 DIAGNOSIS — M79652 Pain in left thigh: Secondary | ICD-10-CM | POA: Diagnosis not present

## 2016-08-20 DIAGNOSIS — S0512XA Contusion of eyeball and orbital tissues, left eye, initial encounter: Secondary | ICD-10-CM | POA: Diagnosis not present

## 2016-08-28 DIAGNOSIS — H5712 Ocular pain, left eye: Secondary | ICD-10-CM | POA: Diagnosis not present

## 2016-08-28 DIAGNOSIS — H17822 Peripheral opacity of cornea, left eye: Secondary | ICD-10-CM | POA: Diagnosis not present

## 2016-08-28 DIAGNOSIS — H43392 Other vitreous opacities, left eye: Secondary | ICD-10-CM | POA: Diagnosis not present

## 2016-10-21 DIAGNOSIS — F71 Moderate intellectual disabilities: Secondary | ICD-10-CM | POA: Diagnosis not present

## 2016-10-21 DIAGNOSIS — F429 Obsessive-compulsive disorder, unspecified: Secondary | ICD-10-CM | POA: Diagnosis not present

## 2016-10-21 DIAGNOSIS — F901 Attention-deficit hyperactivity disorder, predominantly hyperactive type: Secondary | ICD-10-CM | POA: Diagnosis not present

## 2016-12-02 ENCOUNTER — Encounter: Payer: Self-pay | Admitting: *Deleted

## 2017-01-21 ENCOUNTER — Encounter: Payer: Self-pay | Admitting: Podiatry

## 2017-01-21 ENCOUNTER — Ambulatory Visit (INDEPENDENT_AMBULATORY_CARE_PROVIDER_SITE_OTHER): Payer: BLUE CROSS/BLUE SHIELD | Admitting: Podiatry

## 2017-01-21 VITALS — BP 131/90

## 2017-01-21 DIAGNOSIS — M79676 Pain in unspecified toe(s): Secondary | ICD-10-CM

## 2017-01-21 DIAGNOSIS — L84 Corns and callosities: Secondary | ICD-10-CM | POA: Diagnosis not present

## 2017-01-21 DIAGNOSIS — M79605 Pain in left leg: Secondary | ICD-10-CM | POA: Diagnosis not present

## 2017-01-21 DIAGNOSIS — M79604 Pain in right leg: Secondary | ICD-10-CM

## 2017-01-21 DIAGNOSIS — B351 Tinea unguium: Secondary | ICD-10-CM | POA: Diagnosis not present

## 2017-01-21 NOTE — Progress Notes (Signed)
   Subjective:    Patient ID: Cathy Howard, female    DOB: 1976-01-17, 41 y.o.   MRN: [id number]  HPI Chief Complaint  Patient presents with  . Callouses    Lt foot callus trim and nail trim     Review of Systems  Skin: Positive for color change.       Objective:   Physical Exam        Assessment & Plan:

## 2017-01-22 NOTE — Progress Notes (Signed)
Subjective:    Patient ID: Cathy Howard, female   DOB: 41 y.o.   MRN: [id number]   HPI patient presents with inability to cut toenails with elongation thickness and pain when palpated. Patient presents with mother is patient is a special needs patient    Review of Systems  All other systems reviewed and are negative.       Objective:  Physical Exam  Cardiovascular: Intact distal pulses.   Musculoskeletal: Normal range of motion.  Neurological: She is alert.  Skin: Skin is warm.  Nursing note and vitals reviewed.  neurovascular status intact muscle strength adequate range of motion within normal limits with patient noted to have a digital deformities bilateral thick yellow brittle nailbeds 1-5 both feet that are painful and keratotic lesion formation left. Patient's found have good digital perfusion well oriented 3     Assessment:     Severe chronic nail disease bilateral with thick yellow brittle type nailbeds and callus formation with structural changes    Plan:    H&P and all conditions reviewed and at this point debridement of nails accomplished with no iatrogenic bleeding. Patient also had lesions debrided and will be seen back as needed

## 2017-02-02 DIAGNOSIS — F429 Obsessive-compulsive disorder, unspecified: Secondary | ICD-10-CM | POA: Diagnosis not present

## 2017-02-02 DIAGNOSIS — F71 Moderate intellectual disabilities: Secondary | ICD-10-CM | POA: Diagnosis not present

## 2017-02-02 DIAGNOSIS — F901 Attention-deficit hyperactivity disorder, predominantly hyperactive type: Secondary | ICD-10-CM | POA: Diagnosis not present

## 2017-02-11 ENCOUNTER — Ambulatory Visit (INDEPENDENT_AMBULATORY_CARE_PROVIDER_SITE_OTHER): Payer: BLUE CROSS/BLUE SHIELD | Admitting: Internal Medicine

## 2017-02-11 ENCOUNTER — Encounter: Payer: Self-pay | Admitting: Internal Medicine

## 2017-02-11 VITALS — BP 112/80 | HR 76 | Temp 97.5°F

## 2017-02-11 DIAGNOSIS — Y92009 Unspecified place in unspecified non-institutional (private) residence as the place of occurrence of the external cause: Secondary | ICD-10-CM

## 2017-02-11 DIAGNOSIS — S0003XA Contusion of scalp, initial encounter: Secondary | ICD-10-CM | POA: Diagnosis not present

## 2017-02-11 DIAGNOSIS — W19XXXA Unspecified fall, initial encounter: Secondary | ICD-10-CM

## 2017-02-11 DIAGNOSIS — S0101XA Laceration without foreign body of scalp, initial encounter: Secondary | ICD-10-CM | POA: Diagnosis not present

## 2017-02-11 NOTE — Progress Notes (Signed)
[facility] [facility]   Lucky CowboyWilliam Lorain Keast, M.D.    Dyanne CarrelAmanda R. Steffanie Dunnollier, P.A.-C      [facility]                [address]                [place], [place] [id number] Telephone [phone] Telefax [phone] Subjective:    Patient ID: Cathy Howard, female    DOB: 13-Jun-1976, 41 y.o.   MRN: [id number]  HPI   This very nice 41 yo cognitively challenged single WF is brought in this am on an emergency basis from a fall after slipping & sustaining a laceration to the Rt frontotemporal scalp area.  No associated LOC or neuro changes reported.   Medication Sig  . amphetamine-dextroamphetamine (ADDERALL XR) 15 MG 24 hr capsule Take 15 mg by mouth every morning.  . clotrimazole-betamethasone (LOTRISONE) cream Apply to affected area 2 times daily  . FLUoxetine (PROZAC) 20 MG capsule Take 60 mg by mouth every morning.  Marland Kitchen. LORazepam (ATIVAN) 0.5 MG tablet Take 0.5 mg by mouth every 6 (six) hours as needed. Reported on 10/17/2015  . meloxicam (MOBIC) 15 MG tablet Take 1 tablet (15 mg total) by mouth daily.  . mupirocin ointment (BACTROBAN) 2 % Place 1 application into the nose 2 (two) times daily.  . ranitidine (ZANTAC) 300 MG tablet Take 1 tablet (300 mg total) by mouth at bedtime.  . topiramate (TOPAMAX) 100 MG tablet Take 100 mg by mouth 2 (two) times daily.   Allergies  Allergen Reactions  . Maxitrol [Neomycin-Polymyxin-Dexameth]    Past Medical History:  Diagnosis Date  . ADD (attention deficit disorder)   . Hyperlipidemia   . Mental retardation   . OCD (obsessive compulsive disorder)   . Unspecified vitamin D deficiency    Review of Systems  10 point systems review negative except as above.    Objective:   Physical Exam  BP 112/80   Pulse 76   Temp (!) 97.5 F (36.4 C)   In no distress.  HEENT - WNL.TM's NL /Neg battle's sn's. Neck - supple.  Chest - Clear equal BS. Cor - Nl HS. RRR w/o sig MGR. PP 1(+). No edema. MS-  FROM w/o deformities.  Gait Nl. Neuro -  Nl w/o focal abnormalities. Skin- There is a 3 .5 cm longitudinal full thickness laceration to the galea in the Rt fronto temporal area.  Procedure (CPT - [id number])   After informed consent from the pr's mother and responsible POA. The wound was prepped w/ alcohol and locally anesthetized with 4 ml of Marcaine 0.5% w/epi and then cleaned with H2O2 quench. The opposing wound edges were approximated & everted with # 3 vertical mattress sutures with Proline 3-0. Then the edges were aligned and closed with a Running suture x 5 sutures with Proline 3-0. Wound was cleaned and "NewSkin" was applied and then dressed with a non-stick guaze and secured with a 4" ave wrap turban style.     Assessment & Plan:   1. Fall in home, initial encounter   2. Contusion of right temporofrontal scalp, initial encounter   3. Scalp laceration, initial encounter  - Repair by procedure CPT: 13121  - ROV 8-9 days.   - Wound care instructions given to mother

## 2017-02-19 ENCOUNTER — Ambulatory Visit: Payer: BLUE CROSS/BLUE SHIELD | Admitting: Internal Medicine

## 2017-02-19 VITALS — BP 120/78 | HR 72 | Temp 97.5°F | Resp 16

## 2017-02-19 DIAGNOSIS — S0101XA Laceration without foreign body of scalp, initial encounter: Secondary | ICD-10-CM

## 2017-02-20 NOTE — Progress Notes (Signed)
   Subjective:    Patient ID: Cathy Howard, female    DOB: 11-14-1975, 41 y.o.   MRN: [id number]  HPI  Patient had a scalp laceration repair on 02/11/2017. Pt's mother / caretaker reports no problems.  Review of Systems Neg.    Objective:   Physical Exam   BP 120/78   Pulse 72   Temp (!) 97.5 F (36.4 C)   Resp 16   HEENT - WNL. Neck - supple.   Lac of scalp appears well healed w/o sign of infection and all sutures removed w/o difficulty    Assessment & Plan:   1. Scalp laceration

## 2017-04-15 ENCOUNTER — Encounter: Payer: Self-pay | Admitting: Internal Medicine

## 2017-04-23 ENCOUNTER — Ambulatory Visit (INDEPENDENT_AMBULATORY_CARE_PROVIDER_SITE_OTHER): Payer: BLUE CROSS/BLUE SHIELD | Admitting: Podiatry

## 2017-04-23 DIAGNOSIS — M79604 Pain in right leg: Secondary | ICD-10-CM | POA: Diagnosis not present

## 2017-04-23 DIAGNOSIS — L84 Corns and callosities: Secondary | ICD-10-CM | POA: Diagnosis not present

## 2017-04-23 DIAGNOSIS — B351 Tinea unguium: Secondary | ICD-10-CM | POA: Diagnosis not present

## 2017-04-23 DIAGNOSIS — M79605 Pain in left leg: Secondary | ICD-10-CM

## 2017-04-23 NOTE — Progress Notes (Signed)
Subjective:    Patient ID: Cathy Howard, female   DOB: 41 y.o.   MRN: [id number]   HPI patient presents with caregiver with painful nail disease 1-5 both feet that they cannot cut and they get thick and also lesions underneath the left foot that are painful    ROS      Objective:  Physical Exam neurovascular status intact with thick yellow brittle nailbeds 1-5 both feet that are painful and keratotic lesion sub-second metatarsal digit 3 bilateral     Assessment:    Chronic mycotic nail infection with lesion formation     Plan:   Debridement of painful nailbeds 1-5 both feet and lesions with no iatrogenic bleeding noted

## 2017-05-05 DIAGNOSIS — F429 Obsessive-compulsive disorder, unspecified: Secondary | ICD-10-CM | POA: Diagnosis not present

## 2017-05-05 DIAGNOSIS — F71 Moderate intellectual disabilities: Secondary | ICD-10-CM | POA: Diagnosis not present

## 2017-05-05 DIAGNOSIS — F901 Attention-deficit hyperactivity disorder, predominantly hyperactive type: Secondary | ICD-10-CM | POA: Diagnosis not present

## 2017-05-20 ENCOUNTER — Encounter: Payer: Self-pay | Admitting: Physician Assistant

## 2017-06-02 DIAGNOSIS — Z961 Presence of intraocular lens: Secondary | ICD-10-CM | POA: Diagnosis not present

## 2017-06-02 DIAGNOSIS — H5010 Unspecified exotropia: Secondary | ICD-10-CM | POA: Diagnosis not present

## 2017-06-02 DIAGNOSIS — H43392 Other vitreous opacities, left eye: Secondary | ICD-10-CM | POA: Diagnosis not present

## 2017-06-02 DIAGNOSIS — H01001 Unspecified blepharitis right upper eyelid: Secondary | ICD-10-CM | POA: Diagnosis not present

## 2017-06-02 DIAGNOSIS — H5211 Myopia, right eye: Secondary | ICD-10-CM | POA: Diagnosis not present

## 2017-06-03 ENCOUNTER — Encounter: Payer: Self-pay | Admitting: Physician Assistant

## 2017-06-03 ENCOUNTER — Ambulatory Visit (INDEPENDENT_AMBULATORY_CARE_PROVIDER_SITE_OTHER): Payer: BLUE CROSS/BLUE SHIELD | Admitting: Physician Assistant

## 2017-06-03 VITALS — BP 118/82 | HR 66 | Temp 97.5°F | Resp 14 | Ht <= 58 in | Wt 147.4 lb

## 2017-06-03 DIAGNOSIS — R609 Edema, unspecified: Secondary | ICD-10-CM

## 2017-06-03 DIAGNOSIS — Z23 Encounter for immunization: Secondary | ICD-10-CM | POA: Diagnosis not present

## 2017-06-03 DIAGNOSIS — Z130 Encounter for screening for diseases of the blood and blood-forming organs and certain disorders involving the immune mechanism: Secondary | ICD-10-CM | POA: Diagnosis not present

## 2017-06-03 DIAGNOSIS — I10 Essential (primary) hypertension: Secondary | ICD-10-CM | POA: Diagnosis not present

## 2017-06-03 DIAGNOSIS — Z79899 Other long term (current) drug therapy: Secondary | ICD-10-CM | POA: Diagnosis not present

## 2017-06-03 DIAGNOSIS — Z6833 Body mass index (BMI) 33.0-33.9, adult: Secondary | ICD-10-CM

## 2017-06-03 DIAGNOSIS — E559 Vitamin D deficiency, unspecified: Secondary | ICD-10-CM

## 2017-06-03 DIAGNOSIS — Z0000 Encounter for general adult medical examination without abnormal findings: Secondary | ICD-10-CM | POA: Diagnosis not present

## 2017-06-03 DIAGNOSIS — Z136 Encounter for screening for cardiovascular disorders: Secondary | ICD-10-CM | POA: Diagnosis not present

## 2017-06-03 DIAGNOSIS — E785 Hyperlipidemia, unspecified: Secondary | ICD-10-CM

## 2017-06-03 DIAGNOSIS — F988 Other specified behavioral and emotional disorders with onset usually occurring in childhood and adolescence: Secondary | ICD-10-CM

## 2017-06-03 DIAGNOSIS — F79 Unspecified intellectual disabilities: Secondary | ICD-10-CM

## 2017-06-03 MED ORDER — MUPIROCIN 2 % EX OINT: 1.0000 "application " | TOPICAL_OINTMENT | Freq: Two times a day (BID) | CUTANEOUS | 0 refills | Status: DC

## 2017-06-03 NOTE — Patient Instructions (Signed)
HOME CARE INSTRUCTIONS   Do not stand or sit in one position for long periods of time. Do not sit with your legs crossed. Rest with your legs raised during the day.  Your legs have to be higher than your heart so that gravity will force the valves to open, so please really elevate your legs.   Wear elastic stockings or support hose. Do not wear other tight, encircling garments around the legs, pelvis, or waist.  [employer]  has a wide variety of well priced compression stockings. [address], [place] [id number] [phone]  [employer] HAS COMPRESSION SOCKS AND CAN GET FUN COLORS  Walk as much as possible to increase blood flow.  Raise the foot of your bed at night with 2-inch blocks. SEEK MEDICAL CARE IF:   The skin around your ankle starts to break down.  You have pain, redness, tenderness, or hard swelling developing in your leg over a vein.  You are uncomfortable due to leg pain. Document Released: 04/15/2005 Document Revised: 09/28/2011 Document Reviewed: 09/01/2010 [facility] Patient Information 2014 [place].   Our lower leg venous system is not the most reliable, the heart does NOT pump fluid up, there is a valve system.  The muscles of the leg squeeze and the blood moves up and a valve opens and close, then they squeeze, blood moves up and valves open and close.  Lots can go wrong with this valve system.  If someone is sitting or standing without movement, everyone will get swelling.

## 2017-06-03 NOTE — Progress Notes (Signed)
Complete Physical  Assessment and Plan:  Mental retardation Continue psych follow up  Morbid obesity (HCC) - follow up 6 months for progress monitoring - increase veggies, decrease carbs - long discussion about weight loss, diet, and exercise  Hyperlipidemia, unspecified hyperlipidemia type -continue medications, check lipids, decrease fatty foods, increase activity.  -     CBC with Differential/Platelet -     BASIC METABOLIC PANEL WITH GFR -     Hepatic function panel -     Lipid panel  Attention deficit disorder, unspecified hyperactivity presence -  Continue ADD medication, helps with focus, no AE's. The patient was counseled on the addictive nature of the medication and was encouraged to take drug holidays when not needed.  Follows psych  Vitamin D deficiency -     VITAMIN D 25 Hydroxy (Vit-D Deficiency, Fractures)  Screening for deficiency anemia -     Iron,Total/Total Iron Binding Cap -     Ferritin -     Vitamin B12  Medication management -     Magnesium  Essential hypertension - continue medications, DASH diet, exercise and monitor at home. Call if greater than 130/80.  -     CBC with Differential/Platelet -     BASIC METABOLIC PANEL WITH GFR -     Hepatic function panel -     TSH -     Urinalysis, Routine w reflex microscopic -     Microalbumin / creatinine urine ratio -     EKG 12-Lead  Routine general medical examination at a health care facility 1 year  BMI 33.0-33.9,adult - follow up 6 months for progress monitoring - increase veggies, decrease carbs - long discussion about weight loss, diet, and exercise  Needs flu shot -     FLU VACCINE MDCK QUAD W/Preservative  Edema, unspecified type -     Brain natriuretic peptide    Discussed med's effects and SE's. Screening labs and tests as requested with regular follow-up as recommended. Future Appointments  Date Time Provider Department Center  07/30/2017  8:15 AM [facility] NURSE [facility]  12/01/2017  8:45 AM Judd Gaudier, NP [facility] None  06/06/2018  9:00 AM Quentin Mulling, PA-C [facility] None    HPI  41 y.o. female  presents for a complete physical.  Her blood pressure has been controlled at home, today their BP is BP: 118/82.  She does not workout. She denies chest pain, shortness of breath, dizziness.  She has MR, mom is here with her, she picks her skin. She follows with psych.  She is on cholesterol medication and denies myalgias. Her cholesterol is not at goal. The cholesterol last visit was:  Lab Results  Component Value Date   CHOL 183 04/14/2016   HDL 67 04/14/2016   LDLCALC 103 04/14/2016   TRIG 65 04/14/2016   CHOLHDL 2.7 04/14/2016  . Her A1c has been controlled with diet.  Her mother reports that she does eat cake icing and also eating mayonaise sandwiches.  She reports  Last A1C in the office was:  Lab Results  Component Value Date   HGBA1C 5.2 04/14/2016   Patient is on Vitamin D supplement.   Lab Results  Component Value Date   VD25OH 17 (L) 04/14/2016     BMI is Body mass index is 33.05 kg/m., she is working on diet and exercise. Wt Readings from Last 3 Encounters:  06/03/17 147 lb 6.4 oz (66.9 kg)  04/14/16 155 lb (70.3 kg)  01/15/16 150 lb (  68 kg)     Current Medications:  Current Outpatient Medications on File Prior to Visit  Medication Sig Dispense Refill  . amphetamine-dextroamphetamine (ADDERALL XR) 15 MG 24 hr capsule Take 15 mg by mouth every morning.    Marland Kitchen. FLUoxetine (PROZAC) 20 MG capsule Take 60 mg by mouth every morning.  5  . LORazepam (ATIVAN) 0.5 MG tablet Take 0.5 mg by mouth every 6 (six) hours as needed. Reported on 10/17/2015  5  . topiramate (TOPAMAX) 100 MG tablet Take 100 mg by mouth 2 (two) times daily.    . ranitidine (ZANTAC) 300 MG tablet Take 1 tablet (300 mg total) by mouth at bedtime. 30 tablet 1   No current facility-administered medications on file prior to visit.     Health  Maintenance:   Immunization History  Administered Date(s) Administered  . Influenza Inj Mdck Quad With Preservative 06/03/2017  . Influenza Split 04/15/2015  . Influenza,inj,quad, With Preservative 04/14/2016  . Influenza-Unspecified 05/24/2014  . PPD Test 02/19/2014  . Tdap 02/07/2013   Tetanus: 2014 Flu: 2018, today Pap: Not indicated as patient is not sexually active Eye doctor Dr. Jimmey RalphParker 06/02/2017  Patient Care Team: Lucky CowboyMcKeown, William, MD as PCP - General (Internal Medicine) Tracey HarriesHenley, Thomas, MD as Consulting Physician (Obstetrics and Gynecology) Mat CarneParker, Jean S, DO (Inactive) (Optometry) Regal, Kirstie PeriNorman S, DPM as Consulting Physician (Podiatry)  Medical History:  Past Medical History:  Diagnosis Date  . ADD (attention deficit disorder)   . Hyperlipidemia   . Mental retardation   . OCD (obsessive compulsive disorder)   . Unspecified vitamin D deficiency    Allergies Allergies  Allergen Reactions  . Maxitrol [Neomycin-Polymyxin-Dexameth]     SURGICAL HISTORY She  has a past surgical history that includes Hip surgery; scoliosis surgery; and Eye surgery. FAMILY HISTORY Her family history includes Cancer in her mother; Diabetes in her maternal grandmother; Heart disease in her maternal grandmother and mother; Hyperlipidemia in her mother; Stroke in her maternal grandmother. SOCIAL HISTORY She  reports that  has never smoked. she has never used smokeless tobacco. She reports that she does not drink alcohol or use drugs.  Review of Systems: Review of Systems  Constitutional: Negative for chills, fever and malaise/fatigue.  HENT: Negative for congestion, ear pain and sore throat.   Eyes: Negative.   Respiratory: Negative for cough, shortness of breath and wheezing.   Cardiovascular: Negative for chest pain, palpitations and leg swelling.  Gastrointestinal: Negative for abdominal pain, blood in stool, constipation, diarrhea, heartburn and melena.  Genitourinary: Negative.    Skin: Negative.   Neurological: Negative for dizziness, sensory change, loss of consciousness and headaches.  Psychiatric/Behavioral: Negative for depression. The patient is not nervous/anxious and does not have insomnia.     Physical Exam: Estimated body mass index is 33.05 kg/m as calculated from the following:   Height as of this encounter: 4\' 8"  (1.422 m).   Weight as of this encounter: 147 lb 6.4 oz (66.9 kg). BP 118/82   Pulse 66   Temp (!) 97.5 F (36.4 C)   Resp 14   Ht 4\' 8"  (1.422 m)   Wt 147 lb 6.4 oz (66.9 kg)   SpO2 95%   BMI 33.05 kg/m   General Appearance: Well nourished well developed, in no apparent distress.  Eyes: PERRLA, EOMs, conjunctiva no swelling or erythema, eyes wide set ENT/Mouth: Ear canals normal without obstruction, swelling, erythema, or discharge.  TMs normal bilaterally with no erythema, bulging, retraction, or loss of landmark.  Oropharynx moist and clear with no exudate, erythema, or swelling.   Neck: Supple, thyroid normal. No bruits.  No cervical adenopathy Respiratory: Respiratory effort normal, Breath sounds clear A&P without wheeze, rhonchi, rales.   Cardio: RRR with 2+ murmur heard best on LSB, no rubs or gallops. Brisk peripheral pulses without edema.  Chest: symmetric, with normal excursions Abdomen: Soft, nontender, no guarding, rebound, hernias, masses, or organomegaly.  Lymphatics: Non tender without lymphadenopathy.  Musculoskeletal: Full ROM all peripheral extremities,5/5 strength, and normal gait. Scoliosis to the right present on examination.  No kyphosis Skin: Warm, dry without rashes, lesions, ecchymosis. Neuro: Awake and oriented X 3, Cranial nerves intact, reflexes equal bilaterally. Normal muscle tone, no cerebellar symptoms. Sensation intact.  Psych:  normal affect, Insight and Judgment appropriate.   Over 40 minutes of exam, counseling, chart review and critical decision making was performed  Quentin MullingAmanda Austina Constantin 12:52  PM [facility]

## 2017-06-04 LAB — TSH: TSH: 0.85 mIU/L

## 2017-06-04 LAB — CBC WITH DIFFERENTIAL/PLATELET
BASOS ABS: 30 {cells}/uL (ref 0–200)
Basophils Relative: 0.5 %
EOS ABS: 150 {cells}/uL (ref 15–500)
Eosinophils Relative: 2.5 %
HCT: 41.3 % (ref 35.0–45.0)
Hemoglobin: 13.4 g/dL (ref 11.7–15.5)
Lymphs Abs: 1344 cells/uL (ref 850–3900)
MCH: 30.3 pg (ref 27.0–33.0)
MCHC: 32.4 g/dL (ref 32.0–36.0)
MCV: 93.4 fL (ref 80.0–100.0)
MONOS PCT: 8.7 %
MPV: 9.7 fL (ref 7.5–12.5)
NEUTROS PCT: 65.9 %
Neutro Abs: 3954 cells/uL (ref 1500–7800)
PLATELETS: 297 10*3/uL (ref 140–400)
RBC: 4.42 10*6/uL (ref 3.80–5.10)
RDW: 11.2 % (ref 11.0–15.0)
TOTAL LYMPHOCYTE: 22.4 %
WBC: 6 10*3/uL (ref 3.8–10.8)
WBCMIX: 522 {cells}/uL (ref 200–950)

## 2017-06-04 LAB — BASIC METABOLIC PANEL WITH GFR
BUN: 20 mg/dL (ref 7–25)
CHLORIDE: 104 mmol/L (ref 98–110)
CO2: 26 mmol/L (ref 20–32)
Calcium: 9.1 mg/dL (ref 8.6–10.2)
Creat: 0.58 mg/dL (ref 0.50–1.10)
GFR, EST AFRICAN AMERICAN: 135 mL/min/{1.73_m2} (ref >=60)
GFR, Est Non African American: 116 mL/min/{1.73_m2} (ref >=60)
Glucose, Bld: 68 mg/dL (ref 65–99)
Potassium: 3.9 mmol/L (ref 3.5–5.3)
SODIUM: 138 mmol/L (ref 135–146)

## 2017-06-04 LAB — HEPATIC FUNCTION PANEL
AG Ratio: 1.9 (calc) (ref 1.0–2.5)
ALT: 9 U/L (ref 6–29)
AST: 13 U/L (ref 10–30)
Albumin: 4.3 g/dL (ref 3.6–5.1)
Alkaline phosphatase (APISO): 62 U/L (ref 33–115)
BILIRUBIN DIRECT: 0.1 mg/dL (ref 0.0–0.2)
BILIRUBIN INDIRECT: 0.4 mg/dL (ref 0.2–1.2)
GLOBULIN: 2.3 g/dL (ref 1.9–3.7)
Total Bilirubin: 0.5 mg/dL (ref 0.2–1.2)
Total Protein: 6.6 g/dL (ref 6.1–8.1)

## 2017-06-04 LAB — IRON, TOTAL/TOTAL IRON BINDING CAP
%SAT: 33 % (calc) (ref 11–50)
Iron: 110 ug/dL (ref 40–190)
TIBC: 330 ug/dL (ref 250–450)

## 2017-06-04 LAB — FERRITIN: Ferritin: 30 ng/mL (ref 10–154)

## 2017-06-04 LAB — LIPID PANEL
CHOL/HDL RATIO: 2.8 (calc) (ref <5.0)
CHOLESTEROL: 185 mg/dL (ref <200)
HDL: 67 mg/dL (ref >50)
LDL CHOLESTEROL (CALC): 104 mg/dL — AB
NON-HDL CHOLESTEROL (CALC): 118 mg/dL (ref <130)
Triglycerides: 62 mg/dL (ref <150)

## 2017-06-04 LAB — VITAMIN B12: Vitamin B-12: 605 pg/mL (ref 200–1100)

## 2017-06-04 LAB — MAGNESIUM: Magnesium: 1.8 mg/dL (ref 1.5–2.5)

## 2017-06-04 LAB — VITAMIN D 25 HYDROXY (VIT D DEFICIENCY, FRACTURES): VIT D 25 HYDROXY: 20 ng/mL — AB (ref 30–100)

## 2017-06-04 LAB — BRAIN NATRIURETIC PEPTIDE: BRAIN NATRIURETIC PEPTIDE: 34 pg/mL (ref <100)

## 2017-06-08 LAB — URINALYSIS, ROUTINE W REFLEX MICROSCOPIC
BILIRUBIN URINE: NEGATIVE
Glucose, UA: NEGATIVE
HGB URINE DIPSTICK: NEGATIVE
KETONES UR: NEGATIVE
Leukocytes, UA: NEGATIVE
Nitrite: NEGATIVE
PH: 6.5 (ref 5.0–8.0)
Protein, ur: NEGATIVE
SPECIFIC GRAVITY, URINE: 1.016 (ref 1.001–1.03)

## 2017-06-08 LAB — MICROALBUMIN / CREATININE URINE RATIO
Creatinine, Urine: 65 mg/dL (ref 20–275)
Microalb Creat Ratio: 12 mcg/mg creat (ref <30)
Microalb, Ur: 0.8 mg/dL

## 2017-07-07 ENCOUNTER — Telehealth: Payer: Self-pay | Admitting: Physician Assistant

## 2017-07-07 MED ORDER — AZITHROMYCIN 250 MG PO TABS: ORAL_TABLET | ORAL | 1 refills | Status: AC

## 2017-07-07 MED ORDER — PREDNISONE 20 MG PO TABS: ORAL_TABLET | ORAL | 0 refills | Status: DC

## 2017-07-07 NOTE — Telephone Encounter (Signed)
Mother was sick, treated recently, daughter Cathy Howard is now similar symptoms. Sinus congestion, low grade temp, cough x yesterday.   Okay to take sudafed, allergy pill Start on prednisone, suggest wait 6 days before take zpak or antibiotic since it has only been 2 days and treat the symptoms.  Info sent via mychart.

## 2017-07-07 NOTE — Telephone Encounter (Signed)
Pt's mother has been informed of MyChart message as well & that were sent to pharmacy on Dec 19th 2018 by dd

## 2017-07-30 ENCOUNTER — Encounter: Payer: Self-pay | Admitting: Podiatry

## 2017-07-30 ENCOUNTER — Ambulatory Visit (INDEPENDENT_AMBULATORY_CARE_PROVIDER_SITE_OTHER): Payer: BLUE CROSS/BLUE SHIELD | Admitting: Podiatry

## 2017-07-30 DIAGNOSIS — M79675 Pain in left toe(s): Secondary | ICD-10-CM | POA: Diagnosis not present

## 2017-07-30 DIAGNOSIS — B351 Tinea unguium: Secondary | ICD-10-CM | POA: Diagnosis not present

## 2017-07-30 DIAGNOSIS — L84 Corns and callosities: Secondary | ICD-10-CM

## 2017-07-30 DIAGNOSIS — M79674 Pain in right toe(s): Secondary | ICD-10-CM | POA: Diagnosis not present

## 2017-07-30 NOTE — Progress Notes (Signed)
Subjective:   Patient ID: Cathy Howard, female   DOB: 42 y.o.   MRN: [id number]   HPI Patient presents with caregiver with severe thickness of nailbeds 1-5 both feet and distal keratotic lesion digit 2 right that's painful. The nails are hard for her to wear shoe gear with due to the pain   ROS      Objective:  Physical Exam  No change neurovascular status patient found to have thickened damage nailbeds 1-5 both feet with keratotic lesion distal second digit right with all areas been painful     Assessment:  Mycotic nail infection 1-5 both feet with distal keratotic lesion digit 2 right     Plan:  Advised patient and caregiver on treatment and today debrided nailbeds 1-5 both feet with no iatrogenic bleeding and debrided lesion with sharp instrumentation sterile second digit right with no iatrogenic bleeding and reappoint to recheck in 3 months or earlier if needed

## 2017-09-15 DIAGNOSIS — F901 Attention-deficit hyperactivity disorder, predominantly hyperactive type: Secondary | ICD-10-CM | POA: Diagnosis not present

## 2017-09-15 DIAGNOSIS — F71 Moderate intellectual disabilities: Secondary | ICD-10-CM | POA: Diagnosis not present

## 2017-09-15 DIAGNOSIS — F429 Obsessive-compulsive disorder, unspecified: Secondary | ICD-10-CM | POA: Diagnosis not present

## 2017-10-29 ENCOUNTER — Ambulatory Visit (INDEPENDENT_AMBULATORY_CARE_PROVIDER_SITE_OTHER): Payer: BLUE CROSS/BLUE SHIELD | Admitting: Podiatry

## 2017-10-29 ENCOUNTER — Encounter: Payer: Self-pay | Admitting: Podiatry

## 2017-10-29 DIAGNOSIS — M79675 Pain in left toe(s): Secondary | ICD-10-CM | POA: Diagnosis not present

## 2017-10-29 DIAGNOSIS — M79674 Pain in right toe(s): Secondary | ICD-10-CM | POA: Diagnosis not present

## 2017-10-29 DIAGNOSIS — M79676 Pain in unspecified toe(s): Secondary | ICD-10-CM

## 2017-10-29 DIAGNOSIS — B351 Tinea unguium: Secondary | ICD-10-CM | POA: Diagnosis not present

## 2017-10-29 DIAGNOSIS — L84 Corns and callosities: Secondary | ICD-10-CM

## 2017-10-31 NOTE — Progress Notes (Signed)
Subjective:   Patient ID: Cathy Howard, female   DOB: 42 y.o.   MRN: [id number]   HPI Patient presents with elongated thickened nailbeds 1-5 both feet that are incurvated and painful and she cannot cut   ROS      Objective:  Physical Exam  Neurovascular status intact with thick yellow brittle nailbeds 1-5 both feet that are painful     Assessment:  Chronic mycotic nail infection with pain 1-5 both feet     Plan:  Debride painful nailbeds 1-5 both feet with no iatrogenic bleeding noted

## 2017-11-09 DIAGNOSIS — F429 Obsessive-compulsive disorder, unspecified: Secondary | ICD-10-CM | POA: Diagnosis not present

## 2017-11-09 DIAGNOSIS — F901 Attention-deficit hyperactivity disorder, predominantly hyperactive type: Secondary | ICD-10-CM | POA: Diagnosis not present

## 2017-11-09 DIAGNOSIS — F71 Moderate intellectual disabilities: Secondary | ICD-10-CM | POA: Diagnosis not present

## 2017-11-29 DIAGNOSIS — E669 Obesity, unspecified: Secondary | ICD-10-CM | POA: Insufficient documentation

## 2017-11-29 DIAGNOSIS — F419 Anxiety disorder, unspecified: Secondary | ICD-10-CM | POA: Insufficient documentation

## 2017-11-29 DIAGNOSIS — E663 Overweight: Secondary | ICD-10-CM | POA: Insufficient documentation

## 2017-11-29 DIAGNOSIS — K219 Gastro-esophageal reflux disease without esophagitis: Secondary | ICD-10-CM | POA: Insufficient documentation

## 2017-11-29 NOTE — Progress Notes (Signed)
FOLLOW UP  Assessment and Plan:   Cholesterol Currently borderline elevations without medication Continue low cholesterol diet and exercise.  Check lipid panel.   Other abnormal glucose Recent A1Cs at goal Discussed diet/exercise, weight management  Defer A1C; check BMP  Obesity with co morbidities Long discussion about weight loss, diet, and exercise Recommended diet heavy in fruits and veggies and low in animal meats, cheeses, and dairy products, appropriate calorie intake Discussed ideal weight for height  Will follow up in 3 months  Vitamin D Def Below goal at last visit; she has started on supplementation since last check Check Vit D level  ADD Continue medications Helps with focus, no AE's. The patient was counseled on the addictive nature of the medication and was encouraged to take drug holidays when not needed.   Anxiety Well managed by current regimen; continue medications Stress management techniques discussed, increase water, good sleep hygiene discussed, increase exercise, and increase veggies.    Continue diet and meds as discussed. Further disposition pending results of labs. Discussed med's effects and SE's.   Over 30 minutes of exam, counseling, chart review, and critical decision making was performed.   Future Appointments  Date Time Provider Department Center  01/28/2018  8:15 AM [facility]URSE [facility]  06/06/2018  9:00 AM Quentin Mulling, PA-C [facility] None    ----------------------------------------------------------------------------------------------------------------------  HPI 42 y.o. female with intellectual disability presents accompanied by her mother for 3 month follow up on cholesterol, obesity, GERD, ADD, anxiety and vitamin D deficiency. She is managed by psych NP Brock Bad.   Patient is on an ADD medication, she states that the medication is helping and she denies any adverse reactions. Takes 15 mg adderall once  daily in the mornings.   she has a diagnosis of anxiety and is currently on prozac 20 mg daily and lorazepam 0.5 mg QID PRN, reports symptoms are well controlled on current regimen. she has reportedly not taken lorazepam in over a year.     BMI is Body mass index is 31.39 kg/m., she has not been working on diet but does exercise - taking walks with mom.  Wt Readings from Last 3 Encounters:  12/01/17 140 lb (63.5 kg)  06/03/17 147 lb 6.4 oz (66.9 kg)  04/14/16 155 lb (70.3 kg)   Today their BP is BP: 124/76  She does workout. She denies chest pain, shortness of breath, dizziness.   She is not on cholesterol medication and denies myalgias. Her cholesterol is not at goal. The cholesterol last visit was:   Lab Results  Component Value Date   CHOL 185 06/03/2017   HDL 67 06/03/2017   LDLCALC 104 (H) 06/03/2017   TRIG 62 06/03/2017   CHOLHDL 2.8 06/03/2017    She has been working on exercise for glucose management, and denies foot ulcerations, increased appetite, nausea, paresthesia of the feet, polydipsia, polyuria, visual disturbances, vomiting and weight loss. Last A1C in the office was:  Lab Results  Component Value Date   HGBA1C 5.2 04/14/2016   Patient is on Vitamin D supplement intermittently:   Lab Results  Component Value Date   VD25OH 20 (L) 06/03/2017        Current Medications:  Current Outpatient Medications on File Prior to Visit  Medication Sig  . amphetamine-dextroamphetamine (ADDERALL XR) 15 MG 24 hr capsule Take 15 mg by mouth every morning.  Marland Kitchen FLUoxetine (PROZAC) 20 MG capsule Take 60 mg by mouth every morning.  . mupirocin ointment (BACTROBAN) 2 % Place  1 application 2 (two) times daily into the nose.  . predniSONE (DELTASONE) 20 MG tablet 2 tablets daily for 3 days, 1 tablet daily for 4 days.  . ranitidine (ZANTAC) 300 MG tablet Take 1 tablet (300 mg total) by mouth at bedtime. (Patient taking differently: Take 300 mg by mouth as needed. )  . topiramate  (TOPAMAX) 100 MG tablet Take 100 mg by mouth 2 (two) times daily.  Marland Kitchen LORazepam (ATIVAN) 0.5 MG tablet Take 0.5 mg by mouth every 6 (six) hours as needed. Reported on 10/17/2015   No current facility-administered medications on file prior to visit.      Allergies:  Allergies  Allergen Reactions  . Maxitrol [Neomycin-Polymyxin-Dexameth]      Medical History:  Past Medical History:  Diagnosis Date  . ADD (attention deficit disorder)   . Hyperlipidemia   . Mental retardation   . OCD (obsessive compulsive disorder)   . Unspecified vitamin D deficiency    Family history- Reviewed and unchanged Social history- Reviewed and unchanged   Review of Systems:  Review of Systems  Constitutional: Negative for malaise/fatigue and weight loss.  HENT: Negative for hearing loss and tinnitus.   Eyes: Negative for blurred vision and double vision.  Respiratory: Negative for cough, shortness of breath and wheezing.   Cardiovascular: Negative for chest pain, palpitations, orthopnea, claudication and leg swelling.  Gastrointestinal: Negative for abdominal pain, blood in stool, constipation, diarrhea, heartburn, melena, nausea and vomiting.  Genitourinary: Negative.   Musculoskeletal: Negative for joint pain and myalgias.  Skin: Negative for rash.  Neurological: Negative for dizziness, tingling, sensory change, weakness and headaches.  Endo/Heme/Allergies: Negative for polydipsia.  Psychiatric/Behavioral: Negative.   All other systems reviewed and are negative.   Physical Exam: BP 124/76   Pulse 81   Temp 98.1 F (36.7 C)   Ht  (1.422 m)   Wt 140 lb (63.5 kg)   SpO2 97%   BMI 31.39 kg/m  Wt Readings from Last 3 Encounters:  12/01/17 140 lb (63.5 kg)  06/03/17 147 lb 6.4 oz (66.9 kg)  04/14/16 155 lb (70.3 kg)   General Appearance: Well nourished, in no apparent distress. Eyes: PERRLA, EOMs, conjunctiva no swelling or erythema Sinuses: No Frontal/maxillary tenderness ENT/Mouth:  Ext aud canals clear, TMs without erythema, bulging. No erythema, swelling, or exudate on post pharynx.  Tonsils not swollen or erythematous. Hearing normal.  Neck: Supple, thyroid normal.  Respiratory: Respiratory effort normal, BS equal bilaterally without rales, rhonchi, wheezing or stridor.  Cardio: RRR with no MRGs. Brisk peripheral pulses without edema.  Abdomen: Soft, + BS.  Non tender, no guarding, rebound, hernias, masses. Lymphatics: Non tender without lymphadenopathy.  Musculoskeletal: Full ROM, 5/5 strength, waddling gait Skin: Warm, dry without rashes, lesions, ecchymosis.  Neuro: Cranial nerves intact. No cerebellar symptoms.  Psych: Awake and oriented X 2, normal affect, Insight and Judgment poor.    Dan Maker, NP 9:05 AM [facility]

## 2017-12-01 ENCOUNTER — Ambulatory Visit: Payer: BLUE CROSS/BLUE SHIELD | Admitting: Adult Health

## 2017-12-01 ENCOUNTER — Encounter: Payer: Self-pay | Admitting: Adult Health

## 2017-12-01 VITALS — BP 124/76 | HR 81 | Temp 98.1°F | Ht <= 58 in | Wt 140.0 lb

## 2017-12-01 DIAGNOSIS — F419 Anxiety disorder, unspecified: Secondary | ICD-10-CM

## 2017-12-01 DIAGNOSIS — E785 Hyperlipidemia, unspecified: Secondary | ICD-10-CM

## 2017-12-01 DIAGNOSIS — F988 Other specified behavioral and emotional disorders with onset usually occurring in childhood and adolescence: Secondary | ICD-10-CM | POA: Diagnosis not present

## 2017-12-01 DIAGNOSIS — E669 Obesity, unspecified: Secondary | ICD-10-CM

## 2017-12-01 DIAGNOSIS — K219 Gastro-esophageal reflux disease without esophagitis: Secondary | ICD-10-CM

## 2017-12-01 DIAGNOSIS — E559 Vitamin D deficiency, unspecified: Secondary | ICD-10-CM

## 2017-12-01 DIAGNOSIS — Z79899 Other long term (current) drug therapy: Secondary | ICD-10-CM

## 2017-12-01 MED ORDER — MUPIROCIN 2 % EX OINT: 1.0000 "application " | TOPICAL_OINTMENT | Freq: Two times a day (BID) | CUTANEOUS | 0 refills | Status: DC

## 2017-12-01 MED ORDER — RANITIDINE HCL 300 MG PO TABS: 300.0000 mg | ORAL_TABLET | ORAL | 0 refills | Status: DC | PRN

## 2017-12-01 NOTE — Patient Instructions (Signed)
Can double up or triple up on vitamin D supplement if forgetting to take frequently    Aim for 7+ servings of fruits and vegetables daily  80+ fluid ounces of water or unsweet tea for healthy kidneys  Limit animal fats in diet for cholesterol and heart health - choose grass fed whenever available  Aim for low stress - take time to unwind and care for your mental health  Aim for 150 min of moderate intensity exercise weekly for heart health, and weights twice weekly for bone health  Aim for 7-9 hours of sleep daily

## 2017-12-02 LAB — CBC WITH DIFFERENTIAL/PLATELET
BASOS PCT: 0.6 %
Basophils Absolute: 28 cells/uL (ref 0–200)
EOS ABS: 267 {cells}/uL (ref 15–500)
EOS PCT: 5.8 %
HCT: 39.6 % (ref 35.0–45.0)
Hemoglobin: 12.9 g/dL (ref 11.7–15.5)
Lymphs Abs: 1035 cells/uL (ref 850–3900)
MCH: 30.3 pg (ref 27.0–33.0)
MCHC: 32.6 g/dL (ref 32.0–36.0)
MCV: 93 fL (ref 80.0–100.0)
MONOS PCT: 9.5 %
MPV: 9.7 fL (ref 7.5–12.5)
NEUTROS ABS: 2834 {cells}/uL (ref 1500–7800)
Neutrophils Relative %: 61.6 %
PLATELETS: 296 10*3/uL (ref 140–400)
RBC: 4.26 10*6/uL (ref 3.80–5.10)
RDW: 11.7 % (ref 11.0–15.0)
TOTAL LYMPHOCYTE: 22.5 %
WBC mixed population: 437 cells/uL (ref 200–950)
WBC: 4.6 10*3/uL (ref 3.8–10.8)

## 2017-12-02 LAB — COMPLETE METABOLIC PANEL WITH GFR
AG Ratio: 1.7 (calc) (ref 1.0–2.5)
ALKALINE PHOSPHATASE (APISO): 74 U/L (ref 33–115)
ALT: 8 U/L (ref 6–29)
AST: 15 U/L (ref 10–30)
Albumin: 4 g/dL (ref 3.6–5.1)
BILIRUBIN TOTAL: 0.4 mg/dL (ref 0.2–1.2)
BUN: 20 mg/dL (ref 7–25)
CHLORIDE: 104 mmol/L (ref 98–110)
CO2: 24 mmol/L (ref 20–32)
CREATININE: 0.64 mg/dL (ref 0.50–1.10)
Calcium: 9.1 mg/dL (ref 8.6–10.2)
GFR, Est African American: 129 mL/min/{1.73_m2} (ref >=60)
GFR, Est Non African American: 112 mL/min/{1.73_m2} (ref >=60)
GLUCOSE: 103 mg/dL — AB (ref 65–99)
Globulin: 2.4 g/dL (calc) (ref 1.9–3.7)
Potassium: 3.9 mmol/L (ref 3.5–5.3)
Sodium: 135 mmol/L (ref 135–146)
Total Protein: 6.4 g/dL (ref 6.1–8.1)

## 2017-12-02 LAB — LIPID PANEL
CHOLESTEROL: 197 mg/dL (ref <200)
HDL: 67 mg/dL (ref >50)
LDL CHOLESTEROL (CALC): 113 mg/dL — AB
Non-HDL Cholesterol (Calc): 130 mg/dL (calc) — ABNORMAL HIGH (ref <130)
TRIGLYCERIDES: 74 mg/dL (ref <150)
Total CHOL/HDL Ratio: 2.9 (calc) (ref <5.0)

## 2017-12-02 LAB — TSH: TSH: 0.98 mIU/L

## 2017-12-02 LAB — VITAMIN D 25 HYDROXY (VIT D DEFICIENCY, FRACTURES): Vit D, 25-Hydroxy: 17 ng/mL — ABNORMAL LOW (ref 30–100)

## 2017-12-30 DIAGNOSIS — F422 Mixed obsessional thoughts and acts: Secondary | ICD-10-CM | POA: Diagnosis not present

## 2018-01-18 DIAGNOSIS — F422 Mixed obsessional thoughts and acts: Secondary | ICD-10-CM | POA: Diagnosis not present

## 2018-01-28 ENCOUNTER — Ambulatory Visit (INDEPENDENT_AMBULATORY_CARE_PROVIDER_SITE_OTHER): Payer: BLUE CROSS/BLUE SHIELD | Admitting: Podiatry

## 2018-01-28 DIAGNOSIS — B351 Tinea unguium: Secondary | ICD-10-CM

## 2018-01-28 DIAGNOSIS — M79676 Pain in unspecified toe(s): Secondary | ICD-10-CM | POA: Diagnosis not present

## 2018-01-28 DIAGNOSIS — M79675 Pain in left toe(s): Principal | ICD-10-CM

## 2018-01-28 DIAGNOSIS — M79674 Pain in right toe(s): Principal | ICD-10-CM

## 2018-01-28 NOTE — Progress Notes (Signed)
Subjective:   Patient ID: Cathy Howard, female   DOB: 42 y.o.   MRN: [id number]   HPI Patient presents with elongated nailbeds 1-5 both feet that are thick yellow brittle and painful and the patient cannot cut   ROS      Objective:  Physical Exam  Thick yellow brittle nailbeds 1-5 both feet that is impossible for patient to cut     Assessment:  Mycotic nail infection with pain 1-5 both feet     Plan:  Debride painful nailbeds 1-5 both feet with no iatrogenic bleeding noted

## 2018-02-23 DIAGNOSIS — F422 Mixed obsessional thoughts and acts: Secondary | ICD-10-CM | POA: Diagnosis not present

## 2018-03-23 DIAGNOSIS — F422 Mixed obsessional thoughts and acts: Secondary | ICD-10-CM | POA: Diagnosis not present

## 2018-04-21 DIAGNOSIS — F422 Mixed obsessional thoughts and acts: Secondary | ICD-10-CM | POA: Diagnosis not present

## 2018-04-29 ENCOUNTER — Encounter: Payer: Self-pay | Admitting: Podiatry

## 2018-04-29 ENCOUNTER — Other Ambulatory Visit: Payer: Self-pay

## 2018-04-29 ENCOUNTER — Ambulatory Visit (INDEPENDENT_AMBULATORY_CARE_PROVIDER_SITE_OTHER): Payer: BLUE CROSS/BLUE SHIELD | Admitting: Podiatry

## 2018-04-29 DIAGNOSIS — M79675 Pain in left toe(s): Secondary | ICD-10-CM | POA: Diagnosis not present

## 2018-04-29 DIAGNOSIS — M79674 Pain in right toe(s): Secondary | ICD-10-CM

## 2018-04-29 DIAGNOSIS — B351 Tinea unguium: Secondary | ICD-10-CM | POA: Diagnosis not present

## 2018-04-29 MED ORDER — MELOXICAM 15 MG PO TABS: 15.0000 mg | ORAL_TABLET | Freq: Every day | ORAL | 1 refills | Status: DC

## 2018-04-29 NOTE — Progress Notes (Signed)
Subjective:   Patient ID: Cathy Howard, female   DOB: 42 y.o.   MRN: [id number]   HPI Patient presents with elongated nailbeds 1-5 both feet that are thick and painful when palpated   ROS      Objective:  Physical Exam  Neurovascular status intact with thick yellow brittle nailbeds 1-5 both feet     Assessment:  Mycotic nail infection with pain 1-5 both feet     Plan:  H&P reviewed conditions debrided painful nailbeds 1-5 both feet with no iatrogenic bleeding and reappoint for routine care

## 2018-06-02 ENCOUNTER — Encounter: Payer: Self-pay | Admitting: Adult Health

## 2018-06-02 ENCOUNTER — Ambulatory Visit: Payer: BLUE CROSS/BLUE SHIELD | Admitting: Adult Health

## 2018-06-02 VITALS — BP 120/76 | HR 70 | Temp 97.7°F | Ht <= 58 in | Wt 135.0 lb

## 2018-06-02 DIAGNOSIS — J060 Acute laryngopharyngitis: Secondary | ICD-10-CM

## 2018-06-02 DIAGNOSIS — R509 Fever, unspecified: Secondary | ICD-10-CM | POA: Diagnosis not present

## 2018-06-02 MED ORDER — PROMETHAZINE-DM 6.25-15 MG/5ML PO SYRP: 5.0000 mL | ORAL_SOLUTION | Freq: Four times a day (QID) | ORAL | 1 refills | Status: DC | PRN

## 2018-06-02 MED ORDER — MELOXICAM 15 MG PO TABS: 15.0000 mg | ORAL_TABLET | ORAL | 1 refills | Status: DC | PRN

## 2018-06-02 MED ORDER — PREDNISONE 20 MG PO TABS: ORAL_TABLET | ORAL | 0 refills | Status: DC

## 2018-06-02 MED ORDER — AZITHROMYCIN 250 MG PO TABS: ORAL_TABLET | ORAL | 1 refills | Status: AC

## 2018-06-02 NOTE — Progress Notes (Signed)
Assessment and Plan:  Cathy Howard was seen today for fever, generalized body aches and headache.  Diagnoses and all orders for this visit:  Acute laryngopharyngitis -     azithromycin (ZITHROMAX) 250 MG tablet; Take 2 tablets (500 mg) on  Day 1,  followed by 1 tablet (250 mg) once daily on Days 2 through 5. -     predniSONE (DELTASONE) 20 MG tablet; 2 tablets daily for 3 days, 1 tablet daily for 4 days. -     promethazine-dextromethorphan (PROMETHAZINE-DM) 6.25-15 MG/5ML syrup; Take 5 mLs by mouth 4 (four) times daily as needed for cough.  Low grade fever Very poor historian, difficult to interview; will check basic labs and UA due to reports of nausea and dysuria The patient's mother was advised to call immediately if the patient has any concerning symptoms in the interval. The mother voices understanding of current treatment options and is in agreement with the current care plan. She knows to call the clinic with any problems, questions or concerns or go to the ER if any further progression of symptoms.  -     CBC with Differential/Platelet -     COMPLETE METABOLIC PANEL WITH GFR -     Urinalysis w microscopic + reflex cultur  Other orders -     meloxicam (MOBIC) 15 MG tablet; Take 1 tablet (15 mg total) by mouth as needed.  Further disposition pending results of labs. Discussed med's effects and SE's.   Over 15 minutes of exam, counseling, chart review, and critical decision making was performed.   Future Appointments  Date Time Provider Department Center  06/06/2018  9:00 AM Quentin Mulling, PA-C [facility] None  07/29/2018  8:15 AM [facility] NURSE [facility]  06/14/2019  9:00 AM Quentin Mulling, PA-C [facility] None    ------------------------------------------------------------------------------------------------------------------   HPI BP 120/76   Pulse 70   Temp 97.7 F (36.5 C)   Ht 4\' 8"  (1.422 m)   Wt 135 lb (61.2 kg)   SpO2 98%   BMI 30.27 kg/m   42 y.o.female  with mental retardation presents accompanied by her mother for evaluation of mild fever (99.8), fatigue/malaise for 4 days; she also endorses bilateral ear pressure, sore throat, hoarseness, non-productive cough, nasal congestion and discharge. She also endorses headache, nausea and dysuria. Her mother did note some brown discharge on her pillow, though she attributes this to the patient eating brownies earlier that even.  She is a poor historian, mother is unsure of some of these symptoms. She has not noted any confusion, agitation, rashes.   Past Medical History:  Diagnosis Date  . ADD (attention deficit disorder)   . Hyperlipidemia   . Mental retardation   . OCD (obsessive compulsive disorder)   . Unspecified vitamin D deficiency      Allergies  Allergen Reactions  . Maxitrol [Neomycin-Polymyxin-Dexameth]     Current Outpatient Medications on File Prior to Visit  Medication Sig  . amphetamine-dextroamphetamine (ADDERALL XR) 15 MG 24 hr capsule Take 15 mg by mouth every morning.  Marland Kitchen FLUoxetine (PROZAC) 20 MG capsule Take 60 mg by mouth every morning.  . topiramate (TOPAMAX) 100 MG tablet Take 100 mg by mouth 2 (two) times daily.  Marland Kitchen LORazepam (ATIVAN) 0.5 MG tablet Take 0.5 mg by mouth every 6 (six) hours as needed. Reported on 10/17/2015  . mupirocin ointment (BACTROBAN) 2 % Place 1 application into the nose 2 (two) times daily. (Patient not taking: Reported on 06/02/2018)  . ranitidine (ZANTAC) 300 MG tablet  Take 1 tablet (300 mg total) by mouth as needed.   No current facility-administered medications on file prior to visit.     ROS: all negative except above.   Physical Exam:  BP 120/76   Pulse 70   Temp 97.7 F (36.5 C)   Ht 4\' 8"  (1.422 m)   Wt 135 lb (61.2 kg)   SpO2 98%   BMI 30.27 kg/m   General Appearance: Well nourished, in no apparent distress, mildly flushed face and extremities Eyes: PERRLA, conjunctiva no swelling or erythema Sinuses: Generalized  maxillary/frontal tenderness ENT/Mouth: Ext aud canals with soft wax bilaterally, TMs without erythema, bulging. No erythema, swelling, or exudate on post pharynx.  Tonsils not swollen or erythematous. Hearing normal.  Neck: Supple, thyroid normal.  Respiratory: Respiratory effort normal, BS equal bilaterally without rales, rhonchi, wheezing or stridor.  Cardio: RRR with no MRGs. Brisk peripheral pulses without edema.  Abdomen: Soft, + BS.  Non tender, no guarding, rebound, hernias, masses. Lymphatics: Non tender without lymphadenopathy.  Musculoskeletal: No obvious effusion, acute abnormality, Symmetrical strength, slow gait.  Skin: Warm, dry without rashes, ecchymosis. She does have scab to right hand with mild surrounding erythema, not hot to touch or discharge.   Neuro: No nuchal rigidity, Normal muscle tone, no cerebellar symptoms.  Psych: Awake and oriented X 3, normal affect, Insight and Judgment appropriate.     Dan MakerAshley C Tamaya Pun, NP 12:35 PM [facility]

## 2018-06-02 NOTE — Patient Instructions (Addendum)
Go to the ER if worsening headache, changes vision/speech, imbalance, weakness, confusion or other severe symptoms  Call me if not getting better, or if there are new symptoms, signs of bleeding, throwing up, blood in stools, etc.     Fever, Adult A fever is an increase in the body's temperature. It is usually defined as a temperature of 100F (38C) or higher. Brief mild or moderate fevers generally have no long-term effects, and they often do not require treatment. Moderate or high fevers may make you feel uncomfortable and can sometimes be a sign of a serious illness or disease. The sweating that may occur with repeated or prolonged fever may also cause dehydration. Fever is confirmed by taking a temperature with a thermometer. A measured temperature can vary with:  Age.  Time of day.  Location of the thermometer: ? Mouth (oral). ? Rectum (rectal). ? Ear (tympanic). ? Underarm (axillary). ? Forehead (temporal).  Follow these instructions at home: Pay attention to any changes in your symptoms. Take these actions to help with your condition:  Take over-the counter and prescription medicines only as told by your health care provider. Follow the dosing instructions carefully.  If you were prescribed an antibiotic medicine, take it as told by your health care provider. Do not stop taking the antibiotic even if you start to feel better.  Rest as needed.  Drink enough fluid to keep your urine clear or pale yellow. This helps to prevent dehydration.  Sponge yourself or bathe with room-temperature water to help reduce your body temperature as needed. Do not use ice water.  Do not overbundle yourself in blankets or heavy clothes.  Contact a health care provider if:  You vomit.  You cannot eat or drink without vomiting.  You have diarrhea.  You have pain when you urinate.  Your symptoms do not improve with treatment.  You develop new symptoms.  You develop excessive  weakness. Get help right away if:  You have shortness of breath or have trouble breathing.  You are dizzy or you faint.  You are disoriented or confused.  You develop signs of dehydration, such as a dry mouth, decreased urination, or paleness.  You develop severe pain in your abdomen.  You have persistent vomiting or diarrhea.  You develop a skin rash.  Your symptoms suddenly get worse. This information is not intended to replace advice given to you by your health care provider. Make sure you discuss any questions you have with your health care provider. Document Released: 12/30/2000 Document Revised: 12/12/2015 Document Reviewed: 08/30/2014 Elsevier Interactive Patient Education  [employer]2018 Elsevier Inc.

## 2018-06-03 LAB — COMPLETE METABOLIC PANEL WITH GFR
AG Ratio: 2 (calc) (ref 1.0–2.5)
ALT: 11 U/L (ref 6–29)
AST: 16 U/L (ref 10–30)
Albumin: 4.6 g/dL (ref 3.6–5.1)
Alkaline phosphatase (APISO): 76 U/L (ref 33–115)
BUN: 18 mg/dL (ref 7–25)
CALCIUM: 9.9 mg/dL (ref 8.6–10.2)
CO2: 28 mmol/L (ref 20–32)
CREATININE: 0.71 mg/dL (ref 0.50–1.10)
Chloride: 104 mmol/L (ref 98–110)
GFR, EST NON AFRICAN AMERICAN: 107 mL/min/{1.73_m2} (ref >=60)
GFR, Est African American: 123 mL/min/{1.73_m2} (ref >=60)
GLUCOSE: 79 mg/dL (ref 65–99)
Globulin: 2.3 g/dL (calc) (ref 1.9–3.7)
POTASSIUM: 4.8 mmol/L (ref 3.5–5.3)
Sodium: 140 mmol/L (ref 135–146)
Total Bilirubin: 0.5 mg/dL (ref 0.2–1.2)
Total Protein: 6.9 g/dL (ref 6.1–8.1)

## 2018-06-03 LAB — URINALYSIS W MICROSCOPIC + REFLEX CULTURE
BILIRUBIN URINE: NEGATIVE
Bacteria, UA: NONE SEEN /HPF
Glucose, UA: NEGATIVE
Hgb urine dipstick: NEGATIVE
Hyaline Cast: NONE SEEN /LPF
KETONES UR: NEGATIVE
LEUKOCYTE ESTERASE: NEGATIVE
NITRITES URINE, INITIAL: NEGATIVE
Protein, ur: NEGATIVE
SPECIFIC GRAVITY, URINE: 1.013 (ref 1.001–1.03)
SQUAMOUS EPITHELIAL / LPF: NONE SEEN /HPF (ref <=5)
WBC UA: NONE SEEN /HPF (ref 0–5)
pH: 7 (ref 5.0–8.0)

## 2018-06-03 LAB — CBC WITH DIFFERENTIAL/PLATELET
BASOS PCT: 0.3 %
Basophils Absolute: 39 cells/uL (ref 0–200)
Eosinophils Absolute: 142 cells/uL (ref 15–500)
Eosinophils Relative: 1.1 %
HEMATOCRIT: 41.2 % (ref 35.0–45.0)
HEMOGLOBIN: 13.8 g/dL (ref 11.7–15.5)
LYMPHS ABS: 980 {cells}/uL (ref 850–3900)
MCH: 31.7 pg (ref 27.0–33.0)
MCHC: 33.5 g/dL (ref 32.0–36.0)
MCV: 94.7 fL (ref 80.0–100.0)
MPV: 9.5 fL (ref 7.5–12.5)
Monocytes Relative: 4.7 %
Neutro Abs: 11133 cells/uL — ABNORMAL HIGH (ref 1500–7800)
Neutrophils Relative %: 86.3 %
Platelets: 311 10*3/uL (ref 140–400)
RBC: 4.35 10*6/uL (ref 3.80–5.10)
RDW: 11.4 % (ref 11.0–15.0)
Total Lymphocyte: 7.6 %
WBC mixed population: 606 cells/uL (ref 200–950)
WBC: 12.9 10*3/uL — AB (ref 3.8–10.8)

## 2018-06-03 LAB — NO CULTURE INDICATED

## 2018-06-03 NOTE — Progress Notes (Signed)
Complete Physical  Assessment and Plan:  Mental retardation Continue follow up  Morbid obesity (HCC) - follow up 6 months for progress monitoring - increase veggies, decrease carbs - long discussion about weight loss, diet, and exercise  Hyperlipidemia, unspecified hyperlipidemia type -continue medications, check lipids, decrease fatty foods, increase activity.  -     CBC with Differential/Platelet -     BASIC METABOLIC PANEL WITH GFR -     Hepatic function panel -     Lipid panel  Attention deficit disorder, unspecified hyperactivity presence -  Continue ADD medication, helps with focus, no AE's. The patient was counseled on the addictive nature of the medication and was encouraged to take drug holidays when not needed.   Vitamin D deficiency -     Just on vitamin D x 2 weeks- will check next OV- encouraged to keep taking and stressed the importance.   Medication management -     Magnesium  Essential hypertension - continue medications, DASH diet, exercise and monitor at home. Call if greater than 130/80.  -     CBC with Differential/Platelet -     BASIC METABOLIC PANEL WITH GFR -     Hepatic function panel -     TSH  Routine general medical examination at a health care facility 1 year  BMI 33.0-33.9,adult - follow up 6 months for progress monitoring - increase veggies, decrease carbs - long discussion about weight loss, diet, and exercise  Needs flu shot -     FLU VACCINE MDCK QUAD W/Preservative    Discussed med's effects and SE's. Screening labs and tests as requested with regular follow-up as recommended. Future Appointments  Date Time Provider Department Center  07/29/2018  8:15 AM [facility] NURSE [facility]  06/14/2019  9:00 AM Quentin Mullingollier, Vergia Chea, PA-C [facility] None    HPI  42 y.o. female  presents for a complete physical.    Her blood pressure has been controlled at home, today their BP is BP: 120/84.  She does not workout. She denies chest  pain, shortness of breath, dizziness.  She has MR, mom is here with her, she picks her skin. She follows with psych but mom was asking if needed in the future could we manage this, psych may be moving.  She is on cholesterol medication and denies myalgias. Her cholesterol is not at goal. The cholesterol last visit was:  Lab Results  Component Value Date   CHOL 197 12/01/2017   HDL 67 12/01/2017   LDLCALC 113 (H) 12/01/2017   TRIG 74 12/01/2017   CHOLHDL 2.9 12/01/2017  . Her A1c has been controlled with diet.  Her mother reports that she does eat cake icing and also eating mayonaise sandwiches.  She reports  Last A1C in the office was:  Lab Results  Component Value Date   HGBA1C 5.2 04/14/2016   Patient is on Vitamin D supplement, started 2 weeks ago 5000 IU a day.   Lab Results  Component Value Date   VD25OH 17 (L) 12/01/2017     BMI is Body mass index is 31.82 kg/m., she is working on diet and exercise. Wt Readings from Last 3 Encounters:  06/06/18 139 lb 6.4 oz (63.2 kg)  06/02/18 135 lb (61.2 kg)  12/01/17 140 lb (63.5 kg)     Current Medications:  Current Outpatient Medications on File Prior to Visit  Medication Sig Dispense Refill  . amphetamine-dextroamphetamine (ADDERALL XR) 15 MG 24 hr capsule Take 15 mg by mouth every  morning.    Marland Kitchen azithromycin (ZITHROMAX) 250 MG tablet Take 2 tablets (500 mg) on  Day 1,  followed by 1 tablet (250 mg) once daily on Days 2 through 5. 6 each 1  . FLUoxetine (PROZAC) 20 MG capsule Take 60 mg by mouth every morning.  5  . LORazepam (ATIVAN) 0.5 MG tablet Take 0.5 mg by mouth every 6 (six) hours as needed. Reported on 10/17/2015  5  . meloxicam (MOBIC) 15 MG tablet Take 1 tablet (15 mg total) by mouth as needed. 30 tablet 1  . mupirocin ointment (BACTROBAN) 2 % Place 1 application into the nose 2 (two) times daily. 22 g 0  . predniSONE (DELTASONE) 20 MG tablet 2 tablets daily for 3 days, 1 tablet daily for 4 days. 10 tablet 0  .  promethazine-dextromethorphan (PROMETHAZINE-DM) 6.25-15 MG/5ML syrup Take 5 mLs by mouth 4 (four) times daily as needed for cough. 240 mL 1  . topiramate (TOPAMAX) 100 MG tablet Take 100 mg by mouth 2 (two) times daily.     No current facility-administered medications on file prior to visit.     Health Maintenance:   Immunization History  Administered Date(s) Administered  . Influenza Inj Mdck Quad With Preservative 06/03/2017  . Influenza Split 04/15/2015  . Influenza,inj,quad, With Preservative 04/14/2016  . Influenza-Unspecified 05/24/2014  . PPD Test 02/19/2014  . Tdap 02/07/2013   Tetanus: 2014 Flu: 2019, today Pap: Not indicated as patient is not sexually active  Patient Care Team: Lucky Cowboy, MD as PCP - General (Internal Medicine) Tracey Harries, MD as Consulting Physician (Obstetrics and Gynecology) Mat Carne, DO (Inactive) (Optometry) Regal, Kirstie Peri, DPM as Consulting Physician (Podiatry)  Medical History:  Past Medical History:  Diagnosis Date  . ADD (attention deficit disorder)   . Hyperlipidemia   . Mental retardation   . OCD (obsessive compulsive disorder)   . Unspecified vitamin D deficiency    Allergies Allergies  Allergen Reactions  . Maxitrol [Neomycin-Polymyxin-Dexameth]     SURGICAL HISTORY She  has a past surgical history that includes Hip surgery; scoliosis surgery; and Eye surgery. FAMILY HISTORY Her family history includes Cancer in her mother; Diabetes in her maternal grandmother; Heart disease in her maternal grandmother and mother; Hyperlipidemia in her mother; Stroke in her maternal grandmother. SOCIAL HISTORY She  reports that she has never smoked. She has never used smokeless tobacco. She reports that she does not drink alcohol or use drugs.  Review of Systems: Review of Systems  Constitutional: Negative for chills, fever and malaise/fatigue.  HENT: Negative for congestion, ear pain and sore throat.   Eyes: Negative.    Respiratory: Negative for cough, shortness of breath and wheezing.   Cardiovascular: Negative for chest pain, palpitations and leg swelling.  Gastrointestinal: Negative for abdominal pain, blood in stool, constipation, diarrhea, heartburn and melena.  Genitourinary: Negative.   Skin: Negative.   Neurological: Negative for dizziness, sensory change, loss of consciousness and headaches.  Psychiatric/Behavioral: Negative for depression. The patient is not nervous/anxious and does not have insomnia.     Physical Exam: Estimated body mass index is 31.82 kg/m as calculated from the following:   Height as of this encounter: 4' 7.5" (1.41 m).   Weight as of this encounter: 139 lb 6.4 oz (63.2 kg). BP 120/84   Pulse 81   Temp 98.2 F (36.8 C)   Resp 18   Ht 4' 7.5" (1.41 m)   Wt 139 lb 6.4 oz (63.2 kg)   SpO2  99%   BMI 31.82 kg/m   General Appearance: Well nourished well developed, in no apparent distress.  Eyes: PERRLA, EOMs, conjunctiva no swelling or erythema, eyes wide set ENT/Mouth: Ear canals normal without obstruction, swelling, erythema, or discharge.  TMs normal bilaterally with no erythema, bulging, retraction, or loss of landmark.  Oropharynx moist and clear with no exudate, erythema, or swelling.   Neck: Supple, thyroid normal. No bruits.  No cervical adenopathy Respiratory: Respiratory effort normal, Breath sounds clear A&P without wheeze, rhonchi, rales.   Cardio: RRR with 2+ murmur heard best on LSB, no rubs or gallops. Brisk peripheral pulses without edema.  Chest: symmetric, with normal excursions Abdomen: Soft, nontender, no guarding, rebound, hernias, masses, or organomegaly.  Lymphatics: Non tender without lymphadenopathy.  Musculoskeletal: Full ROM all peripheral extremities,5/5 strength, and normal gait. Scoliosis to the right present on examination.  No kyphosis Skin: Warm, dry without rashes, lesions, ecchymosis. Neuro: Awake and oriented X 3, Cranial nerves  intact, reflexes equal bilaterally. Normal muscle tone, no cerebellar symptoms. Sensation intact.  Psych:  normal affect, Insight and Judgment appropriate.   Over 40 minutes of exam, counseling, chart review and critical decision making was performed  Quentin Mulling 9:33 AM [facility]

## 2018-06-06 ENCOUNTER — Ambulatory Visit (INDEPENDENT_AMBULATORY_CARE_PROVIDER_SITE_OTHER): Payer: BLUE CROSS/BLUE SHIELD | Admitting: Physician Assistant

## 2018-06-06 ENCOUNTER — Encounter: Payer: Self-pay | Admitting: Physician Assistant

## 2018-06-06 VITALS — BP 120/84 | HR 81 | Temp 98.2°F | Resp 18 | Ht <= 58 in | Wt 139.4 lb

## 2018-06-06 DIAGNOSIS — Z79899 Other long term (current) drug therapy: Secondary | ICD-10-CM | POA: Diagnosis not present

## 2018-06-06 DIAGNOSIS — Z130 Encounter for screening for diseases of the blood and blood-forming organs and certain disorders involving the immune mechanism: Secondary | ICD-10-CM | POA: Diagnosis not present

## 2018-06-06 DIAGNOSIS — E669 Obesity, unspecified: Secondary | ICD-10-CM

## 2018-06-06 DIAGNOSIS — Z23 Encounter for immunization: Secondary | ICD-10-CM | POA: Diagnosis not present

## 2018-06-06 DIAGNOSIS — F79 Unspecified intellectual disabilities: Secondary | ICD-10-CM

## 2018-06-06 DIAGNOSIS — F988 Other specified behavioral and emotional disorders with onset usually occurring in childhood and adolescence: Secondary | ICD-10-CM

## 2018-06-06 DIAGNOSIS — Z131 Encounter for screening for diabetes mellitus: Secondary | ICD-10-CM | POA: Diagnosis not present

## 2018-06-06 DIAGNOSIS — Z0000 Encounter for general adult medical examination without abnormal findings: Secondary | ICD-10-CM

## 2018-06-06 DIAGNOSIS — Z13220 Encounter for screening for lipoid disorders: Secondary | ICD-10-CM

## 2018-06-06 DIAGNOSIS — F419 Anxiety disorder, unspecified: Secondary | ICD-10-CM

## 2018-06-06 DIAGNOSIS — E66811 Obesity, class 1: Secondary | ICD-10-CM

## 2018-06-06 DIAGNOSIS — Z1329 Encounter for screening for other suspected endocrine disorder: Secondary | ICD-10-CM | POA: Diagnosis not present

## 2018-06-06 DIAGNOSIS — E559 Vitamin D deficiency, unspecified: Secondary | ICD-10-CM

## 2018-06-06 DIAGNOSIS — E785 Hyperlipidemia, unspecified: Secondary | ICD-10-CM

## 2018-06-06 DIAGNOSIS — K219 Gastro-esophageal reflux disease without esophagitis: Secondary | ICD-10-CM

## 2018-06-06 NOTE — Patient Instructions (Addendum)
Aleve, ibuprofen is an antiinflammatory You can take tylenol (500mg ) or tylenol arthritis (650mg ) with the meloxicam/antiinflammatories. The max you can take of tylenol a day is 3000mg  daily, this is a max of 6 pills a day of the regular tyelnol (500mg ) or a max of 4 a day of the tylenol arthritis (650mg ) as long as no other medications you are taking contain tylenol.   this can cause inflammation in your stomach and can cause ulcers or bleeding, this will look like black tarry stools Make sure you taking it with food Try not to take it daily, take AS needed Can take with zantac     When it comes to diets, agreement about the perfect plan isn't easy to find, even among the experts. Experts at the [facility] [employer] developed an idea known as the Healthy Eating Plate. Just imagine a plate divided into logical, healthy portions.  The emphasis is on diet quality:  Load up on vegetables and fruits - one-half of your plate: Aim for color and variety, and remember that potatoes don't count.  Go for whole grains - one-quarter of your plate: Whole wheat, barley, wheat berries, quinoa, oats, brown rice, and foods made with them. If you want pasta, go with whole wheat pasta.  Protein power - one-quarter of your plate: Fish, chicken, beans, and nuts are all healthy, versatile protein sources. Limit red meat.  The diet, however, does go beyond the plate, offering a few other suggestions.  Use healthy plant oils, such as olive, canola, soy, corn, sunflower and peanut. Check the labels, and avoid partially hydrogenated oil, which have unhealthy trans fats.  If you're thirsty, drink water. Coffee and tea are good in moderation, but skip sugary drinks and limit milk and dairy products to one or two daily servings.  The type of carbohydrate in the diet is more important than the amount. Some sources of carbohydrates, such as vegetables, fruits, whole grains, and beans-are healthier than  others.  Finally, stay active.   VENOUS INSUFFICIENCY Our lower leg venous system is not the most reliable, the heart does NOT pump fluid up, there is a valve system.  The muscles of the leg squeeze and the blood moves up and a valve opens and close, then they squeeze, blood moves up and valves open and closes keeping the blood moving towards the heart.  Lots can go wrong with this valve system.  If someone is sitting or standing without movement, everyone will get swelling.  THINGS TO DO:  Do not stand or sit in one position for long periods of time. Do not sit with your legs crossed. Rest with your legs raised during the day.  Your legs have to be higher than your heart so that gravity will force the valves to open, so please really elevate your legs.   Wear elastic stockings or support hose. Do not wear other tight, encircling garments around the legs, pelvis, or waist.  [employer]  has a wide variety of well priced compression stockings. [address] [place], [place] [address] [phone]  Or [employer] has a good cheap selection, I like the socks, they are not as hard to get on  Walk as much as possible to increase blood flow.  Raise the foot of your bed at night with 2-inch blocks.  SEEK MEDICAL CARE IF:   The skin around your ankle starts to break down.  You have pain, redness, tenderness, or hard swelling developing in  your leg over a vein.  You are uncomfortable due to leg pain.  If you ever have shortness of breath with exertion or chest pain go to the ER.   .Marland Kitchen

## 2018-06-07 LAB — COMPLETE METABOLIC PANEL WITH GFR
AG RATIO: 1.8 (calc) (ref 1.0–2.5)
ALKALINE PHOSPHATASE (APISO): 68 U/L (ref 33–115)
ALT: 12 U/L (ref 6–29)
AST: 14 U/L (ref 10–30)
Albumin: 4.3 g/dL (ref 3.6–5.1)
BUN: 17 mg/dL (ref 7–25)
CO2: 28 mmol/L (ref 20–32)
Calcium: 9.6 mg/dL (ref 8.6–10.2)
Chloride: 98 mmol/L (ref 98–110)
Creat: 0.64 mg/dL (ref 0.50–1.10)
GFR, Est African American: 129 mL/min/{1.73_m2} (ref >=60)
GFR, Est Non African American: 112 mL/min/{1.73_m2} (ref >=60)
GLOBULIN: 2.4 g/dL (ref 1.9–3.7)
Glucose, Bld: 78 mg/dL (ref 65–99)
Potassium: 4.3 mmol/L (ref 3.5–5.3)
SODIUM: 135 mmol/L (ref 135–146)
Total Bilirubin: 0.5 mg/dL (ref 0.2–1.2)
Total Protein: 6.7 g/dL (ref 6.1–8.1)

## 2018-06-07 LAB — LIPID PANEL
Cholesterol: 200 mg/dL — ABNORMAL HIGH (ref <200)
HDL: 67 mg/dL (ref >50)
LDL CHOLESTEROL (CALC): 110 mg/dL — AB
Non-HDL Cholesterol (Calc): 133 mg/dL (calc) — ABNORMAL HIGH (ref <130)
TRIGLYCERIDES: 121 mg/dL (ref <150)
Total CHOL/HDL Ratio: 3 (calc) (ref <5.0)

## 2018-06-07 LAB — CBC WITH DIFFERENTIAL/PLATELET
BASOS ABS: 32 {cells}/uL (ref 0–200)
Basophils Relative: 0.4 %
EOS PCT: 1.6 %
Eosinophils Absolute: 130 cells/uL (ref 15–500)
HEMATOCRIT: 39.9 % (ref 35.0–45.0)
Hemoglobin: 13.2 g/dL (ref 11.7–15.5)
Lymphs Abs: 2681 cells/uL (ref 850–3900)
MCH: 31.1 pg (ref 27.0–33.0)
MCHC: 33.1 g/dL (ref 32.0–36.0)
MCV: 93.9 fL (ref 80.0–100.0)
MONOS PCT: 9.1 %
MPV: 9.4 fL (ref 7.5–12.5)
NEUTROS PCT: 55.8 %
Neutro Abs: 4520 cells/uL (ref 1500–7800)
Platelets: 315 10*3/uL (ref 140–400)
RBC: 4.25 10*6/uL (ref 3.80–5.10)
RDW: 11.7 % (ref 11.0–15.0)
TOTAL LYMPHOCYTE: 33.1 %
WBC mixed population: 737 cells/uL (ref 200–950)
WBC: 8.1 10*3/uL (ref 3.8–10.8)

## 2018-06-07 LAB — TSH: TSH: 1.25 mIU/L

## 2018-06-07 LAB — IRON, TOTAL/TOTAL IRON BINDING CAP
%SAT: 28 % (calc) (ref 16–45)
IRON: 88 ug/dL (ref 40–190)
TIBC: 318 mcg/dL (calc) (ref 250–450)

## 2018-06-07 LAB — MAGNESIUM: MAGNESIUM: 1.7 mg/dL (ref 1.5–2.5)

## 2018-06-07 LAB — HEMOGLOBIN A1C
HEMOGLOBIN A1C: 5.5 %{Hb} (ref <5.7)
Mean Plasma Glucose: 111 (calc)
eAG (mmol/L): 6.2 (calc)

## 2018-06-07 LAB — VITAMIN B12: VITAMIN B 12: 791 pg/mL (ref 200–1100)

## 2018-06-08 DIAGNOSIS — H52221 Regular astigmatism, right eye: Secondary | ICD-10-CM | POA: Diagnosis not present

## 2018-06-08 DIAGNOSIS — H524 Presbyopia: Secondary | ICD-10-CM | POA: Diagnosis not present

## 2018-06-08 DIAGNOSIS — H5211 Myopia, right eye: Secondary | ICD-10-CM | POA: Diagnosis not present

## 2018-06-10 ENCOUNTER — Encounter: Payer: Self-pay | Admitting: Physician Assistant

## 2018-07-06 DIAGNOSIS — F422 Mixed obsessional thoughts and acts: Secondary | ICD-10-CM | POA: Diagnosis not present

## 2018-07-29 ENCOUNTER — Ambulatory Visit (INDEPENDENT_AMBULATORY_CARE_PROVIDER_SITE_OTHER): Payer: BLUE CROSS/BLUE SHIELD | Admitting: Podiatry

## 2018-07-29 ENCOUNTER — Encounter: Payer: Self-pay | Admitting: Podiatry

## 2018-07-29 DIAGNOSIS — M79675 Pain in left toe(s): Secondary | ICD-10-CM

## 2018-07-29 DIAGNOSIS — M79674 Pain in right toe(s): Secondary | ICD-10-CM | POA: Diagnosis not present

## 2018-07-29 DIAGNOSIS — M79604 Pain in right leg: Secondary | ICD-10-CM

## 2018-07-29 DIAGNOSIS — B351 Tinea unguium: Secondary | ICD-10-CM | POA: Diagnosis not present

## 2018-07-29 DIAGNOSIS — M79605 Pain in left leg: Secondary | ICD-10-CM

## 2018-07-29 NOTE — Progress Notes (Signed)
Subjective:   Patient ID: Cathy Howard, female   DOB: 43 y.o.   MRN: [id number]   HPI Patient presents with caregiver with elongated thickened nailbeds 1-5 both feet that are painful when pressed   ROS      Objective:  Physical Exam  Neurovascular status intact with thick yellow brittle nailbeds 1-5 both feet that are painful     Assessment:  Mycotic nail infection 1-5 both feet with pain     Plan:  Debride painful nailbeds 1-5 both feet with no iatrogenic bleeding noted

## 2018-08-02 DIAGNOSIS — F422 Mixed obsessional thoughts and acts: Secondary | ICD-10-CM | POA: Diagnosis not present

## 2018-08-22 ENCOUNTER — Ambulatory Visit: Payer: BLUE CROSS/BLUE SHIELD | Admitting: Adult Health

## 2018-08-22 ENCOUNTER — Encounter: Payer: Self-pay | Admitting: Adult Health

## 2018-08-22 VITALS — BP 124/80 | HR 86 | Temp 98.1°F | Ht <= 58 in | Wt 137.8 lb

## 2018-08-22 DIAGNOSIS — J209 Acute bronchitis, unspecified: Secondary | ICD-10-CM

## 2018-08-22 MED ORDER — AZITHROMYCIN 250 MG PO TABS: ORAL_TABLET | ORAL | 1 refills | Status: AC

## 2018-08-22 MED ORDER — PREDNISONE 20 MG PO TABS: ORAL_TABLET | ORAL | 0 refills | Status: DC

## 2018-08-22 NOTE — Patient Instructions (Signed)

## 2018-08-22 NOTE — Progress Notes (Signed)
Assessment and Plan:  Cathy Howard was seen today for acute visit.  Diagnoses and all orders for this visit:  Acute bronchitis, unspecified organism Initiate abx due to progressive symptoms, high risk due to mental retardation -  Suggested symptomatic OTC remedies. Nasal saline spray for congestion. Nasal steroids, allergy pill, oral steroids offered Discussed with mother to monitor closely for further progressive respiratory symptoms (dyspnea, increasingly productive cough, fever/chills, confusion) and present to ED for these Call back if not improving or resolving Follow up as needed. -     predniSONE (DELTASONE) 20 MG tablet; 2 tablets daily for 3 days, 1 tablet daily for 4 days. -     azithromycin (ZITHROMAX) 250 MG tablet; Take 2 tablets (500 mg) on  Day 1,  followed by 1 tablet (250 mg) once daily on Days 2 through 5.  Further disposition pending results of labs. Discussed med's effects and SE's.   Over 15 minutes of exam, counseling, chart review, and critical decision making was performed.   Future Appointments  Date Time Provider Department Center  08/22/2018 11:30 AM Judd Gaudier, NP [facility] None  10/27/2018  8:15 AM Lenn Sink, DPM [facility]  12/09/2018 10:00 AM Quentin Mulling, PA-C [facility] None  06/14/2019  9:00 AM Quentin Mulling, PA-C [facility] None    ------------------------------------------------------------------------------------------------------------------   HPI BP 124/80   Pulse 86   Temp 98.1 F (36.7 C)   Ht 4' 7.5" (1.41 m)   Wt 137 lb 12.8 oz (62.5 kg)   SpO2 96%   BMI 31.45 kg/m   43 y.o.female with intellectual disability presents accompanied by her mother for eval URI sx that began 10 days ago; began as cough that has gradually progressed and increasing chest congestion. She did has some nasal drainage. Mild fever yesterday which has resolved. She has been c/o bilateral ear discomfort intermittently. She endorses some soreness of  throat. Cough has been non-productive.   She is on zyrtec PRN and has been taking this for the last 5 days, she has also been taking mucinex (1200 mg).   Frequent junky/bronchitic cough in office; patient is poor historian due to disability.   Past Medical History:  Diagnosis Date  . ADD (attention deficit disorder)   . Hyperlipidemia   . Mental retardation   . OCD (obsessive compulsive disorder)   . Unspecified vitamin D deficiency      Allergies  Allergen Reactions  . Maxitrol [Neomycin-Polymyxin-Dexameth]     Current Outpatient Medications on File Prior to Visit  Medication Sig  . amphetamine-dextroamphetamine (ADDERALL XR) 15 MG 24 hr capsule Take 15 mg by mouth every morning.  Marland Kitchen FLUoxetine (PROZAC) 20 MG capsule Take 60 mg by mouth every morning.  . meloxicam (MOBIC) 15 MG tablet Take 1 tablet (15 mg total) by mouth as needed.  . mupirocin ointment (BACTROBAN) 2 % Place 1 application into the nose 2 (two) times daily. (Patient taking differently: Place 1 application into the nose as needed. )  . promethazine-dextromethorphan (PROMETHAZINE-DM) 6.25-15 MG/5ML syrup Take 5 mLs by mouth 4 (four) times daily as needed for cough.  . topiramate (TOPAMAX) 100 MG tablet Take 100 mg by mouth 2 (two) times daily.  Marland Kitchen LORazepam (ATIVAN) 0.5 MG tablet Take 0.5 mg by mouth every 6 (six) hours as needed. Reported on 10/17/2015   No current facility-administered medications on file prior to visit.     ROS: all negative except above.   Physical Exam:  BP 124/80   Pulse 86   Temp  98.1 F (36.7 C)   Ht 4' 7.5" (1.41 m)   Wt 137 lb 12.8 oz (62.5 kg)   SpO2 96%   BMI 31.45 kg/m   General Appearance: Well nourished, in no apparent distress. Eyes: PERRLA, conjunctiva no swelling or erythema Sinuses: No Frontal/maxillary tenderness ENT/Mouth: Ext aud canals clear except for scant soft wax,TMs without erythema, bulging. No erythema, swelling, or exudate on post pharynx.  Tonsils not  swollen or erythematous. Hearing normal.  Neck: Supple, thyroid normal.  Respiratory: Respiratory effort normal, BS without rales, fine wheezing or stridor. She does have coarse end expiratory wheezing/rhonchi throughout bilaterally.  Cardio: RRR with no MRGs. Brisk peripheral pulses with bilateral puffy ankles consistent with baseline.  Abdomen: Soft, + BS.  Non tender, no guarding. Lymphatics: Non tender without lymphadenopathy.  Musculoskeletal: normal gait.  Skin: Warm, dry without rashes, lesions, ecchymosis.  Neuro: Normal muscle tone  Psych: Awake and oriented x 2 (consistent with baseline), normal affect, Insight and Judgment limited  Dan MakerAshley C Alen Matheson, NP 11:29 AM [facility]

## 2018-09-30 ENCOUNTER — Other Ambulatory Visit: Payer: Self-pay

## 2018-09-30 ENCOUNTER — Ambulatory Visit: Payer: BLUE CROSS/BLUE SHIELD | Admitting: Adult Health Nurse Practitioner

## 2018-09-30 ENCOUNTER — Encounter: Payer: Self-pay | Admitting: Adult Health Nurse Practitioner

## 2018-09-30 ENCOUNTER — Ambulatory Visit: Payer: BLUE CROSS/BLUE SHIELD | Admitting: Physician Assistant

## 2018-09-30 VITALS — BP 122/64 | HR 81 | Ht <= 58 in | Wt 140.6 lb

## 2018-09-30 DIAGNOSIS — Y92009 Unspecified place in unspecified non-institutional (private) residence as the place of occurrence of the external cause: Secondary | ICD-10-CM

## 2018-09-30 DIAGNOSIS — F79 Unspecified intellectual disabilities: Secondary | ICD-10-CM | POA: Diagnosis not present

## 2018-09-30 DIAGNOSIS — S0181XA Laceration without foreign body of other part of head, initial encounter: Secondary | ICD-10-CM

## 2018-09-30 DIAGNOSIS — W19XXXA Unspecified fall, initial encounter: Secondary | ICD-10-CM

## 2018-09-30 DIAGNOSIS — F988 Other specified behavioral and emotional disorders with onset usually occurring in childhood and adolescence: Secondary | ICD-10-CM

## 2018-09-30 NOTE — Patient Instructions (Signed)
Monitor for change in behavior, dizziness, headaches, light sensitivity.  Ice the area for at a time to reduce the swelling.  You can take Tylenol (Acetaminophen)1,000mg  every 8 hours.  To not take more the 4,000mg  of acetaminophen in a 24 hour period.  She may take Ibuprofen 600mg  every 6 hours as needed for pain, take with food.  Do not take with mobic or meloxicam.  Monitor laceration for redness.  Contact office if the area of redness is increasing, increased pain or bleeding or any fevers.     Call or return with new or worsening symptoms as discussed in appointment.  May contact office via phone [phone] or MyChart.        Coronavirus or Covid 19  . You will use the same precautions as you would with the flu virus.  . While we do not have a vaccine against Covid 19 and will not for several years, WE do have a vaccine against the flu. Please get this if you have not yet.  Marland Kitchen Please wash your hands frequently. Reyes Ivan your hands before you eat or touch your face.  . Avoid touching your face, eyes, nose, mouth as much as possible.  . Routinely clean frequently touched items such as doorknobs, keyboards, and phones.  . Avoid crowds of people.   To get more information from reputable sources:  You can call this hotline set up by [facility] 1 877 40COVID [phone])  You can visit these websites: CDC.gov WHO.int  If you feel that you have come in contact with someone that may be infected with coronavirus.  Please stay at home.  Call our office.  Call the local health department.  [facility] Department [phone])  After hours nurse triage line [phone])

## 2018-09-30 NOTE — Progress Notes (Signed)
Assessment and Plan:  Ariann was seen today for acute visit.  Diagnoses and all orders for this visit:  Fall in home, initial encounter Discussed safety with patient and mother Discussed wearing solid foot wear Keeping areas clutter and rug free  Laceration of forehead without complication, initial encounter No bleeding Cleansed area, shallow laceration through epidermis Antibiotic ointment applied and covered with clean dry bandage Change if becomes saturated Cleanse area daily with soap and water Discussed S&S of infection Monitor for behavior or LOC change, vision changes or increased bleeding.  Intellectual disability Has support at home to assist with daily care and monitoring of symptoms  Attention deficit disorder, unspecified hyperactivity presence Doing well at this time Continue medications with benefit   Discussed hospital precautions with patient and mother, agrees with plan of care.  Call or return with new or worsening symptoms as discussed in appointment.  May contact office via phone [phone] or MyChart.   Further disposition pending results of labs. Discussed med's effects and SE's.    Over 15 minutes of exam, counseling, chart review, and critical decision making was performed.   Future Appointments  Date Time Provider Department Center  10/27/2018  8:15 AM Lenn Sink, DPM [facility]  12/09/2018 10:00 AM Quentin Mulling, PA-C [facility] None  06/14/2019  9:00 AM Quentin Mulling, PA-C [facility] None    ------------------------------------------------------------------------------------------------------------------   HPI 43 y.o.female presents for laceration to left forehead. Reports that she was coming out of her bedroom and she tripped on her foot which she then fell and her head hit the corner of a metal folding card table.  She was barefoot at the time.  Reports there was little bit of blood.  They applied a cold wet rag to the  area.  Bleeding stopped.  Reports no LOC.  Since then she has been applying ice to the area.  She has history of MR and is accompanied by her mother today and also lives with her. She participates in an adult day school couple days a week.  Mother reports no change in mentation or behavior and contributed to the ROS as she is a poor historian.   Last Tdap: 2014   Past Medical History:  Diagnosis Date  . ADD (attention deficit disorder)   . Hyperlipidemia   . Mental retardation   . OCD (obsessive compulsive disorder)   . Unspecified vitamin D deficiency      Allergies  Allergen Reactions  . Maxitrol [Neomycin-Polymyxin-Dexameth]     Current Outpatient Medications on File Prior to Visit  Medication Sig  . amphetamine-dextroamphetamine (ADDERALL XR) 15 MG 24 hr capsule Take 15 mg by mouth every morning.  Marland Kitchen FLUoxetine (PROZAC) 20 MG capsule Take 60 mg by mouth every morning.  Marland Kitchen guanFACINE (TENEX) 1 MG tablet Take 1 mg by mouth at bedtime.  Marland Kitchen LORazepam (ATIVAN) 0.5 MG tablet Take 0.5 mg by mouth every 6 (six) hours as needed. Reported on 10/17/2015  . meloxicam (MOBIC) 15 MG tablet Take 1 tablet (15 mg total) by mouth as needed.  . mupirocin ointment (BACTROBAN) 2 % Place 1 application into the nose 2 (two) times daily. (Patient taking differently: Place 1 application into the nose as needed. )  . topiramate (TOPAMAX) 100 MG tablet Take 100 mg by mouth 2 (two) times daily.   No current facility-administered medications on file prior to visit.     ROS: Review of Systems  Constitutional: Negative for chills, diaphoresis, fever, malaise/fatigue and weight loss.  Eyes:  Negative for blurred vision, double vision, photophobia, pain, discharge and redness.  Skin: Negative for itching and rash.  Neurological: Negative for dizziness, tingling, tremors, sensory change, speech change, focal weakness, seizures, loss of consciousness, weakness and headaches.      Physical Exam:  BP 122/64    Pulse 81   Ht 4' 7.5" (1.41 m)   Wt 140 lb 9.6 oz (63.8 kg)   SpO2 98%   BMI 32.09 kg/m   General Appearance: Well nourished, in no apparent distress. Eyes: PERRLA, EOMs, conjunctiva no swelling or erythema Sinuses: No Frontal/maxillary tenderness ENT/Mouth: Ext aud canals clear, TMs without erythema, bulging. No erythema, swelling, or exudate on post pharynx.  Tonsils not swollen or erythematous. Hearing normal.  Neck: Supple, thyroid normal.  Respiratory: Respiratory effort normal, BS equal bilaterally without rales, rhonchi, wheezing or stridor.  Cardio: RRR with no MRGs. Brisk peripheral pulses without edema. a Musculoskeletal: Full ROM, 5/5 strength, antalgic gait which is baseline, scoliosis, right. Skin: Warm, dry without rashes, lesions, ecchymosis.  Neuro: Cranial nerves intact. Normal muscle tone, no cerebellar symptoms. Sensation intact.  Psych: Awake and oriented X 3, normal affect.    Elder Negus, NP 10:26 AM [facility]

## 2018-10-03 ENCOUNTER — Encounter: Payer: Self-pay | Admitting: Adult Health Nurse Practitioner

## 2018-10-06 DIAGNOSIS — F422 Mixed obsessional thoughts and acts: Secondary | ICD-10-CM | POA: Diagnosis not present

## 2018-10-27 ENCOUNTER — Encounter: Payer: Self-pay | Admitting: Podiatry

## 2018-10-27 ENCOUNTER — Other Ambulatory Visit: Payer: Self-pay

## 2018-10-27 ENCOUNTER — Ambulatory Visit (INDEPENDENT_AMBULATORY_CARE_PROVIDER_SITE_OTHER): Payer: BLUE CROSS/BLUE SHIELD | Admitting: Podiatry

## 2018-10-27 VITALS — Temp 97.3°F

## 2018-10-27 DIAGNOSIS — M79674 Pain in right toe(s): Secondary | ICD-10-CM

## 2018-10-27 DIAGNOSIS — R2681 Unsteadiness on feet: Secondary | ICD-10-CM | POA: Diagnosis not present

## 2018-10-27 DIAGNOSIS — M79675 Pain in left toe(s): Secondary | ICD-10-CM

## 2018-10-27 DIAGNOSIS — B351 Tinea unguium: Secondary | ICD-10-CM | POA: Diagnosis not present

## 2018-10-27 DIAGNOSIS — Q6650 Congenital pes planus, unspecified foot: Secondary | ICD-10-CM

## 2018-10-27 DIAGNOSIS — Q6652 Congenital pes planus, left foot: Secondary | ICD-10-CM

## 2018-10-27 DIAGNOSIS — M76821 Posterior tibial tendinitis, right leg: Secondary | ICD-10-CM

## 2018-10-27 NOTE — Progress Notes (Signed)
Pedorthist Note: Upon gait analysis and discussions with caregiver re edema issues right foot, it was decided best possible outcome for R over L ankle instability would be deep seaedt f/o (59mm) with medial skive (3*) and medial post (4*).  Also plan to add medial flange and make the f/o wider than lab standards.  [id number]

## 2018-10-27 NOTE — Progress Notes (Signed)
Subjective:   Patient ID: Cathy Howard, female   DOB: 42 y.o.   MRN: [id number]   HPI Patient presents with caregiver with nail disease and also developing increased instability in both feet with incidences of falling and collapse of medial longitudinal arch of both feet.  Patient caregiver states this is becoming more prevalent   ROS      Objective:  Physical Exam  Neurovascular status intact with collapse of medial longitudinal arch bilateral with thick yellow brittle nailbeds 1-5 both feet that can become painful     Assessment:  Patient who is developing continued collapse of medial longitudinal arch bilateral along with mycotic nail infections 1-5 both feet that are painful when palpated     Plan:  Reviewed condition and at this point debridement accomplished 1-5 both feet with no iatrogenic bleeding and discussed long-term treatment for chronic breakdown of the arch bilateral and patient is referred to ped orthotist who is recommended a device to hold the arch up stable and castings were performed today with patient to receive these when ready

## 2018-11-17 ENCOUNTER — Ambulatory Visit: Payer: BLUE CROSS/BLUE SHIELD | Admitting: Orthotics

## 2018-11-17 ENCOUNTER — Other Ambulatory Visit: Payer: Self-pay

## 2018-11-17 DIAGNOSIS — B351 Tinea unguium: Secondary | ICD-10-CM

## 2018-11-17 DIAGNOSIS — M76821 Posterior tibial tendinitis, right leg: Secondary | ICD-10-CM

## 2018-11-17 DIAGNOSIS — L84 Corns and callosities: Secondary | ICD-10-CM

## 2018-11-17 DIAGNOSIS — R2681 Unsteadiness on feet: Secondary | ICD-10-CM

## 2018-11-17 DIAGNOSIS — M79675 Pain in left toe(s): Principal | ICD-10-CM

## 2018-11-17 DIAGNOSIS — M79674 Pain in right toe(s): Principal | ICD-10-CM

## 2018-11-17 DIAGNOSIS — Q6650 Congenital pes planus, unspecified foot: Secondary | ICD-10-CM

## 2018-11-17 NOTE — Progress Notes (Signed)
Patient came in today to pick up custom made foot orthotics.  The goals were accomplished and the patient reported no dissatisfaction with said orthotics.  Patient was advised of breakin period and how to report any issues. 

## 2018-12-07 NOTE — Progress Notes (Signed)
FOLLOW UP  Assessment and Plan:   Leg pain Will get Xray Decide to send to ortho or PT Normal exam mobic 7.5mg  BId PRN and can do tylenol.  Go to the ER if you have any new weakness in your legs, have trouble controlling your urine or bowels, or have worsening pain.   Cholesterol Currently borderline elevations without medication Continue low cholesterol diet and exercise.  Check lipid panel.   Other abnormal glucose Recent A1Cs at goal Discussed diet/exercise, weight management  Defer A1C; check BMP  Obesity with co morbidities Long discussion about weight loss, diet, and exercise Recommended diet heavy in fruits and veggies and low in animal meats, cheeses, and dairy products, appropriate calorie intake Discussed ideal weight for height  Will follow up in 3 months  Vitamin D Def Below goal at last visit; she has started on supplementation since last check Check Vit D level  ADD Continue medications Helps with focus, no AE's. The patient was counseled on the addictive nature of the medication and was encouraged to take drug holidays when not needed.   Anxiety Well managed by current regimen; continue medications Stress management techniques discussed, increase water, good sleep hygiene discussed, increase exercise, and increase veggies.    Continue diet and meds as discussed. Further disposition pending results of labs. Discussed med's effects and SE's.   Over 30 minutes of exam, counseling, chart review, and critical decision making was performed.   Future Appointments  Date Time Provider Department Center  01/26/2019  8:15 AM Lenn Sink, DPM [facility]  06/14/2019  9:00 AM Quentin Mulling, PA-C [facility] None    ----------------------------------------------------------------------------------------------------------------------  HPI 43 y.o. female with intellectual disability presents accompanied by her mother for 3 month follow up on  cholesterol, obesity, GERD, ADD, anxiety and vitamin D deficiency.   She has fallen 2-3 x in the last 2 months. She is following with Dr. Briant Cedar for her feet and mentioned it to him, now has insoles in both of shoes. However she was told her mother that she will have pain down her right leg and that it will "give out on her". She is on topamax x 6-8 years, unchanged, on  a day.  Lumbar Xray 05/2013 IMPRESSION: No evidence of acute fracture. Scoliosis and degenerative changes with thoracolumbar spinal fixation rods. Bilateral L5 pars defects with grade 1 spondylolisthesis at L5-S1 - likely chronic.   She is managed by psych NP Brock Bad.  She is on topamax for anxiety.   Patient is on an ADD medication, she states that the medication is helping and she denies any adverse reactions. Takes 15 mg adderall once daily in the mornings.   she has a diagnosis of anxiety and is currently on prozac 20 mg daily and has lorazepam but NEVER takes it reports symptoms are well controlled on current regimen.     BMI is Body mass index is 31.73 kg/m., she has not been working on diet but does exercise - taking walks with mom.  Wt Readings from Last 3 Encounters:  12/08/18 139 lb (63 kg)  09/30/18 140 lb 9.6 oz (63.8 kg)  08/22/18 137 lb 12.8 oz (62.5 kg)   Today their BP is BP: 120/76  She does workout. She denies chest pain, shortness of breath, dizziness.   She is not on cholesterol medication and denies myalgias. Her cholesterol is not at goal. The cholesterol last visit was:   Lab Results  Component Value Date   CHOL 200 (H)  06/06/2018   HDL 67 06/06/2018   LDLCALC 110 (H) 06/06/2018   TRIG 121 06/06/2018   CHOLHDL 3.0 06/06/2018    She has been working on exercise for glucose management, and denies foot ulcerations, increased appetite, nausea, paresthesia of the feet, polydipsia, polyuria, visual disturbances, vomiting and weight loss. Last A1C in the office was:  Lab Results   Component Value Date   HGBA1C 5.5 06/06/2018   Patient is on Vitamin D supplement intermittently:   Lab Results  Component Value Date   VD25OH 17 (L) 12/01/2017        Current Medications:  Current Outpatient Medications on File Prior to Visit  Medication Sig  . amphetamine-dextroamphetamine (ADDERALL XR) 15 MG 24 hr capsule Take 15 mg by mouth every morning.  Marland Kitchen FLUoxetine (PROZAC) 20 MG capsule Take 60 mg by mouth every morning.  Marland Kitchen LORazepam (ATIVAN) 0.5 MG tablet Take 0.5 mg by mouth every 6 (six) hours as needed. Reported on 10/17/2015  . meloxicam (MOBIC) 15 MG tablet Take 1 tablet (15 mg total) by mouth as needed.  . mupirocin ointment (BACTROBAN) 2 % Place 1 application into the nose 2 (two) times daily. (Patient taking differently: Place 1 application into the nose as needed. )  . topiramate (TOPAMAX) 100 MG tablet Take 100 mg by mouth 2 (two) times daily.   No current facility-administered medications on file prior to visit.      Allergies:  Allergies  Allergen Reactions  . Maxitrol [Neomycin-Polymyxin-Dexameth]      Medical History:  Past Medical History:  Diagnosis Date  . ADD (attention deficit disorder)   . Hyperlipidemia   . Mental retardation   . OCD (obsessive compulsive disorder)   . Unspecified vitamin D deficiency    Family history- Reviewed and unchanged Social history- Reviewed and unchanged   Review of Systems:  Review of Systems  Constitutional: Negative for malaise/fatigue and weight loss.  HENT: Negative for hearing loss and tinnitus.   Eyes: Negative for blurred vision and double vision.  Respiratory: Negative for cough, shortness of breath and wheezing.   Cardiovascular: Negative for chest pain, palpitations, orthopnea, claudication, leg swelling and PND.  Gastrointestinal: Negative for abdominal pain, blood in stool, constipation, diarrhea, heartburn, melena, nausea and vomiting.  Genitourinary: Negative.   Musculoskeletal: Positive for  back pain and falls. Negative for joint pain and myalgias.  Skin: Negative for rash.  Neurological: Negative for dizziness, tingling, sensory change, weakness and headaches.  Endo/Heme/Allergies: Negative for polydipsia.  Psychiatric/Behavioral: Negative.   All other systems reviewed and are negative.   Physical Exam: BP 120/76   Pulse 66   Temp 98.3 F (36.8 C)   Ht 4' 7.5" (1.41 m)   Wt 139 lb (63 kg)   SpO2 95%   BMI 31.73 kg/m  Wt Readings from Last 3 Encounters:  12/08/18 139 lb (63 kg)  09/30/18 140 lb 9.6 oz (63.8 kg)  08/22/18 137 lb 12.8 oz (62.5 kg)   General Appearance: Well nourished, in no apparent distress. Eyes: PERRLA, EOMs, conjunctiva no swelling or erythema Sinuses: No Frontal/maxillary tenderness ENT/Mouth: Ext aud canals clear, TMs without erythema, bulging. No erythema, swelling, or exudate on post pharynx.  Tonsils not swollen or erythematous. Hearing normal.  Neck: Supple, thyroid normal.  Respiratory: Respiratory effort normal, BS equal bilaterally without rales, rhonchi, wheezing or stridor.  Cardio: RRR with no MRGs. Brisk peripheral pulses without edema.  Abdomen: Soft, + BS.  Non tender, no guarding, rebound, hernias, masses. Lymphatics: Non  tender without lymphadenopathy.  Musculoskeletal: Full ROM, 5/5 strength, negative straight leg, waddling gait Skin: Warm, dry without rashes, lesions, ecchymosis.  Neuro: Cranial nerves intact. No cerebellar symptoms.  Psych: Awake and oriented X 2, normal affect, Insight and Judgment poor.    Quentin MullingAmanda Collier, PA-C 3:47 PM [facility]

## 2018-12-08 ENCOUNTER — Ambulatory Visit: Payer: BLUE CROSS/BLUE SHIELD | Admitting: Physician Assistant

## 2018-12-08 ENCOUNTER — Other Ambulatory Visit: Payer: Self-pay | Admitting: Physician Assistant

## 2018-12-08 ENCOUNTER — Encounter: Payer: Self-pay | Admitting: Physician Assistant

## 2018-12-08 ENCOUNTER — Other Ambulatory Visit: Payer: Self-pay

## 2018-12-08 ENCOUNTER — Ambulatory Visit
Admission: RE | Admit: 2018-12-08 | Discharge: 2018-12-08 | Disposition: A | Discharge status: Home / Self Care | Payer: BLUE CROSS/BLUE SHIELD | Source: Ambulatory Visit | Attending: Physician Assistant | Admitting: Physician Assistant

## 2018-12-08 VITALS — BP 120/76 | HR 66 | Temp 98.3°F | Ht <= 58 in | Wt 139.0 lb

## 2018-12-08 DIAGNOSIS — M79605 Pain in left leg: Secondary | ICD-10-CM

## 2018-12-08 DIAGNOSIS — E559 Vitamin D deficiency, unspecified: Secondary | ICD-10-CM | POA: Diagnosis not present

## 2018-12-08 DIAGNOSIS — R7309 Other abnormal glucose: Secondary | ICD-10-CM

## 2018-12-08 DIAGNOSIS — M545 Low back pain: Secondary | ICD-10-CM | POA: Diagnosis not present

## 2018-12-08 DIAGNOSIS — M79604 Pain in right leg: Secondary | ICD-10-CM

## 2018-12-08 DIAGNOSIS — F79 Unspecified intellectual disabilities: Secondary | ICD-10-CM | POA: Diagnosis not present

## 2018-12-08 DIAGNOSIS — F988 Other specified behavioral and emotional disorders with onset usually occurring in childhood and adolescence: Secondary | ICD-10-CM

## 2018-12-08 DIAGNOSIS — F419 Anxiety disorder, unspecified: Secondary | ICD-10-CM

## 2018-12-08 DIAGNOSIS — E785 Hyperlipidemia, unspecified: Secondary | ICD-10-CM

## 2018-12-08 DIAGNOSIS — Z79899 Other long term (current) drug therapy: Secondary | ICD-10-CM

## 2018-12-08 MED ORDER — MELOXICAM 7.5 MG PO TABS: 7.5000 mg | ORAL_TABLET | Freq: Two times a day (BID) | ORAL | 1 refills | Status: DC | PRN

## 2018-12-08 NOTE — Patient Instructions (Addendum)
INFORMATION ABOUT YOUR XRAY  Can walk into [address] building for an Personal assistant. They will have the order and take you back. You do not any paper work, I should get the result back today or tomorrow. This order is good for a year.     BACK PAIN  Try the exercises below, will decide to send to ortho or do PT after the xray Go to the ER if you have any new weakness in your legs, have trouble controlling your urine or bowels, or have worsening pain. If you are not better in 1-3 month we will refer you to ortho   Back pain Rehab Ask your health care provider which exercises are safe for you. Do exercises exactly as told by your health care provider and adjust them as directed. It is normal to feel mild stretching, pulling, tightness, or discomfort as you do these exercises, but you should stop right away if you feel sudden pain or your pain gets worse. Do not begin these exercises until told by your health care provider. Stretching and range of motion exercises These exercises warm up your muscles and joints and improve the movement and flexibility of your hips and your back. These exercises also help to relieve pain, numbness, and tingling. Exercise A: Sciatic nerve glide 1. Sit in a chair with your head facing down toward your chest. Place your hands behind your back. Let your shoulders slump forward. 2. Slowly straighten one of your knees while you tilt your head back as if you are looking toward the ceiling. Only straighten your leg as far as you can without making your symptoms worse. 3. Hold for __________ seconds. 4. Slowly return to the starting position. 5. Repeat with your other leg. Repeat __________ times. Complete this exercise __________ times a day. Exercise B: Knee to chest with hip adduction and internal rotation  1. Lie on your back on a firm surface with both legs straight. 2. Bend one of your knees and move it up toward your chest until you feel a gentle stretch in your  lower back and buttock. Then, move your knee toward the shoulder that is on the opposite side from your leg. ? Hold your leg in this position by holding onto the front of your knee. 3. Hold for __________ seconds. 4. Slowly return to the starting position. 5. Repeat with your other leg. Repeat __________ times. Complete this exercise __________ times a day. Exercise C: Prone extension on elbows  1. Lie on your abdomen on a firm surface. A bed may be too soft for this exercise. 2. Prop yourself up on your elbows. 3. Use your arms to help lift your chest up until you feel a gentle stretch in your abdomen and your lower back. ? This will place some of your body weight on your elbows. If this is uncomfortable, try stacking pillows under your chest. ? Your hips should stay down, against the surface that you are lying on. Keep your hip and back muscles relaxed. 4. Hold for __________ seconds. 5. Slowly relax your upper body and return to the starting position. Repeat __________ times. Complete this exercise __________ times a day. Strengthening exercises These exercises build strength and endurance in your back. Endurance is the ability to use your muscles for a long time, even after they get tired. Exercise D: Pelvic tilt 1. Lie on your back on a firm surface. Bend your knees and keep your feet flat. 2. Tense your abdominal muscles. Tip  your pelvis up toward the ceiling and flatten your lower back into the floor. ? To help with this exercise, you may place a small towel under your lower back and try to push your back into the towel. 3. Hold for __________ seconds. 4. Let your muscles relax completely before you repeat this exercise. Repeat __________ times. Complete this exercise __________ times a day. Exercise E: Alternating arm and leg raises  1. Get on your hands and knees on a firm surface. If you are on a hard floor, you may want to use padding to cushion your knees, such as an exercise  mat. 2. Line up your arms and legs. Your hands should be below your shoulders, and your knees should be below your hips. 3. Lift your left leg behind you. At the same time, raise your right arm and straighten it in front of you. ? Do not lift your leg higher than your hip. ? Do not lift your arm higher than your shoulder. ? Keep your abdominal and back muscles tight. ? Keep your hips facing the ground. ? Do not arch your back. ? Keep your balance carefully, and do not hold your breath. 4. Hold for __________ seconds. 5. Slowly return to the starting position and repeat with your right leg and your left arm. Repeat __________ times. Complete this exercise __________ times a day. Posture and body mechanics  Body mechanics refers to the movements and positions of your body while you do your daily activities. Posture is part of body mechanics. Good posture and healthy body mechanics can help to relieve stress in your body's tissues and joints. Good posture means that your spine is in its natural S-curve position (your spine is neutral), your shoulders are pulled back slightly, and your head is not tipped forward. The following are general guidelines for applying improved posture and body mechanics to your everyday activities. Standing   When standing, keep your spine neutral and your feet about hip-width apart. Keep a slight bend in your knees. Your ears, shoulders, and hips should line up.  When you do a task in which you stand in one place for a long time, place one foot up on a stable object that is 2-4 inches (5-10 cm) high, such as a footstool. This helps keep your spine neutral. Sitting   When sitting, keep your spine neutral and keep your feet flat on the floor. Use a footrest, if necessary, and keep your thighs parallel to the floor. Avoid rounding your shoulders, and avoid tilting your head forward.  When working at a desk or a computer, keep your desk at a height where your hands are  slightly lower than your elbows. Slide your chair under your desk so you are close enough to maintain good posture.  When working at a computer, place your monitor at a height where you are looking straight ahead and you do not have to tilt your head forward or downward to look at the screen. Resting   When lying down and resting, avoid positions that are most painful for you.  If you have pain with activities such as sitting, bending, stooping, or squatting (flexion-based activities), lie in a position in which your body does not bend very much. For example, avoid curling up on your side with your arms and knees near your chest (fetal position).  If you have pain with activities such as standing for a long time or reaching with your arms (extension-based activities), lie with your spine  in a neutral position and bend your knees slightly. Try the following positions: ? Lying on your side with a pillow between your knees. ? Lying on your back with a pillow under your knees. Lifting   When lifting objects, keep your feet at least shoulder-width apart and tighten your abdominal muscles.  Bend your knees and hips and keep your spine neutral. It is important to lift using the strength of your legs, not your back. Do not lock your knees straight out.  Always ask for help to lift heavy or awkward objects. This information is not intended to replace advice given to you by your health care provider. Make sure you discuss any questions you have with your health care provider. Document Released: 07/06/2005 Document Revised: 03/12/2016 Document Reviewed: 03/22/2015 Elsevier Interactive Patient Education  2018 [employer].    VITAMIN D IS IMPORTANT  Vitamin D goal is between 60-80  Please make sure that you are taking your Vitamin D as directed.   It is very important as a natural anti-inflammatory   helping hair, skin, and nails, as well as reducing stroke and heart attack risk.   It helps  your bones and helps with mood.  We want you on at least 5000 IU daily  It also decreases numerous cancer risks so please take it as directed.   Low Vit D is associated with a 200-300% higher risk for CANCER   and 200-300% higher risk for HEART   ATTACK  &  STROKE.    .....................................Marland Kitchen  It is also associated with higher death rate at younger ages,   autoimmune diseases like Rheumatoid arthritis, Lupus, Multiple Sclerosis.     Also many other serious conditions, like depression, Alzheimer's  Dementia, infertility, muscle aches, fatigue, fibromyalgia - just to name a few.  +++++++++++++++++++  Can get liquid vitamin D from [facility]  OR here in [place] at  [facility] alternatives [address], [place], [place] [address] Or you can try [employer]

## 2018-12-09 ENCOUNTER — Ambulatory Visit: Payer: Self-pay | Admitting: Physician Assistant

## 2018-12-09 LAB — CBC WITH DIFFERENTIAL/PLATELET
Absolute Monocytes: 441 cells/uL (ref 200–950)
Basophils Absolute: 28 cells/uL (ref 0–200)
Basophils Relative: 0.4 %
Eosinophils Absolute: 245 cells/uL (ref 15–500)
Eosinophils Relative: 3.5 %
HCT: 38.9 % (ref 35.0–45.0)
Hemoglobin: 12.9 g/dL (ref 11.7–15.5)
Lymphs Abs: 1239 cells/uL (ref 850–3900)
MCH: 31 pg (ref 27.0–33.0)
MCHC: 33.2 g/dL (ref 32.0–36.0)
MCV: 93.5 fL (ref 80.0–100.0)
MPV: 9.4 fL (ref 7.5–12.5)
Monocytes Relative: 6.3 %
Neutro Abs: 5047 cells/uL (ref 1500–7800)
Neutrophils Relative %: 72.1 %
Platelets: 316 10*3/uL (ref 140–400)
RBC: 4.16 10*6/uL (ref 3.80–5.10)
RDW: 11.6 % (ref 11.0–15.0)
Total Lymphocyte: 17.7 %
WBC: 7 10*3/uL (ref 3.8–10.8)

## 2018-12-09 LAB — COMPLETE METABOLIC PANEL WITH GFR
AG Ratio: 2.1 (calc) (ref 1.0–2.5)
ALT: 8 U/L (ref 6–29)
AST: 14 U/L (ref 10–30)
Albumin: 4.4 g/dL (ref 3.6–5.1)
Alkaline phosphatase (APISO): 69 U/L (ref 31–125)
BUN: 16 mg/dL (ref 7–25)
CO2: 30 mmol/L (ref 20–32)
Calcium: 10 mg/dL (ref 8.6–10.2)
Chloride: 102 mmol/L (ref 98–110)
Creat: 0.62 mg/dL (ref 0.50–1.10)
GFR, Est African American: 130 mL/min/{1.73_m2} (ref >=60)
GFR, Est Non African American: 112 mL/min/{1.73_m2} (ref >=60)
Globulin: 2.1 g/dL (calc) (ref 1.9–3.7)
Glucose, Bld: 96 mg/dL (ref 65–99)
Potassium: 4.1 mmol/L (ref 3.5–5.3)
Sodium: 138 mmol/L (ref 135–146)
Total Bilirubin: 0.4 mg/dL (ref 0.2–1.2)
Total Protein: 6.5 g/dL (ref 6.1–8.1)

## 2018-12-09 LAB — HEMOGLOBIN A1C
Hgb A1c MFr Bld: 5.4 % of total Hgb (ref <5.7)
Mean Plasma Glucose: 108 (calc)
eAG (mmol/L): 6 (calc)

## 2018-12-09 LAB — VITAMIN D 25 HYDROXY (VIT D DEFICIENCY, FRACTURES): Vit D, 25-Hydroxy: 30 ng/mL (ref 30–100)

## 2018-12-09 LAB — LIPID PANEL
Cholesterol: 189 mg/dL (ref <200)
HDL: 65 mg/dL (ref >=50)
LDL Cholesterol (Calc): 110 mg/dL (calc) — ABNORMAL HIGH
Non-HDL Cholesterol (Calc): 124 mg/dL (calc) (ref <130)
Total CHOL/HDL Ratio: 2.9 (calc) (ref <5.0)
Triglycerides: 49 mg/dL (ref <150)

## 2018-12-09 LAB — MAGNESIUM: Magnesium: 1.8 mg/dL (ref 1.5–2.5)

## 2018-12-09 LAB — TSH: TSH: 0.84 mIU/L

## 2018-12-13 ENCOUNTER — Other Ambulatory Visit: Payer: Self-pay | Admitting: Physician Assistant

## 2018-12-13 DIAGNOSIS — M79605 Pain in left leg: Secondary | ICD-10-CM

## 2018-12-19 ENCOUNTER — Ambulatory Visit: Payer: BLUE CROSS/BLUE SHIELD | Admitting: Orthopedic Surgery

## 2018-12-22 ENCOUNTER — Other Ambulatory Visit: Payer: Self-pay

## 2018-12-22 ENCOUNTER — Ambulatory Visit (INDEPENDENT_AMBULATORY_CARE_PROVIDER_SITE_OTHER): Payer: BLUE CROSS/BLUE SHIELD | Admitting: Orthopedic Surgery

## 2018-12-22 ENCOUNTER — Encounter: Payer: Self-pay | Admitting: Orthopedic Surgery

## 2018-12-22 VITALS — Ht <= 58 in | Wt 139.0 lb

## 2018-12-22 DIAGNOSIS — Q6589 Other specified congenital deformities of hip: Secondary | ICD-10-CM | POA: Diagnosis not present

## 2018-12-22 NOTE — Progress Notes (Signed)
Office Visit Note   Patient: Cathy Howard           Date of Birth: Sep 29, 1975           MRN: [id number] Visit Date: 12/22/2018              Requested by: Quentin Mulling, PA-C [address] [place], [place] [address] PCP: Lucky Cowboy, MD  Chief Complaint  Patient presents with  . Right Leg - Pain  . Left Leg - Pain      HPI: Patient is a 43 year old woman who was seen for examination of bilateral hip pain with her mother.  Patient has had radiographs of her lumbar spine and this includes the hip joints.  Patient states she has had several years of difficulty weightbearing and walking.  Patient has fallen several times last year.  Assessment & Plan: Visit Diagnoses:  1. Hip dysplasia, congenital     Plan: Discussed with the mother patient has advanced arthritic changes of both hip joints with subchondral cysts complete collapse of the joint with subluxation of the hip joints bilaterally due to congenital dysplasia of the hips.  Patient is currently on Mobic and does have good pain relief with this.  Discussed that if patient's symptoms worsen I would recommend follow-up with Dr. Magnus Ivan to get a full x-ray of the hip and evaluate for a total hip arthroplasty.  Patient does have a femoral head that is 47 mm in diameter and she does have normal bony anatomy for a standard total hip.  Follow-Up Instructions: Return if symptoms worsen or fail to improve.   Ortho Exam  Patient is alert, oriented, no adenopathy, well-dressed, normal affect, normal respiratory effort. Examination patient has difficulty getting from the sitting position to the stool to the floor without assistance.  She does have an antalgic gait bilaterally.  She does have a pelvic tilt secondary to her spinal instrumentation fusion.  Patient does have good range of motion of both hips but has pain reproduced with range of motion of both hips.  She has a negative straight leg raise bilaterally and no  focal motor weakness in either lower extremity.  Radiographs are reviewed which shows advanced degenerative changes of both hips with subchondral sclerosis subcondylar cysts with hip dysplasia bilaterally.  By report from the mother she has had hip surgery as a child on the left but I cannot see any indication for bony osteotomies.  Her femoral head is 47 mm in diameter and she does have normal acetabular and femoral head size.  Imaging: No results found. No images are attached to the encounter.  Labs: Lab Results  Component Value Date   HGBA1C 5.4 12/08/2018   HGBA1C 5.5 06/06/2018   HGBA1C 5.2 04/14/2016     Lab Results  Component Value Date   ALBUMIN 4.4 04/14/2016   ALBUMIN 4.2 01/15/2016   ALBUMIN 4.3 10/17/2015    Body mass index is 31.73 kg/m.  Orders:  No orders of the defined types were placed in this encounter.  No orders of the defined types were placed in this encounter.    Procedures: No procedures performed  Clinical Data: No additional findings.  ROS:  All other systems negative, except as noted in the HPI. Review of Systems  Objective: Vital Signs: Ht 4' 7.5" (1.41 m)   Wt 139 lb (63 kg)   BMI 31.73 kg/m   Specialty Comments:  No specialty comments available.  PMFS History: Patient Active Problem List  Diagnosis Date Noted  . Anxiety 11/29/2017  . Acid reflux 11/29/2017  . Obesity (BMI 30.0-34.9) 11/29/2017  . Morbid obesity (HCC) 01/15/2016  . Vitamin D deficiency   . ADD (attention deficit disorder)   . Intellectual disability   . Hyperlipidemia    Past Medical History:  Diagnosis Date  . ADD (attention deficit disorder)   . Hyperlipidemia   . Mental retardation   . OCD (obsessive compulsive disorder)   . Unspecified vitamin D deficiency     Family History  Problem Relation Age of Onset  . Cancer Mother        basal cell  . Hyperlipidemia Mother   . Heart disease Mother   . Stroke Maternal Grandmother   . Diabetes  Maternal Grandmother   . Heart disease Maternal Grandmother     Past Surgical History:  Procedure Laterality Date  . EYE SURGERY    . HIP SURGERY    . scoliosis surgery     Social History   Occupational History  . Not on file  Tobacco Use  . Smoking status: Never Smoker  . Smokeless tobacco: Never Used  Substance and Sexual Activity  . Alcohol use: No  . Drug use: No  . Sexual activity: Not on file

## 2018-12-28 DIAGNOSIS — M160 Bilateral primary osteoarthritis of hip: Secondary | ICD-10-CM | POA: Diagnosis not present

## 2019-01-05 DIAGNOSIS — F422 Mixed obsessional thoughts and acts: Secondary | ICD-10-CM | POA: Diagnosis not present

## 2019-01-25 DIAGNOSIS — M25559 Pain in unspecified hip: Secondary | ICD-10-CM | POA: Diagnosis not present

## 2019-01-25 DIAGNOSIS — M47816 Spondylosis without myelopathy or radiculopathy, lumbar region: Secondary | ICD-10-CM | POA: Diagnosis not present

## 2019-01-25 DIAGNOSIS — Q6589 Other specified congenital deformities of hip: Secondary | ICD-10-CM | POA: Insufficient documentation

## 2019-01-25 DIAGNOSIS — R6250 Unspecified lack of expected normal physiological development in childhood: Secondary | ICD-10-CM | POA: Insufficient documentation

## 2019-01-25 DIAGNOSIS — M25561 Pain in right knee: Secondary | ICD-10-CM | POA: Diagnosis not present

## 2019-01-25 DIAGNOSIS — M25562 Pain in left knee: Secondary | ICD-10-CM | POA: Diagnosis not present

## 2019-01-25 DIAGNOSIS — M160 Bilateral primary osteoarthritis of hip: Secondary | ICD-10-CM | POA: Diagnosis not present

## 2019-01-25 HISTORY — DX: Other specified congenital deformities of hip: Q65.89

## 2019-01-26 ENCOUNTER — Ambulatory Visit: Payer: BLUE CROSS/BLUE SHIELD | Admitting: Podiatry

## 2019-01-27 ENCOUNTER — Encounter: Payer: Self-pay | Admitting: Podiatry

## 2019-01-27 ENCOUNTER — Other Ambulatory Visit: Payer: Self-pay

## 2019-01-27 ENCOUNTER — Ambulatory Visit (INDEPENDENT_AMBULATORY_CARE_PROVIDER_SITE_OTHER): Payer: BC Managed Care – PPO | Admitting: Podiatry

## 2019-01-27 VITALS — Temp 98.3°F

## 2019-01-27 DIAGNOSIS — M79676 Pain in unspecified toe(s): Secondary | ICD-10-CM

## 2019-01-27 DIAGNOSIS — B351 Tinea unguium: Secondary | ICD-10-CM

## 2019-01-27 NOTE — Patient Instructions (Signed)

## 2019-01-29 NOTE — Progress Notes (Signed)
Subjective: Cathy Howard presents today, accompanied by her mother,  with painful, thick toenails 1-5 b/l that she cannot cut and which interfere with daily activities.  Pain is aggravated when wearing enclosed shoe gear.  Unk Pinto, MD is her PCP.    Current Outpatient Medications:  .  amphetamine-dextroamphetamine (ADDERALL XR) 15 MG 24 hr capsule, Take 15 mg by mouth every morning., Disp: , Rfl:  .  FLUoxetine (PROZAC) 20 MG capsule, Take 60 mg by mouth every morning., Disp: , Rfl: 5 .  LORazepam (ATIVAN) 0.5 MG tablet, Take 0.5 mg by mouth every 6 (six) hours as needed. Reported on 10/17/2015, Disp: , Rfl: 5 .  meloxicam (MOBIC) 7.5 MG tablet, Take 1 tablet (7.5 mg total) by mouth 2 (two) times daily as needed for pain (okay to take with tylenol, not aleve, ibuprofen)., Disp: 180 tablet, Rfl: 1 .  mupirocin ointment (BACTROBAN) 2 %, Place 1 application into the nose 2 (two) times daily. (Patient taking differently: Place 1 application into the nose as needed. ), Disp: 22 g, Rfl: 0 .  topiramate (TOPAMAX) 100 MG tablet, Take 100 mg by mouth 2 (two) times daily., Disp: , Rfl:   Allergies  Allergen Reactions  . Maxitrol [Neomycin-Polymyxin-Dexameth]     Objective: Vitals:   01/27/19 1358  Temp: 98.3 F (36.8 C)    Vascular Examination: Capillary refill time immediate x 10 digits.  Dorsalis pedis and Posterior tibial pulses palpable b/l.  Digital hair present x 10 digits.  Skin temperature gradient WNL b/l  Dermatological Examination: Skin with normal turgor, texture and tone b/l.  Toenails 1-5 b/l discolored, thick, dystrophic with subungual debris and pain with palpation to nailbeds due to thickness of nails.  Musculoskeletal: Muscle strength 5/5 to all LE muscle groups  Pes planus foot deformity b/l.  HAV with bunion deformity b/l.  Flexible hammertoe deformity lesser digits b/l.  Neurological: Unable to assess sensation due to cognition.    Assessment: Painful onychomycosis toenails 1-5 b/l   Plan: 1. Toenails 1-5 b/l were debrided in length and girth without iatrogenic bleeding. 2. Patient to continue soft, supportive shoe gear daily. 3. Patient to report any pedal injuries to medical professional immediately. 4. Follow up 3 months.  5. Patient/POA to call should there be a concern in the interim.

## 2019-02-15 ENCOUNTER — Encounter: Payer: Self-pay | Admitting: Adult Health

## 2019-02-15 ENCOUNTER — Other Ambulatory Visit: Payer: Self-pay

## 2019-02-15 ENCOUNTER — Ambulatory Visit: Payer: BC Managed Care – PPO | Admitting: Adult Health

## 2019-02-15 VITALS — BP 94/72 | HR 74 | Temp 99.4°F

## 2019-02-15 DIAGNOSIS — J0110 Acute frontal sinusitis, unspecified: Secondary | ICD-10-CM | POA: Diagnosis not present

## 2019-02-15 MED ORDER — PREDNISONE 20 MG PO TABS: ORAL_TABLET | ORAL | 0 refills | Status: DC

## 2019-02-15 MED ORDER — AZITHROMYCIN 250 MG PO TABS: ORAL_TABLET | ORAL | 1 refills | Status: AC

## 2019-02-15 MED ORDER — FLUTICASONE PROPIONATE 50 MCG/ACT NA SUSP: 2.0000 | Freq: Every day | NASAL | 1 refills | Status: DC

## 2019-02-15 NOTE — Progress Notes (Signed)
Virtual Visit via Telephone Note  I connected with Cathy Howard on 02/15/19 at  4:30 PM EDT by telephone and verified that I am speaking with the correct person using two identifiers.  Location: Patient: home Provider: [place] office   I discussed the limitations, risks, security and privacy concerns of performing an evaluation and management service by telephone and the availability of in person appointments. I also discussed with the patient that there may be a patient responsible charge related to this service. The patient expressed understanding and agreed to proceed.   History of Present Illness:  BP 94/72   Pulse 74   Temp 98.9 F (37.2 C)   43 y.o. female patient with intellectual disability, seasonal allergies, with mild temp new onset frontal headache today; spoke with patient briefly but mainly communicated by her caregiver mother.   Low grade fever ~99.7 on and off for the last week, clear nasal drainage, new onset frontal headache sinus pressure today; has taken tylenol this afternoon which was helpful.    Denies sore throat, cough, dizziness, dyspnea, wheezing, chest pain, rash, body aches; denies neck stiffness  She takes cetirizine daily    Current Outpatient Medications on File Prior to Visit  Medication Sig Dispense Refill  . amphetamine-dextroamphetamine (ADDERALL XR) 15 MG 24 hr capsule Take 15 mg by mouth every morning.    . ASPIRIN 81 PO Take by mouth daily.    Marland Kitchen FLUoxetine (PROZAC) 20 MG capsule Take 60 mg by mouth every morning.  5  . LORazepam (ATIVAN) 0.5 MG tablet Take 0.5 mg by mouth every 6 (six) hours as needed. Reported on 10/17/2015  5  . Magnesium 250 MG TABS Take by mouth daily.    . meloxicam (MOBIC) 7.5 MG tablet Take 1 tablet (7.5 mg total) by mouth 2 (two) times daily as needed for pain (okay to take with tylenol, not aleve, ibuprofen). 180 tablet 1  . Multiple Vitamin (MULTIVITAMIN) tablet Take 1 tablet by mouth daily.    . mupirocin ointment  (BACTROBAN) 2 % Place 1 application into the nose 2 (two) times daily. (Patient taking differently: Place 1 application into the nose as needed. ) 22 g 0  . topiramate (TOPAMAX) 100 MG tablet Take 100 mg by mouth 2 (two) times daily.    . vitamin C (ASCORBIC ACID) 500 MG tablet Take 500 mg by mouth daily.    Marland Kitchen VITAMIN D, CHOLECALCIFEROL, PO Take 5,000 Units by mouth daily.     No current facility-administered medications on file prior to visit.      Allergies:  Allergies  Allergen Reactions  . Maxitrol [Neomycin-Polymyxin-Dexameth]    Medical History:  has Vitamin D deficiency; ADD (attention deficit disorder); Intellectual disability; Hyperlipidemia; Morbid obesity ([place]); Anxiety; Acid reflux; Obesity (BMI 30.0-34.9); Arthritis, lumbar spine; Developmental delay; Hip dysplasia, congenital; and Pain in both knees on their problem list. Surgical History:  She  has a past surgical history that includes Hip surgery; scoliosis surgery; and Eye surgery. Family History:  Herfamily history includes Cancer in her mother; Diabetes in her maternal grandmother; Heart disease in her maternal grandmother and mother; Hyperlipidemia in her mother; Stroke in her maternal grandmother. Social History:   reports that she has never smoked. She has never used smokeless tobacco. She reports that she does not drink alcohol or use drugs.    Observations/Objective:  General : Well sounding patient in no apparent distress HEENT: no hoarseness, no cough for duration of visit Lungs: speaks without distress, no audible  wheezing, no apparent distress Neurological: alert, oriented consistent with baseline Psychiatric: pleasant, judgement appropriate    Assessment and Plan:  Cathy Howard was seen today for fever and headache.  Diagnoses and all orders for this visit:  Acute non-recurrent frontal sinusitis Discussed the importance of avoiding unnecessary antibiotic therapy. Suggested symptomatic OTC remedies, then if  no improvement in 2-3 days if no improvement or fever is progressive start abx Nasal saline spray for congestion, sudafed or similar OTC decongestant if tolerated Nasal steroids, allergy pill, oral steroids offered  Follow up as needed. -     predniSONE (DELTASONE) 20 MG tablet; 2 tablets daily for 3 days, 1 tablet daily for 4 days. -     azithromycin (ZITHROMAX) 250 MG tablet; Take 2 tablets (500 mg) on  Day 1,  followed by 1 tablet (250 mg) once daily on Days 2 through 5. -     fluticasone (FLONASE) 50 MCG/ACT nasal spray; Place 2 sprays into both nostrils at bedtime.   Follow Up Instructions:  I discussed the assessment and treatment plan with the patient/caregiver. The caregiver was provided an opportunity to ask questions and all were answered. The caregiver agreed with the plan and demonstrated an understanding of the instructions.   The caregiver was advised to call back or seek an in-person evaluation if the symptoms worsen or if the condition fails to improve as anticipated.  I provided 15 minutes of non-face-to-face time during this encounter.   Dan MakerAshley C Amarachi Kotz, NP

## 2019-02-23 ENCOUNTER — Other Ambulatory Visit: Payer: Self-pay | Admitting: Adult Health

## 2019-02-23 MED ORDER — DOXYCYCLINE HYCLATE 100 MG PO CAPS: ORAL_CAPSULE | ORAL | 0 refills | Status: DC

## 2019-02-27 DIAGNOSIS — M545 Low back pain: Secondary | ICD-10-CM | POA: Diagnosis not present

## 2019-02-27 DIAGNOSIS — M25561 Pain in right knee: Secondary | ICD-10-CM | POA: Diagnosis not present

## 2019-02-27 DIAGNOSIS — M4325 Fusion of spine, thoracolumbar region: Secondary | ICD-10-CM | POA: Insufficient documentation

## 2019-02-27 DIAGNOSIS — M25559 Pain in unspecified hip: Secondary | ICD-10-CM | POA: Diagnosis not present

## 2019-02-27 DIAGNOSIS — M47816 Spondylosis without myelopathy or radiculopathy, lumbar region: Secondary | ICD-10-CM | POA: Diagnosis not present

## 2019-02-27 DIAGNOSIS — G8929 Other chronic pain: Secondary | ICD-10-CM | POA: Diagnosis not present

## 2019-03-01 DIAGNOSIS — Q6589 Other specified congenital deformities of hip: Secondary | ICD-10-CM | POA: Diagnosis not present

## 2019-03-01 DIAGNOSIS — M25561 Pain in right knee: Secondary | ICD-10-CM | POA: Diagnosis not present

## 2019-03-01 DIAGNOSIS — M1611 Unilateral primary osteoarthritis, right hip: Secondary | ICD-10-CM | POA: Diagnosis not present

## 2019-03-01 DIAGNOSIS — M47816 Spondylosis without myelopathy or radiculopathy, lumbar region: Secondary | ICD-10-CM | POA: Diagnosis not present

## 2019-03-23 DIAGNOSIS — F422 Mixed obsessional thoughts and acts: Secondary | ICD-10-CM | POA: Diagnosis not present

## 2019-04-10 DIAGNOSIS — Q6589 Other specified congenital deformities of hip: Secondary | ICD-10-CM | POA: Diagnosis not present

## 2019-04-10 DIAGNOSIS — M1611 Unilateral primary osteoarthritis, right hip: Secondary | ICD-10-CM | POA: Diagnosis not present

## 2019-04-10 DIAGNOSIS — Z01818 Encounter for other preprocedural examination: Secondary | ICD-10-CM | POA: Diagnosis not present

## 2019-04-10 DIAGNOSIS — Z888 Allergy status to other drugs, medicaments and biological substances status: Secondary | ICD-10-CM | POA: Diagnosis not present

## 2019-04-10 DIAGNOSIS — Z833 Family history of diabetes mellitus: Secondary | ICD-10-CM | POA: Diagnosis not present

## 2019-04-10 DIAGNOSIS — Z6831 Body mass index (BMI) 31.0-31.9, adult: Secondary | ICD-10-CM | POA: Diagnosis not present

## 2019-04-10 DIAGNOSIS — Z01812 Encounter for preprocedural laboratory examination: Secondary | ICD-10-CM | POA: Diagnosis not present

## 2019-04-12 ENCOUNTER — Encounter: Payer: Self-pay | Admitting: Podiatry

## 2019-04-12 ENCOUNTER — Ambulatory Visit (INDEPENDENT_AMBULATORY_CARE_PROVIDER_SITE_OTHER): Payer: BC Managed Care – PPO | Admitting: Podiatry

## 2019-04-12 ENCOUNTER — Other Ambulatory Visit: Payer: Self-pay

## 2019-04-12 DIAGNOSIS — M79675 Pain in left toe(s): Secondary | ICD-10-CM

## 2019-04-12 DIAGNOSIS — Q6650 Congenital pes planus, unspecified foot: Secondary | ICD-10-CM

## 2019-04-12 DIAGNOSIS — M76821 Posterior tibial tendinitis, right leg: Secondary | ICD-10-CM

## 2019-04-12 DIAGNOSIS — B351 Tinea unguium: Secondary | ICD-10-CM

## 2019-04-12 DIAGNOSIS — M79674 Pain in right toe(s): Secondary | ICD-10-CM | POA: Diagnosis not present

## 2019-04-12 NOTE — Progress Notes (Signed)
Complaint:  Visit Type: Patient returns to my office for continued preventative foot care services. Complaint: Patient states" my nails have grown long and thick and become painful to walk and wear shoes" Patient is scheduled for hip surgery in October.  . The patient presents for preventative foot care services. No changes to ROS  Podiatric Exam: Vascular: dorsalis pedis and posterior tibial pulses are palpable bilateral. Capillary return is immediate. Temperature gradient is WNL. Skin turgor WNL  Sensorium: Normal Semmes Weinstein monofilament test. Normal tactile sensation bilaterally. Nail Exam: Pt has thick disfigured discolored nails with subungual debris noted bilateral entire nail hallux through fifth toenails Ulcer Exam: There is no evidence of ulcer or pre-ulcerative changes or infection. Orthopedic Exam: Muscle tone and strength are WNL. No limitations in general ROM. No crepitus or effusions noted. Foot type and digits show no abnormalities .  HAV  B/L.  Hammer toes  B/L.  Pes planus  B/L.  Skin: No Porokeratosis. No infection or ulcers  Diagnosis:  Onychomycosis, , Pain in right toe, pain in left toes  Treatment & Plan Procedures and Treatment: Consent by patient was obtained for treatment procedures.   Debridement of mycotic and hypertrophic toenails, 1 through 5 bilateral and clearing of subungual debris. No ulceration, no infection noted.  Return Visit-Office Procedure: Patient instructed to return to the office for a follow up visit prn for continued evaluation and treatment.    Gardiner Barefoot DPM

## 2019-04-24 DIAGNOSIS — M1611 Unilateral primary osteoarthritis, right hip: Secondary | ICD-10-CM | POA: Diagnosis not present

## 2019-04-24 DIAGNOSIS — Q6589 Other specified congenital deformities of hip: Secondary | ICD-10-CM | POA: Diagnosis not present

## 2019-04-24 DIAGNOSIS — Z20828 Contact with and (suspected) exposure to other viral communicable diseases: Secondary | ICD-10-CM | POA: Diagnosis not present

## 2019-04-24 DIAGNOSIS — Z01812 Encounter for preprocedural laboratory examination: Secondary | ICD-10-CM | POA: Diagnosis not present

## 2019-04-25 DIAGNOSIS — Z01818 Encounter for other preprocedural examination: Secondary | ICD-10-CM | POA: Diagnosis not present

## 2019-04-25 DIAGNOSIS — Q6589 Other specified congenital deformities of hip: Secondary | ICD-10-CM | POA: Diagnosis not present

## 2019-04-25 DIAGNOSIS — M160 Bilateral primary osteoarthritis of hip: Secondary | ICD-10-CM | POA: Diagnosis not present

## 2019-04-25 DIAGNOSIS — Z7409 Other reduced mobility: Secondary | ICD-10-CM | POA: Diagnosis not present

## 2019-04-25 DIAGNOSIS — M1611 Unilateral primary osteoarthritis, right hip: Secondary | ICD-10-CM | POA: Diagnosis not present

## 2019-04-25 DIAGNOSIS — M25551 Pain in right hip: Secondary | ICD-10-CM | POA: Diagnosis not present

## 2019-04-28 ENCOUNTER — Ambulatory Visit: Payer: BC Managed Care – PPO | Admitting: Podiatry

## 2019-05-01 DIAGNOSIS — M4325 Fusion of spine, thoracolumbar region: Secondary | ICD-10-CM | POA: Diagnosis not present

## 2019-05-01 DIAGNOSIS — M1632 Unilateral osteoarthritis resulting from hip dysplasia, left hip: Secondary | ICD-10-CM | POA: Diagnosis not present

## 2019-05-01 DIAGNOSIS — Z471 Aftercare following joint replacement surgery: Secondary | ICD-10-CM | POA: Diagnosis not present

## 2019-05-01 DIAGNOSIS — G8918 Other acute postprocedural pain: Secondary | ICD-10-CM | POA: Diagnosis not present

## 2019-05-01 DIAGNOSIS — Z96641 Presence of right artificial hip joint: Secondary | ICD-10-CM | POA: Diagnosis not present

## 2019-05-01 DIAGNOSIS — Z981 Arthrodesis status: Secondary | ICD-10-CM | POA: Diagnosis not present

## 2019-05-01 DIAGNOSIS — M5136 Other intervertebral disc degeneration, lumbar region: Secondary | ICD-10-CM | POA: Diagnosis not present

## 2019-05-01 DIAGNOSIS — R6250 Unspecified lack of expected normal physiological development in childhood: Secondary | ICD-10-CM | POA: Diagnosis not present

## 2019-05-01 DIAGNOSIS — M1611 Unilateral primary osteoarthritis, right hip: Secondary | ICD-10-CM | POA: Diagnosis not present

## 2019-05-01 DIAGNOSIS — Q6589 Other specified congenital deformities of hip: Secondary | ICD-10-CM | POA: Diagnosis not present

## 2019-05-01 HISTORY — PX: HIP ARTHROPLASTY: SHX981

## 2019-05-05 DIAGNOSIS — Z7409 Other reduced mobility: Secondary | ICD-10-CM | POA: Diagnosis not present

## 2019-05-05 DIAGNOSIS — Q6589 Other specified congenital deformities of hip: Secondary | ICD-10-CM | POA: Diagnosis not present

## 2019-05-05 DIAGNOSIS — M25551 Pain in right hip: Secondary | ICD-10-CM | POA: Diagnosis not present

## 2019-05-05 DIAGNOSIS — M1611 Unilateral primary osteoarthritis, right hip: Secondary | ICD-10-CM | POA: Diagnosis not present

## 2019-05-09 DIAGNOSIS — M1611 Unilateral primary osteoarthritis, right hip: Secondary | ICD-10-CM | POA: Diagnosis not present

## 2019-05-09 DIAGNOSIS — Z7409 Other reduced mobility: Secondary | ICD-10-CM | POA: Diagnosis not present

## 2019-05-09 DIAGNOSIS — Q6589 Other specified congenital deformities of hip: Secondary | ICD-10-CM | POA: Diagnosis not present

## 2019-05-09 DIAGNOSIS — M25551 Pain in right hip: Secondary | ICD-10-CM | POA: Diagnosis not present

## 2019-05-12 DIAGNOSIS — M25551 Pain in right hip: Secondary | ICD-10-CM | POA: Diagnosis not present

## 2019-05-15 DIAGNOSIS — Q6589 Other specified congenital deformities of hip: Secondary | ICD-10-CM | POA: Diagnosis not present

## 2019-05-15 DIAGNOSIS — Z7409 Other reduced mobility: Secondary | ICD-10-CM | POA: Diagnosis not present

## 2019-05-15 DIAGNOSIS — M1611 Unilateral primary osteoarthritis, right hip: Secondary | ICD-10-CM | POA: Diagnosis not present

## 2019-05-15 DIAGNOSIS — M25551 Pain in right hip: Secondary | ICD-10-CM | POA: Diagnosis not present

## 2019-05-16 DIAGNOSIS — Z96643 Presence of artificial hip joint, bilateral: Secondary | ICD-10-CM

## 2019-05-16 DIAGNOSIS — Z96641 Presence of right artificial hip joint: Secondary | ICD-10-CM | POA: Insufficient documentation

## 2019-05-16 HISTORY — DX: Presence of artificial hip joint, bilateral: Z96.643

## 2019-05-19 DIAGNOSIS — Z7409 Other reduced mobility: Secondary | ICD-10-CM | POA: Diagnosis not present

## 2019-05-19 DIAGNOSIS — M1611 Unilateral primary osteoarthritis, right hip: Secondary | ICD-10-CM | POA: Diagnosis not present

## 2019-05-19 DIAGNOSIS — M25551 Pain in right hip: Secondary | ICD-10-CM | POA: Diagnosis not present

## 2019-05-19 DIAGNOSIS — Q6589 Other specified congenital deformities of hip: Secondary | ICD-10-CM | POA: Diagnosis not present

## 2019-05-23 DIAGNOSIS — M25551 Pain in right hip: Secondary | ICD-10-CM | POA: Diagnosis not present

## 2019-05-23 DIAGNOSIS — Z7409 Other reduced mobility: Secondary | ICD-10-CM | POA: Diagnosis not present

## 2019-05-23 DIAGNOSIS — Q6589 Other specified congenital deformities of hip: Secondary | ICD-10-CM | POA: Diagnosis not present

## 2019-05-23 DIAGNOSIS — M1611 Unilateral primary osteoarthritis, right hip: Secondary | ICD-10-CM | POA: Diagnosis not present

## 2019-05-26 DIAGNOSIS — R29898 Other symptoms and signs involving the musculoskeletal system: Secondary | ICD-10-CM | POA: Diagnosis not present

## 2019-05-26 DIAGNOSIS — M1611 Unilateral primary osteoarthritis, right hip: Secondary | ICD-10-CM | POA: Diagnosis not present

## 2019-05-26 DIAGNOSIS — Z7409 Other reduced mobility: Secondary | ICD-10-CM | POA: Diagnosis not present

## 2019-05-26 DIAGNOSIS — M25551 Pain in right hip: Secondary | ICD-10-CM | POA: Diagnosis not present

## 2019-05-26 DIAGNOSIS — Q6589 Other specified congenital deformities of hip: Secondary | ICD-10-CM | POA: Diagnosis not present

## 2019-05-26 DIAGNOSIS — Z96641 Presence of right artificial hip joint: Secondary | ICD-10-CM | POA: Diagnosis not present

## 2019-05-29 ENCOUNTER — Encounter: Payer: Self-pay | Admitting: Adult Health

## 2019-05-29 ENCOUNTER — Other Ambulatory Visit: Payer: Self-pay

## 2019-05-29 ENCOUNTER — Ambulatory Visit: Payer: BC Managed Care – PPO | Admitting: Adult Health

## 2019-05-29 VITALS — BP 110/70 | HR 78 | Temp 97.7°F | Ht <= 58 in | Wt 140.0 lb

## 2019-05-29 DIAGNOSIS — J069 Acute upper respiratory infection, unspecified: Secondary | ICD-10-CM | POA: Diagnosis not present

## 2019-05-29 MED ORDER — PROMETHAZINE-DM 6.25-15 MG/5ML PO SYRP: 5.0000 mL | ORAL_SOLUTION | Freq: Four times a day (QID) | ORAL | 1 refills | Status: DC | PRN

## 2019-05-29 MED ORDER — ALBUTEROL SULFATE HFA 108 (90 BASE) MCG/ACT IN AERS: 2.0000 | INHALATION_SPRAY | RESPIRATORY_TRACT | 0 refills | Status: DC | PRN

## 2019-05-29 MED ORDER — PREDNISONE 20 MG PO TABS: ORAL_TABLET | ORAL | 0 refills | Status: DC

## 2019-05-29 NOTE — Progress Notes (Signed)
Assessment and Plan:  Cathy Howard was seen today for wheezing and sore throat.  Diagnoses and all orders for this visit:  Viral URI with cough Benign exam today; history suggestive of viral URI; discussed typically worst on day 4 with 5-7 day duration Discussed the importance of avoiding unnecessary antibiotic therapy. Suggested symptomatic OTC remedies. Nasal saline spray for congestion. Nasal steroids, allergy pill, oral steroids offered Follow up as needed if any new symptoms or not improving Go to the ER if any chest pain, shortness of breath, nausea, dizziness, severe HA, changes vision/speech -     predniSONE (DELTASONE) 20 MG tablet; 2 tablets daily for 3 days, 1 tablet daily for 4 days. -     promethazine-dextromethorphan (PROMETHAZINE-DM) 6.25-15 MG/5ML syrup; Take 5 mLs by mouth 4 (four) times daily as needed for cough. -     albuterol (VENTOLIN HFA) 108 (90 Base) MCG/ACT inhaler; Inhale 2 puffs into the lungs every 4 (four) hours as needed for wheezing or shortness of breath.   Further disposition pending results of labs. Discussed med's effects and SE's.   Over 15 minutes of exam, counseling, chart review, and critical decision making was performed.   Future Appointments  Date Time Provider Department Center  06/14/2019  9:00 AM Quentin Mulling, PA-C [id number] None    ------------------------------------------------------------------------------------------------------------------   HPI BP 110/70   Pulse 78   Temp 97.7 F (36.5 C)   Ht 4' 7.5" (1.41 m)   Wt 149 lb (67.6 kg)   SpO2 98%   BMI 34.01 kg/m   43 y.o.female with mental retardation presents accompanied by her mother due to concerns with "wheezing sounds" when she lies down at night or for a nap for 3-4 days, seems to be "breathing harder" when she lies down but denies dyspnea. She reports mild throat irritation, mild dry cough mainly when lying down, mild headache. Mother feels she has been lying around more and  sleepy compared to baseline. Denies fever/chills. Denies CP/achiness.   Has started mucinex BID with questionable benefit, has restarted with OTC certirizine without much improvement.   Had covid 19 test prior to surgery, has not been out other than to doctor's office since.   She recently underwent R hip replacement on 05/01/2019 by Dr. Thurnell Lose through [facility].     Past Medical History:  Diagnosis Date  . ADD (attention deficit disorder)   . Hyperlipidemia   . Mental retardation   . OCD (obsessive compulsive disorder)   . Unspecified vitamin D deficiency      Allergies  Allergen Reactions  . Fentanyl Other (See Comments)    O2 decreases  . Maxitrol [Neomycin-Polymyxin-Dexameth]   . Neomycin-Bacitracin Zn-Polymyx Swelling    Current Outpatient Medications on File Prior to Visit  Medication Sig  . amphetamine-dextroamphetamine (ADDERALL XR) 15 MG 24 hr capsule Take by mouth.  Marland Kitchen aspirin EC 81 MG tablet Take by mouth 2 (two) times daily.   . celecoxib (CELEBREX) 200 MG capsule Take 200 mg by mouth 2 (two) times daily.  . Cholecalciferol (VITAMIN D3) 125 MCG (5000 UT) TABS Take by mouth.  Marland Kitchen FLUoxetine (PROZAC) 20 MG capsule Take 60 mg by mouth every morning.  Marland Kitchen LORazepam (ATIVAN) 0.5 MG tablet Take 0.5 mg by mouth every 6 (six) hours as needed. Reported on 10/17/2015  . Magnesium 250 MG TABS Take by mouth daily.  . Multiple Vitamin (MULTIVITAMIN) tablet Take 1 tablet by mouth daily.  . mupirocin ointment (BACTROBAN) 2 % Place 1 application into the nose  2 (two) times daily. (Patient taking differently: Place 1 application into the nose as needed. )  . topiramate (TOPAMAX) 100 MG tablet Take 100 mg by mouth 2 (two) times daily.  . vitamin C (ASCORBIC ACID) 500 MG tablet Take 500 mg by mouth daily.  Marland Kitchen doxycycline (VIBRAMYCIN) 100 MG capsule Take 1 capsule twice daily with food  . fluticasone (FLONASE) 50 MCG/ACT nasal spray Place 2 sprays into both nostrils at bedtime. (Patient not  taking: Reported on 05/29/2019)  . meloxicam (MOBIC) 7.5 MG tablet Take 1 tablet (7.5 mg total) by mouth 2 (two) times daily as needed for pain (okay to take with tylenol, not aleve, ibuprofen). (Patient not taking: Reported on 05/29/2019)  . UNABLE TO FIND Take by mouth.   No current facility-administered medications on file prior to visit.     ROS: all negative except above.   Physical Exam:  BP 110/70   Pulse 78   Temp 97.7 F (36.5 C)   Ht 4' 7.5" (1.41 m)   Wt 149 lb (67.6 kg)   SpO2 98%   BMI 34.01 kg/m   General Appearance: Well nourished, in no apparent distress. Eyes: PERRLA, conjunctiva no swelling or erythema Sinuses: No Frontal/maxillary tenderness ENT/Mouth: Ext aud canals clear except for scant soft wax,TMs without erythema, bulging. No erythema, swelling, or exudate on post pharynx.  Tonsils not swollen or erythematous. Hearing normal.  Neck: Supple  Respiratory: Respiratory effort normal, BS without rales, fine wheezing or stridor.  Cardio: RRR with no MRGs. Brisk peripheral pulses with bilateral puffy ankles consistent with baseline.  Abdomen: Soft, + BS.  Non tender, no guarding. Lymphatics: Non tender without lymphadenopathy.  Musculoskeletal: antalgic gait Skin: Warm, dry without rashes, lesions, ecchymosis.  Neuro: Normal muscle tone  Psych: Awake and oriented x 2 (consistent with baseline), normal affect, Insight and Judgment limited    Izora Ribas, NP 3:48 PM [facility]

## 2019-05-29 NOTE — Patient Instructions (Addendum)
    Consider adding vitamin C 500 mg daily, zinc 25-40 mg daily for immune systemp  Plenty of water   Pick up albuterol inhaler ONLY IF NEEDED/NOT GETTING BETTER    HOW TO TREAT VIRAL COUGH AND COLD SYMPTOMS:  -Symptoms usually last at least 1 week with the worst symptoms being around day 4.  - colds usually start with a sore throat and end with a cough, and the cough can take 2 weeks to get better.  -No antibiotics are needed for colds, flu, sore throats, cough, bronchitis UNLESS symptoms are longer than 7 days OR if you are getting better then get drastically worse.  -There are a lot of combination medications (Dayquil, Nyquil, Vicks 44, tyelnol cold and sinus, ETC). Please look at the ingredients on the back so that you are treating the correct symptoms and not doubling up on medications/ingredients.    Medicines you can use  Nasal congestion  Little Remedies saline spray (aerosol/mist)- can try this, it is in the kids section - pseudoephedrine (Sudafed)- behind the counter, do not use if you have high blood pressure, medicine that have -D in them.  - phenylephrine (Sudafed PE) -Dextormethorphan + chlorpheniramine (Coridcidin HBP)- okay if you have high blood pressure -Oxymetazoline (Afrin) nasal spray- LIMIT to 3 days -Saline nasal spray -Neti pot (used distilled or bottled water)  Ear pain/congestion  -pseudoephedrine (sudafed) - Nasonex/flonase nasal spray  Fever  -Acetaminophen (Tyelnol) -Ibuprofen (Advil, motrin, aleve)  Sore Throat  -Acetaminophen (Tyelnol) -Ibuprofen (Advil, motrin, aleve) -Drink a lot of water -Gargle with salt water - Rest your voice (don't talk) -Throat sprays -Cough drops  Body Aches  -Acetaminophen (Tyelnol) -Ibuprofen (Advil, motrin, aleve)  Headache  -Acetaminophen (Tyelnol) -Ibuprofen (Advil, motrin, aleve) - Exedrin, Exedrin Migraine  Allergy symptoms (cough, sneeze, runny nose, itchy eyes) -Claritin or loratadine cheapest but  likely the weakest  -Zyrtec or certizine at night because it can make you sleepy -The strongest is allegra or fexafinadine  Cheapest at [employer], [employer], [employer]  Cough  -Dextromethorphan (Delsym)- medicine that has DM in it -Guafenesin (Mucinex/Robitussin) - cough drops - drink lots of water  Chest Congestion  -Guafenesin (Mucinex/Robitussin)  Red Itchy Eyes  - Naphcon-A  Upset Stomach  - Bland diet (nothing spicy, greasy, fried, and high acid foods like tomatoes, oranges, berries) -OKAY- cereal, bread, soup, crackers, rice -Eat smaller more frequent meals -reduce caffeine, no alcohol -Loperamide (Imodium-AD) if diarrhea -Prevacid for heart burn  General health when sick  -Hydration -wash your hands frequently -keep surfaces clean -change pillow cases and sheets often -Get fresh air but do not exercise strenuously -Vitamin D, double up on it - Vitamin C -Zinc

## 2019-05-31 DIAGNOSIS — M25551 Pain in right hip: Secondary | ICD-10-CM | POA: Diagnosis not present

## 2019-05-31 DIAGNOSIS — M1611 Unilateral primary osteoarthritis, right hip: Secondary | ICD-10-CM | POA: Diagnosis not present

## 2019-05-31 DIAGNOSIS — Z7409 Other reduced mobility: Secondary | ICD-10-CM | POA: Diagnosis not present

## 2019-05-31 DIAGNOSIS — Q6589 Other specified congenital deformities of hip: Secondary | ICD-10-CM | POA: Diagnosis not present

## 2019-06-02 DIAGNOSIS — M1611 Unilateral primary osteoarthritis, right hip: Secondary | ICD-10-CM | POA: Diagnosis not present

## 2019-06-02 DIAGNOSIS — Q6589 Other specified congenital deformities of hip: Secondary | ICD-10-CM | POA: Diagnosis not present

## 2019-06-02 DIAGNOSIS — M25551 Pain in right hip: Secondary | ICD-10-CM | POA: Diagnosis not present

## 2019-06-02 DIAGNOSIS — Z7409 Other reduced mobility: Secondary | ICD-10-CM | POA: Diagnosis not present

## 2019-06-09 DIAGNOSIS — Q6589 Other specified congenital deformities of hip: Secondary | ICD-10-CM | POA: Diagnosis not present

## 2019-06-09 DIAGNOSIS — M25551 Pain in right hip: Secondary | ICD-10-CM | POA: Diagnosis not present

## 2019-06-09 DIAGNOSIS — M1611 Unilateral primary osteoarthritis, right hip: Secondary | ICD-10-CM | POA: Diagnosis not present

## 2019-06-09 DIAGNOSIS — Z7409 Other reduced mobility: Secondary | ICD-10-CM | POA: Diagnosis not present

## 2019-06-12 DIAGNOSIS — M1611 Unilateral primary osteoarthritis, right hip: Secondary | ICD-10-CM | POA: Diagnosis not present

## 2019-06-12 DIAGNOSIS — Z7409 Other reduced mobility: Secondary | ICD-10-CM | POA: Diagnosis not present

## 2019-06-12 DIAGNOSIS — M25551 Pain in right hip: Secondary | ICD-10-CM | POA: Diagnosis not present

## 2019-06-12 DIAGNOSIS — Q6589 Other specified congenital deformities of hip: Secondary | ICD-10-CM | POA: Diagnosis not present

## 2019-06-13 DIAGNOSIS — M25561 Pain in right knee: Secondary | ICD-10-CM | POA: Diagnosis not present

## 2019-06-13 DIAGNOSIS — Z96641 Presence of right artificial hip joint: Secondary | ICD-10-CM | POA: Diagnosis not present

## 2019-06-13 DIAGNOSIS — M25562 Pain in left knee: Secondary | ICD-10-CM | POA: Diagnosis not present

## 2019-06-13 DIAGNOSIS — Z471 Aftercare following joint replacement surgery: Secondary | ICD-10-CM | POA: Diagnosis not present

## 2019-06-13 DIAGNOSIS — M8588 Other specified disorders of bone density and structure, other site: Secondary | ICD-10-CM | POA: Diagnosis not present

## 2019-06-13 NOTE — Progress Notes (Signed)
Complete Physical  Assessment and Plan:  Encounter for general adult medical examination with abnormal findings 1 year  Intellectual disability Continue follow up with psych, mom is here with patient  Developmental delay Continue follow up with psych, mom is here with patient  Vitamin D deficiency -     Vitamin D (25 hydroxy)  Hyperlipidemia, unspecified hyperlipidemia type -     Lipid Profile check lipids decrease fatty foods increase activity.   Attention deficit disorder, unspecified hyperactivity presence -  Continue ADD medication, helps with focus, no AE's. The patient was counseled on the addictive nature of the medication and was encouraged to take drug holidays when not needed.   Obesity (BMI 30.0-34.9) - follow up 3 months for progress monitoring - increase veggies, decrease carbs - long discussion about weight loss, diet, and exercise  Anxiety -     TSH Continue follow up psych  Arthritis, lumbar spine Continue follow up ortho  Hip dysplasia, congenital S/p THR right  Gastroesophageal reflux disease without esophagitis Continue PPI/H2 blocker, diet discussed  Abnormal glucose -     Hemoglobin A1c ([facility])  Medication management -     CBC with Diff -     COMPLETE METABOLIC PANEL WITH GFR -     Magnesium  Screening for hematuria or proteinuria -     Cancel: Urinalysis, Routine w reflex microscopic -     Cancel: Microalbumin / Creatinine Urine Ratio -     Urinalysis, Routine w reflex microscopic -     Microalbumin / Creatinine Urine Ratio  Screening, anemia, deficiency, iron -     Iron,Total/Total Iron Binding Cap -     Vitamin B12   Discussed med's effects and SE's. Screening labs and tests as requested with regular follow-up as recommended. Future Appointments  Date Time Provider Department Center  07/10/2020  9:00 AM Quentin Mullingollier, Marishka Rentfrow, PA-C [facility] None    HPI  43 y.o. female  presents for a complete physical.  Patient is 6 weeks  s/p right hip replacement at [facility] with Dr. Thurnell LosePlate on 78/29/562110/27/2020. She also had a fusion of spin thoracolumbar region 02/27/2019. Has some bursitis in that hip, has finished the celebrex.   Her blood pressure has been controlled at home, today their BP is BP: 110/82.  She does not workout. She denies chest pain, shortness of breath, dizziness.  She has MR, mom is here with her, she picks her skin. She follows with psych for behavioral issues from her MR such as inattention, anxiety, et.  She is on cholesterol medication and denies myalgias. Her cholesterol is not at goal. The cholesterol last visit was:  Lab Results  Component Value Date   CHOL 189 12/08/2018   HDL 65 12/08/2018   LDLCALC 110 (H) 12/08/2018   TRIG 49 12/08/2018   CHOLHDL 2.9 12/08/2018  . Her A1c has been controlled with diet.  Her mother reports that she does eat cake icing and also eating mayonaise sandwiches.  She reports  Last A1C in the office was:  Lab Results  Component Value Date   HGBA1C 5.4 12/08/2018   Patient is on Vitamin D supplement, started 2 weeks ago 5000 IU a day.   Lab Results  Component Value Date   VD25OH 30 12/08/2018     BMI is Body mass index is 32.73 kg/m., she is working on diet and exercise. Wt Readings from Last 3 Encounters:  06/14/19 143 lb 6.4 oz (65 kg)  05/29/19 140 lb (63.5 kg)  12/22/18  139 lb (63 kg)     Current Medications:  Current Outpatient Medications on File Prior to Visit  Medication Sig Dispense Refill  . amphetamine-dextroamphetamine (ADDERALL XR) 15 MG 24 hr capsule Take by mouth.    Marland Kitchen aspirin EC 81 MG tablet Take by mouth 2 (two) times daily.     . Cholecalciferol (VITAMIN D3) 125 MCG (5000 UT) TABS Take by mouth.    Marland Kitchen FLUoxetine (PROZAC) 20 MG capsule Take 60 mg by mouth every morning.  5  . Magnesium 250 MG TABS Take by mouth daily.    . meloxicam (MOBIC) 7.5 MG tablet Take 1 tablet (7.5 mg total) by mouth 2 (two) times daily as needed for pain (okay to take with  tylenol, not aleve, ibuprofen). 180 tablet 1  . Multiple Vitamin (MULTIVITAMIN) tablet Take 1 tablet by mouth daily.    . mupirocin ointment (BACTROBAN) 2 % Place 1 application into the nose 2 (two) times daily. (Patient taking differently: Place 1 application into the nose as needed. ) 22 g 0  . topiramate (TOPAMAX) 100 MG tablet Take 100 mg by mouth 2 (two) times daily.    Marland Kitchen UNABLE TO FIND Take by mouth.    . vitamin C (ASCORBIC ACID) 500 MG tablet Take 500 mg by mouth daily.     No current facility-administered medications on file prior to visit.     Health Maintenance:   Immunization History  Administered Date(s) Administered  . Influenza Inj Mdck Quad With Preservative 06/03/2017, 06/06/2018  . Influenza Split 04/15/2015  . Influenza,inj,Quad PF,6+ Mos 04/11/2019  . Influenza,inj,quad, With Preservative 04/14/2016  . Influenza-Unspecified 05/24/2014  . PPD Test 02/19/2014  . Tdap 02/07/2013   Tetanus: 2014 Flu: 2020 Pap: Not indicated as patient is not sexually active  Patient Care Team: Unk Pinto, MD as PCP - General (Internal Medicine) Newton Pigg, MD as Consulting Physician (Obstetrics and Gynecology) Shawnie Dapper, DO (Inactive) (Optometry) Regal, Tamala Fothergill, DPM as Consulting Physician (Podiatry)  Medical History:  Past Medical History:  Diagnosis Date  . ADD (attention deficit disorder)   . Hyperlipidemia   . Mental retardation   . OCD (obsessive compulsive disorder)   . Unspecified vitamin D deficiency    Allergies Allergies  Allergen Reactions  . Fentanyl Other (See Comments)    O2 decreases  . Maxitrol [Neomycin-Polymyxin-Dexameth]   . Neomycin-Bacitracin Zn-Polymyx Swelling    SURGICAL HISTORY She  has a past surgical history that includes Hip surgery; scoliosis surgery; Eye surgery; and Hip Arthroplasty (Right, 05/01/2019). FAMILY HISTORY Her family history includes Cancer in her mother; Diabetes in her maternal grandmother; Heart disease  in her maternal grandmother and mother; Hyperlipidemia in her mother; Stroke in her maternal grandmother. SOCIAL HISTORY She  reports that she has never smoked. She has never used smokeless tobacco. She reports that she does not drink alcohol or use drugs.  Review of Systems: Review of Systems  Constitutional: Negative for chills, fever and malaise/fatigue.  HENT: Negative for congestion, ear pain and sore throat.   Eyes: Negative.   Respiratory: Negative for cough, shortness of breath and wheezing.   Cardiovascular: Negative for chest pain, palpitations and leg swelling.  Gastrointestinal: Negative for abdominal pain, blood in stool, constipation, diarrhea, heartburn and melena.  Genitourinary: Negative.   Skin: Negative.   Neurological: Negative for dizziness, sensory change, loss of consciousness and headaches.  Psychiatric/Behavioral: Negative for depression. The patient is not nervous/anxious and does not have insomnia.     Physical Exam:  Estimated body mass index is 32.73 kg/m as calculated from the following:   Height as of this encounter: 4' 7.5" (1.41 m).   Weight as of this encounter: 143 lb 6.4 oz (65 kg). BP 110/82   Pulse 74   Temp 98.6 F (37 C)   Ht 4' 7.5" (1.41 m)   Wt 143 lb 6.4 oz (65 kg)   SpO2 96%   BMI 32.73 kg/m   General Appearance: Well nourished well developed, in no apparent distress.  Eyes: PERRLA, EOMs, conjunctiva no swelling or erythema, eyes wide set ENT/Mouth: Ear canals normal without obstruction, swelling, erythema, or discharge.  TMs normal bilaterally with no erythema, bulging, retraction, or loss of landmark.  Oropharynx moist and clear with no exudate, erythema, or swelling.   Neck: Supple, thyroid normal. No bruits.  No cervical adenopathy Respiratory: Respiratory effort normal, Breath sounds clear A&P without wheeze, rhonchi, rales.   Cardio: RRR with 2+ murmur heard best on LSB, no rubs or gallops. Brisk peripheral pulses with 1+ edema.  No warmth, no redness, no hard cord or discomfort.   Chest: symmetric, with normal excursions Abdomen: Soft, nontender, no guarding, rebound, hernias, masses, or organomegaly.  Lymphatics: Non tender without lymphadenopathy.  Musculoskeletal: Full ROM all peripheral extremities,5/5 strength, and normal gait. Scoliosis to the right present on examination.  No kyphosis Skin: Warm, dry without rashes, lesions, ecchymosis. Neuro: Awake and oriented X 3, Cranial nerves intact, reflexes equal bilaterally. Normal muscle tone, no cerebellar symptoms. Sensation intact.  Psych:  normal affect, Insight and Judgment appropriate for patient.   Over 40 minutes of exam, counseling, chart review and critical decision making was performed  Quentin Mulling 9:21 AM [facility]

## 2019-06-14 ENCOUNTER — Other Ambulatory Visit: Payer: Self-pay

## 2019-06-14 ENCOUNTER — Ambulatory Visit (INDEPENDENT_AMBULATORY_CARE_PROVIDER_SITE_OTHER): Payer: BC Managed Care – PPO | Admitting: Physician Assistant

## 2019-06-14 ENCOUNTER — Encounter: Payer: Self-pay | Admitting: Physician Assistant

## 2019-06-14 VITALS — BP 110/82 | HR 74 | Temp 98.6°F | Ht <= 58 in | Wt 143.4 lb

## 2019-06-14 DIAGNOSIS — R6250 Unspecified lack of expected normal physiological development in childhood: Secondary | ICD-10-CM

## 2019-06-14 DIAGNOSIS — Z0000 Encounter for general adult medical examination without abnormal findings: Secondary | ICD-10-CM

## 2019-06-14 DIAGNOSIS — Z130 Encounter for screening for diseases of the blood and blood-forming organs and certain disorders involving the immune mechanism: Secondary | ICD-10-CM | POA: Diagnosis not present

## 2019-06-14 DIAGNOSIS — M47816 Spondylosis without myelopathy or radiculopathy, lumbar region: Secondary | ICD-10-CM

## 2019-06-14 DIAGNOSIS — Z1329 Encounter for screening for other suspected endocrine disorder: Secondary | ICD-10-CM | POA: Diagnosis not present

## 2019-06-14 DIAGNOSIS — F79 Unspecified intellectual disabilities: Secondary | ICD-10-CM

## 2019-06-14 DIAGNOSIS — Z1389 Encounter for screening for other disorder: Secondary | ICD-10-CM | POA: Diagnosis not present

## 2019-06-14 DIAGNOSIS — E785 Hyperlipidemia, unspecified: Secondary | ICD-10-CM

## 2019-06-14 DIAGNOSIS — Z131 Encounter for screening for diabetes mellitus: Secondary | ICD-10-CM

## 2019-06-14 DIAGNOSIS — E559 Vitamin D deficiency, unspecified: Secondary | ICD-10-CM

## 2019-06-14 DIAGNOSIS — F988 Other specified behavioral and emotional disorders with onset usually occurring in childhood and adolescence: Secondary | ICD-10-CM

## 2019-06-14 DIAGNOSIS — E669 Obesity, unspecified: Secondary | ICD-10-CM

## 2019-06-14 DIAGNOSIS — Q6589 Other specified congenital deformities of hip: Secondary | ICD-10-CM

## 2019-06-14 DIAGNOSIS — Z79899 Other long term (current) drug therapy: Secondary | ICD-10-CM

## 2019-06-14 DIAGNOSIS — F419 Anxiety disorder, unspecified: Secondary | ICD-10-CM

## 2019-06-14 DIAGNOSIS — Z13220 Encounter for screening for lipoid disorders: Secondary | ICD-10-CM | POA: Diagnosis not present

## 2019-06-14 DIAGNOSIS — R7309 Other abnormal glucose: Secondary | ICD-10-CM

## 2019-06-14 DIAGNOSIS — K219 Gastro-esophageal reflux disease without esophagitis: Secondary | ICD-10-CM

## 2019-06-14 DIAGNOSIS — Z0001 Encounter for general adult medical examination with abnormal findings: Secondary | ICD-10-CM

## 2019-06-14 NOTE — Patient Instructions (Signed)
GENERAL HEALTH GOALS  Know what a healthy weight is for you (roughly BMI <25) and aim to maintain this  Aim for 7+ servings of fruits and vegetables daily  70-80+ fluid ounces of water or unsweet tea for healthy kidneys  Limit to max 1 drink of alcohol per day; avoid smoking/tobacco  Limit animal fats in diet for cholesterol and heart health - choose grass fed whenever available  Avoid highly processed foods, and foods high in saturated/trans fats  Aim for low stress - take time to unwind and care for your mental health  Aim for 150 min of moderate intensity exercise weekly for heart health, and weights twice weekly for bone health  Aim for 7-9 hours of sleep daily  VITAMIN D IS IMPORTANT  Vitamin D goal is between 60-80  Please make sure that you are taking your Vitamin D as directed.   It is very important as a natural anti-inflammatory   helping hair, skin, and nails, as well as reducing stroke and heart attack risk.   It helps your bones and helps with mood.  It also decreases numerous cancer risks so please take it as directed.   Low Vit D is associated with a 200-300% higher risk for CANCER   and 200-300% higher risk for HEART   ATTACK  &  STROKE.    .....................................Marland Kitchen  It is also associated with higher death rate at younger ages,   autoimmune diseases like Rheumatoid arthritis, Lupus, Multiple Sclerosis.     Also many other serious conditions, like depression, Alzheimer's  Dementia, infertility, muscle aches, fatigue, fibromyalgia - just to name a few.  +++++++++++++++++++  Can get liquid vitamin D from [place] here in [place] at  [facility] alternatives [address], [place], [place] [address] Or you can try earth fare

## 2019-06-15 LAB — VITAMIN B12: Vitamin B-12: 942 pg/mL (ref 200–1100)

## 2019-06-15 LAB — LIPID PANEL
Cholesterol: 200 mg/dL — ABNORMAL HIGH (ref <200)
HDL: 75 mg/dL (ref >=50)
LDL Cholesterol (Calc): 108 mg/dL (calc) — ABNORMAL HIGH
Non-HDL Cholesterol (Calc): 125 mg/dL (calc) (ref <130)
Total CHOL/HDL Ratio: 2.7 (calc) (ref <5.0)
Triglycerides: 81 mg/dL (ref <150)

## 2019-06-15 LAB — COMPLETE METABOLIC PANEL WITH GFR
AG Ratio: 1.8 (calc) (ref 1.0–2.5)
ALT: 25 U/L (ref 6–29)
AST: 29 U/L (ref 10–30)
Albumin: 4.6 g/dL (ref 3.6–5.1)
Alkaline phosphatase (APISO): 102 U/L (ref 31–125)
BUN: 16 mg/dL (ref 7–25)
CO2: 26 mmol/L (ref 20–32)
Calcium: 10.1 mg/dL (ref 8.6–10.2)
Chloride: 103 mmol/L (ref 98–110)
Creat: 0.58 mg/dL (ref 0.50–1.10)
GFR, Est African American: 133 mL/min/{1.73_m2} (ref >=60)
GFR, Est Non African American: 114 mL/min/{1.73_m2} (ref >=60)
Globulin: 2.5 g/dL (calc) (ref 1.9–3.7)
Glucose, Bld: 77 mg/dL (ref 65–99)
Potassium: 4.6 mmol/L (ref 3.5–5.3)
Sodium: 139 mmol/L (ref 135–146)
Total Bilirubin: 0.4 mg/dL (ref 0.2–1.2)
Total Protein: 7.1 g/dL (ref 6.1–8.1)

## 2019-06-15 LAB — MICROALBUMIN / CREATININE URINE RATIO
Creatinine, Urine: 39 mg/dL (ref 20–275)
Microalb Creat Ratio: 10 mcg/mg creat (ref <30)
Microalb, Ur: 0.4 mg/dL

## 2019-06-15 LAB — URINALYSIS, ROUTINE W REFLEX MICROSCOPIC
Bilirubin Urine: NEGATIVE
Glucose, UA: NEGATIVE
Hgb urine dipstick: NEGATIVE
Ketones, ur: NEGATIVE
Leukocytes,Ua: NEGATIVE
Nitrite: NEGATIVE
Protein, ur: NEGATIVE
Specific Gravity, Urine: 1.012 (ref 1.001–1.03)
pH: 7 (ref 5.0–8.0)

## 2019-06-15 LAB — CBC WITH DIFFERENTIAL/PLATELET
Absolute Monocytes: 432 cells/uL (ref 200–950)
Basophils Absolute: 38 cells/uL (ref 0–200)
Basophils Relative: 0.8 %
Eosinophils Absolute: 221 cells/uL (ref 15–500)
Eosinophils Relative: 4.6 %
HCT: 37.1 % (ref 35.0–45.0)
Hemoglobin: 11.7 g/dL (ref 11.7–15.5)
Lymphs Abs: 1402 cells/uL (ref 850–3900)
MCH: 29.5 pg (ref 27.0–33.0)
MCHC: 31.5 g/dL — ABNORMAL LOW (ref 32.0–36.0)
MCV: 93.5 fL (ref 80.0–100.0)
MPV: 9.3 fL (ref 7.5–12.5)
Monocytes Relative: 9 %
Neutro Abs: 2707 cells/uL (ref 1500–7800)
Neutrophils Relative %: 56.4 %
Platelets: 387 10*3/uL (ref 140–400)
RBC: 3.97 10*6/uL (ref 3.80–5.10)
RDW: 11.7 % (ref 11.0–15.0)
Total Lymphocyte: 29.2 %
WBC: 4.8 10*3/uL (ref 3.8–10.8)

## 2019-06-15 LAB — HEMOGLOBIN A1C
Hgb A1c MFr Bld: 5.1 % of total Hgb (ref <5.7)
Mean Plasma Glucose: 100 (calc)
eAG (mmol/L): 5.5 (calc)

## 2019-06-15 LAB — TSH: TSH: 1.18 mIU/L

## 2019-06-15 LAB — IRON, TOTAL/TOTAL IRON BINDING CAP
%SAT: 11 % (calc) — ABNORMAL LOW (ref 16–45)
Iron: 41 ug/dL (ref 40–190)
TIBC: 383 mcg/dL (calc) (ref 250–450)

## 2019-06-15 LAB — VITAMIN D 25 HYDROXY (VIT D DEFICIENCY, FRACTURES): Vit D, 25-Hydroxy: 31 ng/mL (ref 30–100)

## 2019-06-15 LAB — MAGNESIUM: Magnesium: 1.9 mg/dL (ref 1.5–2.5)

## 2019-06-19 DIAGNOSIS — M1611 Unilateral primary osteoarthritis, right hip: Secondary | ICD-10-CM | POA: Diagnosis not present

## 2019-06-19 DIAGNOSIS — Z9181 History of falling: Secondary | ICD-10-CM | POA: Insufficient documentation

## 2019-06-19 DIAGNOSIS — Z79899 Other long term (current) drug therapy: Secondary | ICD-10-CM | POA: Insufficient documentation

## 2019-06-19 DIAGNOSIS — Q6589 Other specified congenital deformities of hip: Secondary | ICD-10-CM | POA: Diagnosis not present

## 2019-06-19 DIAGNOSIS — M8589 Other specified disorders of bone density and structure, multiple sites: Secondary | ICD-10-CM | POA: Diagnosis not present

## 2019-06-19 DIAGNOSIS — M25551 Pain in right hip: Secondary | ICD-10-CM | POA: Diagnosis not present

## 2019-06-19 DIAGNOSIS — Z7409 Other reduced mobility: Secondary | ICD-10-CM | POA: Diagnosis not present

## 2019-06-19 DIAGNOSIS — M810 Age-related osteoporosis without current pathological fracture: Secondary | ICD-10-CM | POA: Diagnosis not present

## 2019-06-19 DIAGNOSIS — E28319 Asymptomatic premature menopause: Secondary | ICD-10-CM | POA: Insufficient documentation

## 2019-07-03 DIAGNOSIS — M810 Age-related osteoporosis without current pathological fracture: Secondary | ICD-10-CM | POA: Diagnosis not present

## 2019-07-03 DIAGNOSIS — M8589 Other specified disorders of bone density and structure, multiple sites: Secondary | ICD-10-CM | POA: Diagnosis not present

## 2019-07-03 DIAGNOSIS — Z780 Asymptomatic menopausal state: Secondary | ICD-10-CM | POA: Diagnosis not present

## 2019-07-03 DIAGNOSIS — Q6589 Other specified congenital deformities of hip: Secondary | ICD-10-CM | POA: Diagnosis not present

## 2019-07-05 DIAGNOSIS — H524 Presbyopia: Secondary | ICD-10-CM | POA: Diagnosis not present

## 2019-07-05 DIAGNOSIS — H52221 Regular astigmatism, right eye: Secondary | ICD-10-CM | POA: Diagnosis not present

## 2019-07-05 DIAGNOSIS — H5211 Myopia, right eye: Secondary | ICD-10-CM | POA: Diagnosis not present

## 2019-09-04 NOTE — Progress Notes (Signed)
FOLLOW UP  Assessment and Plan:   Cholesterol Currently borderline elevations without medication Continue low cholesterol diet and exercise.  Check lipid panel.   Other abnormal glucose Recent A1Cs at goal Discussed diet/exercise, weight management  Defer A1C; check BMP  Obesity with co morbidities Long discussion about weight loss, diet, and exercise Recommended diet heavy in fruits and veggies and low in animal meats, cheeses, and dairy products, appropriate calorie intake Discussed ideal weight for height  Will follow up in 3 months  Vitamin D Def Below goal at last visit; she has started on supplementation since last check Check Vit D level  ADD Continue medications Helps with focus, no AE's. The patient was counseled on the addictive nature of the medication and was encouraged to take drug holidays when not needed.   Anxiety Well managed by current regimen; continue medications Stress management techniques discussed, increase water, good sleep hygiene discussed, increase exercise, and increase veggies.    Continue diet and meds as discussed. Further disposition pending results of labs. Discussed med's effects and SE's.   Over 30 minutes of exam, counseling, chart review, and critical decision making was performed.   Future Appointments  Date Time Provider Department Center  07/10/2020  9:00 AM Quentin Mulling, PA-C [facility] None    ----------------------------------------------------------------------------------------------------------------------  HPI 44 y.o. female with intellectual disability presents accompanied by her mother for 3 month follow up on cholesterol, obesity, GERD, ADD, anxiety and vitamin D deficiency.   She had a fall at the [employer], tripped on a pole and hit under her left eye, saw the eye doctor and doing better.   She is managed by psych NP Brock Bad.  She is on topamax for anxiety.   Patient is on an ADD medication, she  states that the medication is helping and she denies any adverse reactions. Takes 15 mg adderall once daily in the mornings.   she has a diagnosis of anxiety and is currently on prozac 20 mg daily and has lorazepam but NEVER takes it reports symptoms are well controlled on current regimen.     BMI is Body mass index is 32.64 kg/m., she has not been working on diet but does exercise - taking walks with mom.  Wt Readings from Last 3 Encounters:  09/06/19 143 lb (64.9 kg)  06/14/19 143 lb 6.4 oz (65 kg)  05/29/19 140 lb (63.5 kg)   Today their BP is BP: 120/80  She does workout. She denies chest pain, shortness of breath, dizziness.   She is not on cholesterol medication and denies myalgias. Her cholesterol is not at goal. The cholesterol last visit was:   Lab Results  Component Value Date   CHOL 200 (H) 06/14/2019   HDL 75 06/14/2019   LDLCALC 108 (H) 06/14/2019   TRIG 81 06/14/2019   CHOLHDL 2.7 06/14/2019    She has been working on exercise for glucose management, and denies foot ulcerations, increased appetite, nausea, paresthesia of the feet, polydipsia, polyuria, visual disturbances, vomiting and weight loss. Last A1C in the office was:  Lab Results  Component Value Date   HGBA1C 5.1 06/14/2019   Patient is on Vitamin D supplement intermittently:   Lab Results  Component Value Date   VD25OH 31 06/14/2019     Had first reclast injection this past week. Had right hip replacement and saw surgeon in Jan that was good.    Current Medications:  Current Outpatient Medications on File Prior to Visit  Medication Sig  .  amphetamine-dextroamphetamine (ADDERALL XR) 15 MG 24 hr capsule Take by mouth.  Marland Kitchen aspirin EC 81 MG tablet Take by mouth 2 (two) times daily.   . Cholecalciferol (VITAMIN D3) 125 MCG (5000 UT) TABS Take by mouth.  Marland Kitchen FLUoxetine (PROZAC) 20 MG capsule Take 60 mg by mouth every morning.  . Magnesium 250 MG TABS Take by mouth daily.  . meloxicam (MOBIC) 7.5 MG tablet  Take 1 tablet (7.5 mg total) by mouth 2 (two) times daily as needed for pain (okay to take with tylenol, not aleve, ibuprofen).  . Multiple Vitamin (MULTIVITAMIN) tablet Take 1 tablet by mouth daily.  Marland Kitchen topiramate (TOPAMAX) 100 MG tablet Take 100 mg by mouth 2 (two) times daily.  Marland Kitchen UNABLE TO FIND Take by mouth.  . vitamin C (ASCORBIC ACID) 500 MG tablet Take 500 mg by mouth daily.   No current facility-administered medications on file prior to visit.     Allergies:  Allergies  Allergen Reactions  . Fentanyl Other (See Comments)    O2 decreases  . Maxitrol [Neomycin-Polymyxin-Dexameth]   . Neomycin-Bacitracin Zn-Polymyx Swelling     Medical History:  Past Medical History:  Diagnosis Date  . ADD (attention deficit disorder)   . Hyperlipidemia   . Mental retardation   . OCD (obsessive compulsive disorder)   . Unspecified vitamin D deficiency    Family history- Reviewed and unchanged Social history- Reviewed and unchanged   Review of Systems:  Review of Systems  Constitutional: Negative for malaise/fatigue and weight loss.  HENT: Negative for hearing loss and tinnitus.   Eyes: Negative for blurred vision and double vision.  Respiratory: Negative for cough, shortness of breath and wheezing.   Cardiovascular: Negative for chest pain, palpitations, orthopnea, claudication, leg swelling and PND.  Gastrointestinal: Negative for abdominal pain, blood in stool, constipation, diarrhea, heartburn, melena, nausea and vomiting.  Genitourinary: Negative.   Musculoskeletal: Positive for falls. Negative for back pain, joint pain and myalgias.  Skin: Negative for rash.  Neurological: Negative for dizziness, tingling, sensory change, weakness and headaches.  Endo/Heme/Allergies: Negative for polydipsia.  Psychiatric/Behavioral: Negative.   All other systems reviewed and are negative.   Physical Exam: BP 120/80   Pulse 60   Temp (!) 97.3 F (36.3 C)   Wt 143 lb (64.9 kg)   SpO2 97%    BMI 32.64 kg/m  Wt Readings from Last 3 Encounters:  09/06/19 143 lb (64.9 kg)  06/14/19 143 lb 6.4 oz (65 kg)  05/29/19 140 lb (63.5 kg)   General Appearance: Well nourished, in no apparent distress. Eyes: PERRLA, EOMs, conjunctiva no swelling or erythema, ecchymosis and bruising under left eye Sinuses: No Frontal/maxillary tenderness ENT/Mouth: Ext aud canals clear, TMs without erythema, bulging.Mouth and nose not examined- patient wearing a facemask Neck: Supple, thyroid normal.  Respiratory: Respiratory effort normal, BS equal bilaterally without rales, rhonchi, wheezing or stridor.  Cardio: RRR with no MRGs. Brisk peripheral pulses without edema.  Abdomen: Soft, + BS.  Non tender, no guarding, rebound, hernias, masses. Lymphatics: Non tender without lymphadenopathy.  Musculoskeletal: Full ROM, 5/5 strength, waddling gait Skin: Warm, dry without rashes, lesions, ecchymosis.  Neuro: Cranial nerves intact. No cerebellar symptoms.  Psych: Awake and oriented X 2, normal affect, Insight and Judgment poor.    Vicie Mutters, PA-C 9:05 AM [facility]

## 2019-09-06 ENCOUNTER — Ambulatory Visit (INDEPENDENT_AMBULATORY_CARE_PROVIDER_SITE_OTHER): Payer: Managed Care, Other (non HMO) | Admitting: Physician Assistant

## 2019-09-06 ENCOUNTER — Encounter: Payer: Self-pay | Admitting: Physician Assistant

## 2019-09-06 ENCOUNTER — Other Ambulatory Visit: Payer: Self-pay

## 2019-09-06 VITALS — BP 120/80 | HR 60 | Temp 97.3°F | Wt 143.0 lb

## 2019-09-06 DIAGNOSIS — F79 Unspecified intellectual disabilities: Secondary | ICD-10-CM

## 2019-09-06 DIAGNOSIS — R6250 Unspecified lack of expected normal physiological development in childhood: Secondary | ICD-10-CM

## 2019-09-06 DIAGNOSIS — H9203 Otalgia, bilateral: Secondary | ICD-10-CM

## 2019-09-06 DIAGNOSIS — D649 Anemia, unspecified: Secondary | ICD-10-CM

## 2019-09-06 DIAGNOSIS — E559 Vitamin D deficiency, unspecified: Secondary | ICD-10-CM

## 2019-09-06 DIAGNOSIS — Z79899 Other long term (current) drug therapy: Secondary | ICD-10-CM

## 2019-09-06 DIAGNOSIS — H6123 Impacted cerumen, bilateral: Secondary | ICD-10-CM

## 2019-09-06 DIAGNOSIS — E669 Obesity, unspecified: Secondary | ICD-10-CM

## 2019-09-06 DIAGNOSIS — E785 Hyperlipidemia, unspecified: Secondary | ICD-10-CM | POA: Diagnosis not present

## 2019-09-06 NOTE — Patient Instructions (Addendum)
Use a dropper or use a cap to put peroxide, olive oil,mineral oil or canola oil in the effected ear- 2-3 times a week. Let it soak for 20-30 min then you can take a shower or use a baby bulb with warm water to wash out the ear wax.  Can buy debrox wax removal kit over the counter.  Do not use Qtips  WATER IS IMPORTANT  Being dehydrated can hurt your kidneys, cause fatigue, headaches, muscle aches, joint pain, and dry skin/nails so please increase your fluids.   Drink 80-100 oz a day of water, measure it out! Eat 3 meals a day, have to do breakfast, eat protein- hard boiled eggs, protein bar like nature valley protein bar, greek yogurt like oikos triple zero, chobani 100, or light n fit greek  Can check out plantnanny app on your phone to help you keep track of your water   Know what a healthy weight is for you (roughly BMI <25) and aim to maintain this  Aim for 7+ servings of fruits and vegetables daily  65-80+ fluid ounces of water or unsweet tea for healthy kidneys  Limit to max 1 drink of alcohol per day; avoid smoking/tobacco  Limit animal fats in diet for cholesterol and heart health - choose grass fed whenever available  Avoid highly processed foods, and foods high in saturated/trans fats  Aim for low stress - take time to unwind and care for your mental health  Aim for 150 min of moderate intensity exercise weekly for heart health, and weights twice weekly for bone health  Aim for 7-9 hours of sleep daily

## 2019-09-09 LAB — COMPLETE METABOLIC PANEL WITH GFR
AG Ratio: 1.8 (calc) (ref 1.0–2.5)
ALT: 10 U/L (ref 6–29)
AST: 18 U/L (ref 10–30)
Albumin: 4.4 g/dL (ref 3.6–5.1)
Alkaline phosphatase (APISO): 93 U/L (ref 31–125)
BUN: 18 mg/dL (ref 7–25)
CO2: 28 mmol/L (ref 20–32)
Calcium: 9.4 mg/dL (ref 8.6–10.2)
Chloride: 101 mmol/L (ref 98–110)
Creat: 0.53 mg/dL (ref 0.50–1.10)
GFR, Est African American: 137 mL/min/{1.73_m2} (ref >=60)
GFR, Est Non African American: 118 mL/min/{1.73_m2} (ref >=60)
Globulin: 2.5 g/dL (calc) (ref 1.9–3.7)
Glucose, Bld: 76 mg/dL (ref 65–99)
Potassium: 4.2 mmol/L (ref 3.5–5.3)
Sodium: 138 mmol/L (ref 135–146)
Total Bilirubin: 0.4 mg/dL (ref 0.2–1.2)
Total Protein: 6.9 g/dL (ref 6.1–8.1)

## 2019-09-09 LAB — LIPID PANEL
Cholesterol: 186 mg/dL (ref <200)
HDL: 73 mg/dL (ref >=50)
LDL Cholesterol (Calc): 100 mg/dL (calc) — ABNORMAL HIGH
Non-HDL Cholesterol (Calc): 113 mg/dL (calc) (ref <130)
Total CHOL/HDL Ratio: 2.5 (calc) (ref <5.0)
Triglycerides: 45 mg/dL (ref <150)

## 2019-09-09 LAB — CBC WITH DIFFERENTIAL/PLATELET
Absolute Monocytes: 586 cells/uL (ref 200–950)
Basophils Absolute: 18 cells/uL (ref 0–200)
Basophils Relative: 0.3 %
Eosinophils Absolute: 189 cells/uL (ref 15–500)
Eosinophils Relative: 3.1 %
HCT: 39.5 % (ref 35.0–45.0)
Hemoglobin: 12.8 g/dL (ref 11.7–15.5)
Lymphs Abs: 1452 cells/uL (ref 850–3900)
MCH: 29.4 pg (ref 27.0–33.0)
MCHC: 32.4 g/dL (ref 32.0–36.0)
MCV: 90.8 fL (ref 80.0–100.0)
MPV: 9.4 fL (ref 7.5–12.5)
Monocytes Relative: 9.6 %
Neutro Abs: 3855 cells/uL (ref 1500–7800)
Neutrophils Relative %: 63.2 %
Platelets: 343 10*3/uL (ref 140–400)
RBC: 4.35 10*6/uL (ref 3.80–5.10)
RDW: 12.9 % (ref 11.0–15.0)
Total Lymphocyte: 23.8 %
WBC: 6.1 10*3/uL (ref 3.8–10.8)

## 2019-09-09 LAB — TSH: TSH: 1.02 mIU/L

## 2019-09-09 LAB — TEST AUTHORIZATION

## 2019-09-09 LAB — IRON,TIBC AND FERRITIN PANEL
%SAT: 27 % (calc) (ref 16–45)
Ferritin: 51 ng/mL (ref 16–232)
Iron: 90 ug/dL (ref 40–190)
TIBC: 333 mcg/dL (calc) (ref 250–450)

## 2019-09-09 LAB — MAGNESIUM: Magnesium: 1.9 mg/dL (ref 1.5–2.5)

## 2019-09-09 LAB — VITAMIN D 25 HYDROXY (VIT D DEFICIENCY, FRACTURES): Vit D, 25-Hydroxy: 34 ng/mL (ref 30–100)

## 2019-09-27 ENCOUNTER — Other Ambulatory Visit: Payer: Self-pay | Admitting: Obstetrics and Gynecology

## 2019-09-27 DIAGNOSIS — R928 Other abnormal and inconclusive findings on diagnostic imaging of breast: Secondary | ICD-10-CM

## 2019-10-11 ENCOUNTER — Ambulatory Visit: Payer: BLUE CROSS/BLUE SHIELD

## 2019-10-11 ENCOUNTER — Other Ambulatory Visit: Payer: Self-pay

## 2019-10-11 ENCOUNTER — Ambulatory Visit
Admission: RE | Admit: 2019-10-11 | Discharge: 2019-10-11 | Disposition: A | Discharge status: Home / Self Care | Payer: Managed Care, Other (non HMO) | Source: Ambulatory Visit | Attending: Obstetrics and Gynecology | Admitting: Obstetrics and Gynecology

## 2019-10-11 DIAGNOSIS — R928 Other abnormal and inconclusive findings on diagnostic imaging of breast: Secondary | ICD-10-CM

## 2019-10-19 HISTORY — PX: HIP ARTHROPLASTY: SHX981

## 2019-12-24 ENCOUNTER — Other Ambulatory Visit: Payer: Self-pay | Admitting: Physician Assistant

## 2020-01-07 NOTE — Progress Notes (Signed)
FOLLOW UP  Assessment and Plan:   Cholesterol Currently borderline elevations without medication Continue low cholesterol diet and exercise.  Check lipid panel.   Other abnormal glucose Recent A1Cs at goal Discussed diet/exercise, weight management  Defer A1C; check BMP  Obesity with co morbidities Long discussion about weight loss, diet, and exercise Recommended diet heavy in fruits and veggies and low in animal meats, cheeses, and dairy products, appropriate calorie intake Discussed ideal weight for height  Will follow up in 3 months  Vitamin D Def Below goal at last visit; she has started on supplementation since last check Check Vit D level  ADD Continue medications Helps with focus, no AE's. The patient was counseled on the addictive nature of the medication and was encouraged to take drug holidays when not needed.   Anxiety Well managed by current regimen; continue medications Stress management techniques discussed, increase water, good sleep hygiene discussed, increase exercise, and increase veggies.    Continue diet and meds as discussed. Further disposition pending results of labs. Discussed med's effects and SE's.   Over 30 minutes of exam, counseling, chart review, and critical decision making was performed.   Future Appointments  Date Time Provider [place]  07/10/2020  9:00 AM Vicie Mutters, PA-C [facility] None    ----------------------------------------------------------------------------------------------------------------------  HPI 44 y.o. female with intellectual disability presents accompanied by her mother for 3 month follow up on cholesterol, obesity, GERD, ADD, anxiety and vitamin D deficiency.   She is 9 weeks s/p left hip replacement after her right hip replacement. She states she has no pain other a little after she walks. No worsening leg swelling. She is on reclast.  Her mom states she has not been good about getting her iron  supplement. Would like that checked.  She had some constipation after the surgery but that has improved.   She is managed by psych NP Teryl Lucy.  She is on topamax for anxiety.   Patient is on an ADD medication, she states that the medication is helping and she denies any adverse reactions. Takes 15 mg adderall once daily in the mornings.   she has a diagnosis of anxiety and is currently on prozac 20 mg daily and has lorazepam but NEVER takes it reports symptoms are well controlled on current regimen.     BMI is Body mass index is 32.18 kg/m., she has not been working on diet but does exercise - taking walks with mom.  Wt Readings from Last 3 Encounters:  01/08/20 141 lb (64 kg)  09/06/19 143 lb (64.9 kg)  06/14/19 143 lb 6.4 oz (65 kg)   Today their BP is BP: 124/78  She does workout. She denies chest pain, shortness of breath, dizziness.   She is not on cholesterol medication and denies myalgias. Her cholesterol is not at goal. The cholesterol last visit was:   Lab Results  Component Value Date   CHOL 186 09/06/2019   HDL 73 09/06/2019   LDLCALC 100 (H) 09/06/2019   TRIG 45 09/06/2019   CHOLHDL 2.5 09/06/2019    She has been working on exercise for glucose management, and denies foot ulcerations, increased appetite, nausea, paresthesia of the feet, polydipsia, polyuria, visual disturbances, vomiting and weight loss. Last A1C in the office was:  Lab Results  Component Value Date   HGBA1C 5.1 06/14/2019   Patient is on Vitamin D supplement intermittently:   Lab Results  Component Value Date   VD25OH 34 09/06/2019  Current Medications:  Current Outpatient Medications on File Prior to Visit  Medication Sig  . amphetamine-dextroamphetamine (ADDERALL XR) 15 MG 24 hr capsule Take by mouth.  Marland Kitchen aspirin EC 81 MG tablet Take by mouth once.   . Cholecalciferol (VITAMIN D3) 125 MCG (5000 UT) TABS Take by mouth.  Marland Kitchen FLUoxetine (PROZAC) 20 MG capsule Take 60 mg by mouth  every morning.  . Magnesium 250 MG TABS Take by mouth daily.  . meloxicam (MOBIC) 7.5 MG tablet TAKE 1 TAB BY MOUTH 2X DAILY AS NEEDED FOR PAIN (OKAY TO TAKE WITH TYLENOL, NOT ALEVE, IBUPROFEN).  . Multiple Vitamin (MULTIVITAMIN) tablet Take 1 tablet by mouth daily.  Marland Kitchen topiramate (TOPAMAX) 100 MG tablet Take 100 mg by mouth 2 (two) times daily.  . vitamin C (ASCORBIC ACID) 500 MG tablet Take 500 mg by mouth daily.   No current facility-administered medications on file prior to visit.     Allergies:  Allergies  Allergen Reactions  . Fentanyl Other (See Comments)    O2 decreases  . Maxitrol [Neomycin-Polymyxin-Dexameth]   . Neomycin-Bacitracin Zn-Polymyx Swelling     Medical History:  Past Medical History:  Diagnosis Date  . ADD (attention deficit disorder)   . Hyperlipidemia   . Mental retardation   . OCD (obsessive compulsive disorder)   . Unspecified vitamin D deficiency    Family history- Reviewed and unchanged Social history- Reviewed and unchanged   Review of Systems:  Review of Systems  Constitutional: Negative for malaise/fatigue and weight loss.  HENT: Negative for hearing loss and tinnitus.   Eyes: Negative for blurred vision and double vision.  Respiratory: Negative for cough, shortness of breath and wheezing.   Cardiovascular: Negative for chest pain, palpitations, orthopnea, claudication, leg swelling and PND.  Gastrointestinal: Negative for abdominal pain, blood in stool, constipation, diarrhea, heartburn, melena, nausea and vomiting.  Genitourinary: Negative.   Musculoskeletal: Positive for falls. Negative for back pain, joint pain and myalgias.  Skin: Negative for rash.  Neurological: Negative for dizziness, tingling, sensory change, weakness and headaches.  Endo/Heme/Allergies: Negative for polydipsia.  Psychiatric/Behavioral: Negative.   All other systems reviewed and are negative.   Physical Exam: BP 124/78   Pulse 62   Temp 97.8 F (36.6 C)    Wt 141 lb (64 kg)   SpO2 97%   BMI 32.18 kg/m  Wt Readings from Last 3 Encounters:  01/08/20 141 lb (64 kg)  09/06/19 143 lb (64.9 kg)  06/14/19 143 lb 6.4 oz (65 kg)   General Appearance: Well nourished, in no apparent distress. Eyes: PERRLA, EOMs, conjunctiva no swelling or erythema, ecchymosis and bruising under left eye Sinuses: No Frontal/maxillary tenderness ENT/Mouth: Ext aud canals clear, TMs without erythema, bulging.Mouth and nose not examined- patient wearing a facemask Neck: Supple, thyroid normal.  Respiratory: Respiratory effort normal, BS equal bilaterally without rales, rhonchi, wheezing or stridor.  Cardio: RRR with no MRGs. Brisk peripheral pulses without edema.  Abdomen: Soft, + BS.  Non tender, no guarding, rebound, hernias, masses. Lymphatics: Non tender without lymphadenopathy.  Musculoskeletal: Full ROM, 5/5 strength, waddling gait Skin: Warm, dry without rashes, lesions, ecchymosis.  Neuro: Cranial nerves intact. No cerebellar symptoms.  Psych: Awake and oriented X 2, normal affect, Insight and Judgment poor.    Quentin Mulling, PA-C 8:55 AM [facility]

## 2020-01-08 ENCOUNTER — Encounter: Payer: Self-pay | Admitting: Physician Assistant

## 2020-01-08 ENCOUNTER — Ambulatory Visit (INDEPENDENT_AMBULATORY_CARE_PROVIDER_SITE_OTHER): Payer: Managed Care, Other (non HMO) | Admitting: Physician Assistant

## 2020-01-08 ENCOUNTER — Other Ambulatory Visit: Payer: Self-pay

## 2020-01-08 VITALS — BP 124/78 | HR 62 | Temp 97.8°F | Wt 141.0 lb

## 2020-01-08 DIAGNOSIS — E559 Vitamin D deficiency, unspecified: Secondary | ICD-10-CM | POA: Diagnosis not present

## 2020-01-08 DIAGNOSIS — F79 Unspecified intellectual disabilities: Secondary | ICD-10-CM

## 2020-01-08 DIAGNOSIS — E66811 Obesity, class 1: Secondary | ICD-10-CM

## 2020-01-08 DIAGNOSIS — E669 Obesity, unspecified: Secondary | ICD-10-CM

## 2020-01-08 DIAGNOSIS — Z79899 Other long term (current) drug therapy: Secondary | ICD-10-CM | POA: Diagnosis not present

## 2020-01-08 DIAGNOSIS — Z130 Encounter for screening for diseases of the blood and blood-forming organs and certain disorders involving the immune mechanism: Secondary | ICD-10-CM

## 2020-01-08 DIAGNOSIS — R7309 Other abnormal glucose: Secondary | ICD-10-CM

## 2020-01-08 DIAGNOSIS — E785 Hyperlipidemia, unspecified: Secondary | ICD-10-CM | POA: Diagnosis not present

## 2020-01-08 NOTE — Patient Instructions (Signed)

## 2020-01-09 LAB — FERRITIN: Ferritin: 20 ng/mL (ref 16–232)

## 2020-01-09 LAB — CBC WITH DIFFERENTIAL/PLATELET
Absolute Monocytes: 324 cells/uL (ref 200–950)
Basophils Absolute: 32 cells/uL (ref 0–200)
Basophils Relative: 0.7 %
Eosinophils Absolute: 99 cells/uL (ref 15–500)
Eosinophils Relative: 2.2 %
HCT: 42.5 % (ref 35.0–45.0)
Hemoglobin: 13.6 g/dL (ref 11.7–15.5)
Lymphs Abs: 1274 cells/uL (ref 850–3900)
MCH: 30.3 pg (ref 27.0–33.0)
MCHC: 32 g/dL (ref 32.0–36.0)
MCV: 94.7 fL (ref 80.0–100.0)
MPV: 9.2 fL (ref 7.5–12.5)
Monocytes Relative: 7.2 %
Neutro Abs: 2772 cells/uL (ref 1500–7800)
Neutrophils Relative %: 61.6 %
Platelets: 308 10*3/uL (ref 140–400)
RBC: 4.49 10*6/uL (ref 3.80–5.10)
RDW: 11.3 % (ref 11.0–15.0)
Total Lymphocyte: 28.3 %
WBC: 4.5 10*3/uL (ref 3.8–10.8)

## 2020-01-09 LAB — COMPLETE METABOLIC PANEL WITH GFR
AG Ratio: 1.8 (calc) (ref 1.0–2.5)
ALT: 10 U/L (ref 6–29)
AST: 20 U/L (ref 10–30)
Albumin: 4.6 g/dL (ref 3.6–5.1)
Alkaline phosphatase (APISO): 98 U/L (ref 31–125)
BUN: 18 mg/dL (ref 7–25)
CO2: 27 mmol/L (ref 20–32)
Calcium: 10.1 mg/dL (ref 8.6–10.2)
Chloride: 102 mmol/L (ref 98–110)
Creat: 0.57 mg/dL (ref 0.50–1.10)
GFR, Est African American: 133 mL/min/{1.73_m2} (ref >=60)
GFR, Est Non African American: 114 mL/min/{1.73_m2} (ref >=60)
Globulin: 2.5 g/dL (calc) (ref 1.9–3.7)
Glucose, Bld: 76 mg/dL (ref 65–99)
Potassium: 4.5 mmol/L (ref 3.5–5.3)
Sodium: 136 mmol/L (ref 135–146)
Total Bilirubin: 0.5 mg/dL (ref 0.2–1.2)
Total Protein: 7.1 g/dL (ref 6.1–8.1)

## 2020-01-09 LAB — MAGNESIUM: Magnesium: 2 mg/dL (ref 1.5–2.5)

## 2020-01-09 LAB — LIPID PANEL
Cholesterol: 207 mg/dL — ABNORMAL HIGH (ref <200)
HDL: 70 mg/dL (ref >=50)
LDL Cholesterol (Calc): 118 mg/dL (calc) — ABNORMAL HIGH
Non-HDL Cholesterol (Calc): 137 mg/dL (calc) — ABNORMAL HIGH (ref <130)
Total CHOL/HDL Ratio: 3 (calc) (ref <5.0)
Triglycerides: 89 mg/dL (ref <150)

## 2020-01-09 LAB — HEMOGLOBIN A1C
Hgb A1c MFr Bld: 5 % of total Hgb (ref <5.7)
Mean Plasma Glucose: 97 (calc)
eAG (mmol/L): 5.4 (calc)

## 2020-01-09 LAB — VITAMIN B12: Vitamin B-12: 938 pg/mL (ref 200–1100)

## 2020-01-09 LAB — TSH: TSH: 0.8 mIU/L

## 2020-01-09 LAB — IRON, TOTAL/TOTAL IRON BINDING CAP
%SAT: 28 % (calc) (ref 16–45)
Iron: 105 ug/dL (ref 40–190)
TIBC: 374 mcg/dL (calc) (ref 250–450)

## 2020-01-09 LAB — VITAMIN D 25 HYDROXY (VIT D DEFICIENCY, FRACTURES): Vit D, 25-Hydroxy: 36 ng/mL (ref 30–100)

## 2020-02-23 ENCOUNTER — Encounter: Payer: Self-pay | Admitting: Podiatry

## 2020-02-23 ENCOUNTER — Ambulatory Visit (INDEPENDENT_AMBULATORY_CARE_PROVIDER_SITE_OTHER): Payer: Managed Care, Other (non HMO) | Admitting: Podiatry

## 2020-02-23 ENCOUNTER — Other Ambulatory Visit: Payer: Self-pay

## 2020-02-23 DIAGNOSIS — M79604 Pain in right leg: Secondary | ICD-10-CM

## 2020-02-23 DIAGNOSIS — M79675 Pain in left toe(s): Secondary | ICD-10-CM

## 2020-02-23 DIAGNOSIS — B351 Tinea unguium: Secondary | ICD-10-CM

## 2020-02-23 DIAGNOSIS — M79605 Pain in left leg: Secondary | ICD-10-CM

## 2020-02-23 DIAGNOSIS — M79674 Pain in right toe(s): Secondary | ICD-10-CM

## 2020-02-23 DIAGNOSIS — Q828 Other specified congenital malformations of skin: Secondary | ICD-10-CM

## 2020-02-24 NOTE — Progress Notes (Signed)
Subjective: Cathy Howard presents today, accompanied by her mother,  with painful, thick toenails 1-5 b/l that she cannot cut and which interfere with daily activities.  Pain is aggravated when wearing enclosed shoe gear.  Mom states she and Cathy Howard walk nearly everyday for exercise. Cathy Howard has had bilateral hip surgery and has recovered from both and has resumed her routine activities.  Her mother voices no new pedal concerns on today's visit.   Unk Pinto, MD is her PCP.    Current Outpatient Medications:  .  acetaminophen (TYLENOL) 500 MG tablet, Take by mouth., Disp: , Rfl:  .  amoxicillin (AMOXIL) 500 MG capsule, SMARTSIG:4 Capsule(s) By Mouth Once, Disp: , Rfl:  .  amphetamine-dextroamphetamine (ADDERALL XR) 15 MG 24 hr capsule, Take by mouth., Disp: , Rfl:  .  aspirin EC 81 MG tablet, Take by mouth once. , Disp: , Rfl:  .  celecoxib (CELEBREX) 200 MG capsule, Take 200 mg by mouth daily., Disp: , Rfl:  .  cephALEXin (KEFLEX) 500 MG capsule, TAKE 1 CAPSULE BY MOUTH 2 TIMES DAILY FOR 7 DAYS., Disp: , Rfl:  .  cetirizine (ZYRTEC) 10 MG tablet, Take by mouth., Disp: , Rfl:  .  Cholecalciferol (VITAMIN D3) 125 MCG (5000 UT) TABS, Take by mouth., Disp: , Rfl:  .  FLUoxetine (PROZAC) 20 MG capsule, Take 60 mg by mouth every morning., Disp: , Rfl: 5 .  Lactobacillus Rhamnosus, GG, (RA PROBIOTIC DIGESTIVE CARE) CAPS, Take by mouth., Disp: , Rfl:  .  Magnesium 250 MG TABS, Take by mouth daily., Disp: , Rfl:  .  meloxicam (MOBIC) 7.5 MG tablet, TAKE 1 TAB BY MOUTH 2X DAILY AS NEEDED FOR PAIN (OKAY TO TAKE WITH TYLENOL, NOT ALEVE, IBUPROFEN)., Disp: 60 tablet, Rfl: 5 .  Multiple Vitamin (MULTIVITAMIN) tablet, Take 1 tablet by mouth daily., Disp: , Rfl:  .  mupirocin ointment (BACTROBAN) 2 %, PLEASE SEE ATTACHED FOR DETAILED DIRECTIONS, Disp: , Rfl:  .  oxyCODONE (OXY IR/ROXICODONE) 5 MG immediate release tablet, Take 5 mg by mouth every 4 (four) hours as needed., Disp: , Rfl:  .  SENNA PLUS 8.6-50  MG tablet, TAKE 2 TABLETS BY MOUTH 2 TIMES DAILY WHILE TAKING NARCOTIC PAIN MEDICATION FOR THE PREVENTION OF CONSTIPATION. HOLD FOR DIARRHEA., Disp: , Rfl:  .  topiramate (TOPAMAX) 100 MG tablet, Take 100 mg by mouth 2 (two) times daily., Disp: , Rfl:  .  vitamin C (ASCORBIC ACID) 500 MG tablet, Take 500 mg by mouth daily., Disp: , Rfl:   Allergies  Allergen Reactions  . Fentanyl Other (See Comments)    O2 decreases  . Maxitrol [Neomycin-Polymyxin-Dexameth]   . Neomycin-Bacitracin Zn-Polymyx Swelling  . Quetiapine Other (See Comments)    Over sedation     Objective: There were no vitals filed for this visit.  Vascular Examination: Capillary refill time immediate x 10 digits.  Dorsalis pedis and Posterior tibial pulses palpable b/l.  Digital hair present x 10 digits.  Skin temperature gradient WNL b/l  Dermatological Examination: Skin with normal turgor, texture and tone b/l.  Toenails 1-5 b/l discolored, thick, dystrophic with subungual debris and pain with palpation to nailbeds due to thickness of nails.  Porokeratotic lesions sub 5th met base left foot with tenderness to palpation. No erythema, no edema, no drainage, no flocculence.  Musculoskeletal: Muscle strength 5/5 to all LE muscle groups  Pes planus foot deformity b/l.  HAV with bunion deformity b/l.  Flexible hammertoe deformity lesser digits b/l.  Neurological: Unable to  assess sensation due to cognition.   Assessment: Painful onychomycosis toenails 1-5 b/l  Porokeratosis left foot Pain in foot  Plan: 1. Toenails 1-5 b/l were debrided in length and girth without iatrogenic bleeding. Porokeratosis sub 5th met base left foot pared and enucleated with sterile scalpel blade without incident. Patient to continue soft, supportive shoe gear daily. Patient to report any pedal injuries to medical professional immediately. Follow up 3 months.  Patient/POA to call should there be a concern in the interim.

## 2020-05-06 ENCOUNTER — Ambulatory Visit (INDEPENDENT_AMBULATORY_CARE_PROVIDER_SITE_OTHER): Payer: Managed Care, Other (non HMO)

## 2020-05-06 ENCOUNTER — Other Ambulatory Visit: Payer: Self-pay

## 2020-05-06 VITALS — Temp 97.3°F

## 2020-05-06 DIAGNOSIS — Z23 Encounter for immunization: Secondary | ICD-10-CM | POA: Diagnosis not present

## 2020-05-31 ENCOUNTER — Ambulatory Visit (INDEPENDENT_AMBULATORY_CARE_PROVIDER_SITE_OTHER): Payer: Managed Care, Other (non HMO) | Admitting: Podiatry

## 2020-05-31 ENCOUNTER — Other Ambulatory Visit: Payer: Self-pay

## 2020-05-31 DIAGNOSIS — M79604 Pain in right leg: Secondary | ICD-10-CM | POA: Diagnosis not present

## 2020-05-31 DIAGNOSIS — M79675 Pain in left toe(s): Secondary | ICD-10-CM | POA: Diagnosis not present

## 2020-05-31 DIAGNOSIS — S91116A Laceration without foreign body of unspecified lesser toe(s) without damage to nail, initial encounter: Secondary | ICD-10-CM

## 2020-05-31 DIAGNOSIS — B351 Tinea unguium: Secondary | ICD-10-CM | POA: Diagnosis not present

## 2020-05-31 DIAGNOSIS — M79605 Pain in left leg: Secondary | ICD-10-CM

## 2020-05-31 DIAGNOSIS — Q828 Other specified congenital malformations of skin: Secondary | ICD-10-CM | POA: Diagnosis not present

## 2020-05-31 DIAGNOSIS — M79674 Pain in right toe(s): Secondary | ICD-10-CM | POA: Diagnosis not present

## 2020-06-02 ENCOUNTER — Encounter: Payer: Self-pay | Admitting: Podiatry

## 2020-06-02 NOTE — Progress Notes (Signed)
Subjective: Cathy Howard presents today, accompanied by her mother,  with painful, thick toenails 1-5 b/l that she cannot cut and which interfere with daily activities.  Pain is aggravated when wearing enclosed shoe gear.  Mom states she believes the right 2nd digit has been cut by the toenail of the right 3rd digit toenail.   Her mother voices no new pedal concerns on today's visit.   Current Outpatient Medications:    acetaminophen (TYLENOL) 500 MG tablet, Take by mouth., Disp: , Rfl:    amoxicillin (AMOXIL) 500 MG capsule, SMARTSIG:4 Capsule(s) By Mouth Once, Disp: , Rfl:    amphetamine-dextroamphetamine (ADDERALL XR) 15 MG 24 hr capsule, Take by mouth., Disp: , Rfl:    aspirin EC 81 MG tablet, Take by mouth once. , Disp: , Rfl:    celecoxib (CELEBREX) 200 MG capsule, Take 200 mg by mouth daily., Disp: , Rfl:    cephALEXin (KEFLEX) 500 MG capsule, TAKE 1 CAPSULE BY MOUTH 2 TIMES DAILY FOR 7 DAYS., Disp: , Rfl:    cetirizine (ZYRTEC) 10 MG tablet, Take by mouth., Disp: , Rfl:    Cholecalciferol (VITAMIN D3) 125 MCG (5000 UT) TABS, Take by mouth., Disp: , Rfl:    FLUoxetine (PROZAC) 20 MG capsule, Take 60 mg by mouth every morning., Disp: , Rfl: 5   Lactobacillus Rhamnosus, GG, (RA PROBIOTIC DIGESTIVE CARE) CAPS, Take by mouth., Disp: , Rfl:    Magnesium 250 MG TABS, Take by mouth daily., Disp: , Rfl:    meloxicam (MOBIC) 7.5 MG tablet, TAKE 1 TAB BY MOUTH 2X DAILY AS NEEDED FOR PAIN (OKAY TO TAKE WITH TYLENOL, NOT ALEVE, IBUPROFEN)., Disp: 60 tablet, Rfl: 5   Multiple Vitamin (MULTIVITAMIN) tablet, Take 1 tablet by mouth daily., Disp: , Rfl:    mupirocin ointment (BACTROBAN) 2 %, PLEASE SEE ATTACHED FOR DETAILED DIRECTIONS, Disp: , Rfl:    oxyCODONE (OXY IR/ROXICODONE) 5 MG immediate release tablet, Take 5 mg by mouth every 4 (four) hours as needed., Disp: , Rfl:    SENNA PLUS 8.6-50 MG tablet, TAKE 2 TABLETS BY MOUTH 2 TIMES DAILY WHILE TAKING NARCOTIC PAIN MEDICATION FOR  THE PREVENTION OF CONSTIPATION. HOLD FOR DIARRHEA., Disp: , Rfl:    topiramate (TOPAMAX) 100 MG tablet, Take 100 mg by mouth 2 (two) times daily., Disp: , Rfl:    vitamin C (ASCORBIC ACID) 500 MG tablet, Take 500 mg by mouth daily., Disp: , Rfl:   Allergies  Allergen Reactions   Fentanyl Other (See Comments)    O2 decreases   Maxitrol [Neomycin-Polymyxin-Dexameth]    Neomycin-Bacitracin Zn-Polymyx Swelling   Quetiapine Other (See Comments)    Over sedation     Objective: There were no vitals filed for this visit.  Vascular Examination: Capillary refill time immediate x 10 digits.  Dorsalis pedis and Posterior tibial pulses palpable b/l.  Digital hair present x 10 digits.  Skin temperature gradient WNL b/l  Dermatological Examination: Skin with normal turgor, texture and tone b/l.  Toenails 1-5 b/l discolored, thick, dystrophic with subungual debris and pain with palpation to nailbeds due to thickness of nails.  She has laceration of the lateral aspect of the right 2nd toe from the abutting toenail of the right 3rd digit. There is dried heme noted. No erythema, no edema, no active bleeding, no purulence.  Porokeratotic lesions sub heel left foot and submet head 2 left foot with tenderness to palpation. No erythema, no edema, no drainage, no flocculence.  Musculoskeletal: Muscle strength 5/5 to all LE  muscle groups  Pes planus foot deformity b/l.  HAV with bunion deformity b/l.  Flexible hammertoe deformity digits 2-5 b/l.  Neurological: Unable to assess sensation due to cognition.   Assessment: Painful onychomycosis toenails 1-5 b/l  Laceration right 2nd digit Porokeratosis sub heel left foot and submet head 2 left foot Pain in foot  Plan: 1. Toenails 1-5 b/l were debrided in length and girth without iatrogenic bleeding. 2. Laceration right 2nd digit cleansed with wound cleanser. Triple antibiotic ointment and band-aid applied to digit. Mother was  instructed to apply antibiotic ointment to the digit once daily until resolved. Porokeratosis sub heel left foot and submet head 2 left foot pared and enucleated with sterile scalpel blade without incident. Patient to continue soft, supportive shoe gear daily. Patient to report any pedal injuries to medical professional immediately. Follow up 3 months.  Patient/POA to call should there be a concern in the interim.

## 2020-06-17 ENCOUNTER — Encounter: Payer: Self-pay | Admitting: Adult Health

## 2020-06-17 ENCOUNTER — Ambulatory Visit (INDEPENDENT_AMBULATORY_CARE_PROVIDER_SITE_OTHER): Payer: Managed Care, Other (non HMO) | Admitting: Adult Health

## 2020-06-17 ENCOUNTER — Other Ambulatory Visit: Payer: Self-pay

## 2020-06-17 VITALS — BP 106/66 | HR 72 | Temp 97.5°F | Wt 138.4 lb

## 2020-06-17 DIAGNOSIS — T148XXA Other injury of unspecified body region, initial encounter: Secondary | ICD-10-CM

## 2020-06-17 NOTE — Progress Notes (Signed)
Assessment and Plan:  Cathy Howard was seen today for acute visit.  Diagnoses and all orders for this visit:  Hematoma and contusion Exam most consistent with localized hematoma and contusion without complication Otherwise benign exam; joint exam throughout is otherwise consistent with baseline, low suspicion of underlying bony abnormality, distal circulation is intact Reassured patient and family; observe for slow expected resolution over next 2-3 weeks; can try applying heat; Follow up if not resolving as expected or any new concerns, reports of pain or suspected injury.   Further disposition pending results of labs. Discussed med's effects and SE's.   Over 15 minutes of exam, counseling, chart review, and critical decision making was performed.   Future Appointments  Date Time Provider Department Center  07/10/2020  9:00 AM Elder Negus, NP [facility] None  09/09/2020  8:45 AM Freddie Breech, DPM [facility]    ------------------------------------------------------------------------------------------------------------------   HPI BP 106/66    Pulse 72    Temp (!) 97.5 F (36.4 C)    Wt 138 lb 6.4 oz (62.8 kg)    SpO2 96%    BMI 31.59 kg/m   Cathy Howard with intellectual disability presents accompanied by her mother for evaluation of lump and bruise to right forearm.   Her mother noted when she checked on her Saturday AM, patient reported falling out of bed and ? hit on bed overnight, noted purple bruise and lump under skin. No worsening since that time. Patient denies any pain today. Mother hasn't noted her avoiding lifting or avoiding using extremity, c/o any pain.   Past Medical History:  Diagnosis Date   ADD (attention deficit disorder)    Hyperlipidemia    Mental retardation    OCD (obsessive compulsive disorder)    Unspecified vitamin D deficiency      Allergies  Allergen Reactions   Fentanyl Other (See Comments)    O2 decreases   Maxitrol  [Neomycin-Polymyxin-Dexameth]    Neomycin-Bacitracin Zn-Polymyx Swelling   Quetiapine Other (See Comments)    Over sedation     Current Outpatient Medications on File Prior to Visit  Medication Sig   acetaminophen (TYLENOL) 500 MG tablet Take by mouth.   amoxicillin (AMOXIL) 500 MG capsule SMARTSIG:4 Capsule(s) By Mouth Once   amphetamine-dextroamphetamine (ADDERALL XR) 15 MG 24 hr capsule Take by mouth.   aspirin EC 81 MG tablet Take by mouth once.    cetirizine (ZYRTEC) 10 MG tablet Take by mouth.   Cholecalciferol (VITAMIN D3) 125 MCG (5000 UT) TABS Take by mouth.   FLUoxetine (PROZAC) 20 MG capsule Take 60 mg by mouth every morning.   Lactobacillus Rhamnosus, GG, (RA PROBIOTIC DIGESTIVE CARE) CAPS Take by mouth.   Magnesium 250 MG TABS Take by mouth daily.   meloxicam (MOBIC) 7.5 MG tablet TAKE 1 TAB BY MOUTH 2X DAILY AS NEEDED FOR PAIN (OKAY TO TAKE WITH TYLENOL, NOT ALEVE, IBUPROFEN).   Multiple Vitamin (MULTIVITAMIN) tablet Take 1 tablet by mouth daily.   topiramate (TOPAMAX) 100 MG tablet Take 100 mg by mouth 2 (two) times daily.   vitamin C (ASCORBIC ACID) 500 MG tablet Take 500 mg by mouth daily.   celecoxib (CELEBREX) 200 MG capsule Take 200 mg by mouth daily.   cephALEXin (KEFLEX) 500 MG capsule TAKE 1 CAPSULE BY MOUTH 2 TIMES DAILY FOR 7 DAYS.   mupirocin ointment (BACTROBAN) 2 % PLEASE SEE ATTACHED FOR DETAILED DIRECTIONS (Patient not taking: Reported on 06/17/2020)   oxyCODONE (OXY IR/ROXICODONE) 5 MG immediate release tablet Take 5 mg  by mouth every 4 (four) hours as needed.   SENNA PLUS 8.6-50 MG tablet TAKE 2 TABLETS BY MOUTH 2 TIMES DAILY WHILE TAKING NARCOTIC PAIN MEDICATION FOR THE PREVENTION OF CONSTIPATION. HOLD FOR DIARRHEA.   No current facility-administered medications on file prior to visit.    ROS: all negative except above.   Physical Exam:  BP 106/66    Pulse 72    Temp (!) 97.5 F (36.4 C)    Wt 138 lb 6.4 oz (62.8 kg)    SpO2 96%     BMI 31.59 kg/m   General Appearance: Well nourished, in no apparent distress. Eyes: PERRLA, EOMs, conjunctiva no swelling or erythema  ENT/Mouth: Ext aud canals clear, TMs without erythema, bulging. and nose not examined- patient wearing a facemask Neck: Supple, thyroid normal.  Respiratory: Respiratory effort normal, BS equal bilaterally without rales, rhonchi, wheezing or stridor.  Cardio: RRR with no MRGs. Brisk peripheral pulses without edema.  Abdomen: Soft, + BS.  Non tender, no guarding. Lymphatics: Non tender without lymphadenopathy.  Musculoskeletal: Full ROM, 5/5 strength, waddling gait. No bony tenderness of right forearm.  Skin: Warm, dry without rashes, lesions. She has ecchymosis approx 8 cm area, purple, to right posterior forearm with 1.5 cm palpable smooth mildly tender lump in soft tissue.  Neuro: Cranial nerves intact. No cerebellar symptoms.  Psych: Awake and oriented X 2, normal affect, Insight and Judgment poor.     Cathy Maker, NP 3:10 PM [facility]

## 2020-06-17 NOTE — Patient Instructions (Signed)
Hematoma A hematoma is a collection of blood under the skin, in an organ, in a body space, in a joint space, or in other tissue. The blood can thicken (clot) to form a lump that you can see and feel. The lump is often firm and may become sore and tender. Most hematomas get better in a few days to weeks. However, some hematomas may be serious and require medical care. Hematomas can range from very small to very large. What are the causes? This condition is caused by:  A blunt or penetrating injury.  A leakage from a blood vessel under the skin.  Some medical procedures, including surgeries, such as oral surgery, face lifts, and surgeries on the joints.  Some medical conditions that cause bleeding or bruising. There may be multiple hematomas that appear in different areas of the body. What increases the risk? You are more likely to develop this condition if:  You are an older adult.  You use blood thinners. What are the signs or symptoms?  Symptoms of this condition depend on where the hematoma is located.  Common symptoms of a hematoma that is under the skin include:  A firm lump on the body.  Pain and tenderness in the area.  Bruising. Blue, dark blue, purple-red, or yellowish skin (discoloration) may appear at the site of the hematoma if the hematoma is close to the surface of the skin. Common symptoms of a hematoma that is deep in the tissues or body spaces may be less obvious. They include:  A collection of blood in the stomach (intra-abdominal hematoma). This may cause pain in the abdomen, weakness, fainting, and shortness of breath.  A collection of blood in the head (intracranial hematoma). This may cause a headache or symptoms such as weakness, trouble speaking or understanding, or a change in consciousness. How is this diagnosed? This condition is diagnosed based on:  Your medical history.  A physical exam.  Imaging tests, such as an ultrasound or CT scan. These may  be needed if your health care provider suspects a hematoma in deeper tissues or body spaces.  Blood tests. These may be needed if your health care provider believes that the hematoma is caused by a medical condition. How is this treated? Treatment for this condition depends on the cause, size, and location of the hematoma. Treatment may include:  Doing nothing. The majority of hematomas do not need treatment as many of them go away on their own over time.  Surgery or close monitoring. This may be needed for large hematomas or hematomas that affect vital organs.  Medicines. Medicines may be given if there is an underlying medical cause for the hematoma. Follow these instructions at home: Managing pain, stiffness, and swelling   If directed, put ice on the affected area. ? Put ice in a plastic bag. ? Place a towel between your skin and the bag. ? Leave the ice on for 20 minutes, 2-3 times a day for the first couple of days.  If directed, apply heat to the affected area after applying ice for a couple of days. Use the heat source that your health care provider recommends, such as a moist heat pack or a heating pad. ? Place a towel between your skin and the heat source. ? Leave the heat on for 20-30 minutes. ? Remove the heat if your skin turns bright red. This is especially important if you are unable to feel pain, heat, or cold. You may have a greater   risk of getting burned.  Raise (elevate) the affected area above the level of your heart while you are sitting or lying down.  If told, wrap the affected area with an elastic bandage. The bandage applies pressure (compression) to the area, which may help to reduce swelling and promote healing. Do not wrap the bandage too tightly around the affected area.  If your hematoma is on a leg or foot (lower extremity) and is painful, your health care provider may recommend crutches. Use them as told by your health care provider. General  instructions  Take over-the-counter and prescription medicines only as told by your health care provider.  Keep all follow-up visits as told by your health care provider. This is important. Contact a health care provider if:  You have a fever.  The swelling or discoloration gets worse.  You develop more hematomas. Get help right away if:  Your pain is worse or your pain is not controlled with medicine.  Your skin over the hematoma breaks or starts bleeding.  Your hematoma is in your chest or abdomen and you have weakness, shortness of breath, or a change in consciousness.  You have a hematoma on your scalp that is caused by a fall or injury, and you also have: ? A headache that gets worse. ? Trouble speaking or understanding speech. ? Weakness. ? Change in alertness or consciousness. Summary  A hematoma is a collection of blood under the skin, in an organ, in a body space, in a joint space, or in other tissue.  This condition usually does not need treatment because many hematomas go away on their own over time.  Large hematomas, or those that may affect vital organs, may need surgical drainage or monitoring. If the hematoma is caused by a medical condition, medicines may be prescribed.  Get help right away if your hematoma breaks or starts to bleed, you have shortness of breath, or you have a headache or trouble speaking after a fall. This information is not intended to replace advice given to you by your health care provider. Make sure you discuss any questions you have with your health care provider. Document Revised: 11/30/2018 Document Reviewed: 12/09/2017 Elsevier Patient Education  2020 [employer]  

## 2020-07-09 NOTE — Progress Notes (Signed)
COMPLETE PHYSICAL   Assessment / Plan:   Encounter for general adult medical examination with abnormal findings Yearly  Intellectual disability Continue follow up with psych, mom is here with patient  Developmental delay Continue follow up with psych, mom is here with patient  Vitamin D deficiency Continue supplementation to maintain goal of 70-100 Taking Vitamin D 5,000 IU daily Defer vitamin D level -     Vitamin D (25 hydroxy)  Hyperlipidemia, unspecified hyperlipidemia type -     Lipid Profile check lipids decrease fatty foods increase activity.   Attention deficit disorder, unspecified hyperactivity presence -  Continue ADD medication: Adderall XR 15mg  daily Helps with focus, no AE's. The patient was counseled on the addictive nature of the medication and was encouraged to take drug holidays when not needed.   Obesity (BMI 30.0-34.9) - follow up 3 months for progress monitoring - increase veggies, decrease carbs - long discussion about weight loss, diet, and exercise Planning on going back to daily activity center sometime in Jan 2022.  Anxiety Doing well on current regiment Continue fluoxetine 20mg  daily Discussed stress management techniques   Discussed good sleep hygiene Discussed increasing physical activity and exercise Increase water intake -Continue follow up psych  Arthritis, lumbar spine Continue follow up ortho, prn  Hip dysplasia, congenital S/P THR bilateral  Gastroesophageal reflux disease without esophagitis Continue PPI/H2 blocker, diet discussed  Abnormal glucose -     Hemoglobin A1c ([facility])  Medication management Continued  Screening for hematuria or proteinuria -     Cancel: Urinalysis, Routine w reflex microscopic -     Cancel: Microalbumin / Creatinine Urine Ratio -     Urinalysis, Routine w reflex microscopic -     Microalbumin / Creatinine Urine Ratio  Encounter for vitamin deficiency screening -     Vitamin  B12  Screening, ischemic heart disease -     EKG 12-Lead  Screening, iron deficiency anemia -     Iron,Total/Total Iron Binding Cap  Close exposure to 2019 novel coronavirus -     SARS-CoV-2 Semi-Quantitative Total Antibody, Spike  Otalgia of both ears -     Ear Lavage  Bilateral impacted cerumen -     Ear Lavage Pt tolerated well, bilateral canals clear  . Further disposition pending results if labs check today. Discussed med's effects and SE's.   Over 30 minutes of face to face interview, exam, counseling, chart review, and critical decision making was performed.   Future Appointments  Date Time Provider Department Center  09/09/2020  8:45 AM Freddie BreechGalaway, Jennifer L, [place] [facility]  11/06/2020  9:30 AM Judd Gaudierorbett, Ashley, NP [facility] None  07/10/2021  9:00 AM Elder NegusMcClanahan, Ziah Turvey, NP [facility] None    HPI  44 y.o. female  presents for a complete physical.  She is here with her mother.  She has been going to daily activity center prior to COVID19.  [facility] Thurnell LosePlate on 40/98/119110/27/2020. She also had a fusion of spin thoracolumbar region 02/27/2019. Has some bursitis in that hip, has finished the celebrex.  Had left hip replacement in 10/2019 this year.  She has one year follow up.  She is doing well with this.  She is walking 30-2860min every morning.   Her blood pressure has been controlled at home, today their BP is BP: 124/80.  She does not workout. She denies chest pain, shortness of breath, dizziness.  She has MR, mom is here with her, she picks her skin. She  follows with psych for behavioral issues from her MR such as inattention, anxiety, et.  She is on cholesterol medication and denies myalgias. Her cholesterol is not at goal. The cholesterol last visit was:  Lab Results  Component Value Date   CHOL 243 (H) 07/10/2020   HDL 74 07/10/2020   LDLCALC 148 (H) 07/10/2020   TRIG 97 07/10/2020   CHOLHDL 3.3 07/10/2020  . Her A1c has been  controlled with diet.  Her mother reports that she does eat cake icing and also eating mayonaise sandwiches.  She reports  Last A1C in the office was:  Lab Results  Component Value Date   HGBA1C 5.6 07/10/2020   Patient is on Vitamin D supplement, started 2 weeks ago 5000 IU a day.   Lab Results  Component Value Date   VD25OH 43 07/10/2020     BMI is Body mass index is 30.71 kg/m., she is working on diet and exercise. Wt Readings from Last 3 Encounters:  07/10/20 137 lb (62.1 kg)  06/17/20 138 lb 6.4 oz (62.8 kg)  01/08/20 141 lb (64 kg)     Current Medications:  Current Outpatient Medications on File Prior to Visit  Medication Sig Dispense Refill   acetaminophen (TYLENOL) 500 MG tablet Take by mouth.     amphetamine-dextroamphetamine (ADDERALL XR) 15 MG 24 hr capsule Take by mouth.     aspirin EC 81 MG tablet Take by mouth once.      cetirizine (ZYRTEC) 10 MG tablet Take by mouth.     Cholecalciferol (VITAMIN D3) 125 MCG (5000 UT) TABS Take by mouth.     FLUoxetine (PROZAC) 20 MG capsule Take 60 mg by mouth every morning.  5   Lactobacillus Rhamnosus, GG, (RA PROBIOTIC DIGESTIVE CARE) CAPS Take by mouth.     Magnesium 250 MG TABS Take by mouth daily.     meloxicam (MOBIC) 7.5 MG tablet TAKE 1 TAB BY MOUTH 2X DAILY AS NEEDED FOR PAIN (OKAY TO TAKE WITH TYLENOL, NOT ALEVE, IBUPROFEN). 60 tablet 5   Multiple Vitamin (MULTIVITAMIN) tablet Take 1 tablet by mouth daily.     topiramate (TOPAMAX) 100 MG tablet Take 100 mg by mouth 2 (two) times daily.     vitamin C (ASCORBIC ACID) 500 MG tablet Take 500 mg by mouth daily.     amoxicillin (AMOXIL) 500 MG capsule SMARTSIG:4 Capsule(s) By Mouth Once (Patient not taking: Reported on 07/10/2020)     mupirocin ointment (BACTROBAN) 2 % PLEASE SEE ATTACHED FOR DETAILED DIRECTIONS (Patient not taking: Reported on 07/10/2020)     No current facility-administered medications on file prior to visit.    Health Maintenance:    Immunization History  Administered Date(s) Administered   Influenza Inj Mdck Quad With Preservative 06/03/2017, 06/06/2018, 05/06/2020   Influenza Split 04/15/2015   Influenza,inj,Quad PF,6+ Mos 04/11/2019   Influenza,inj,quad, With Preservative 04/14/2016   Influenza-Unspecified 05/24/2014   PFIZER SARS-COV-2 Vaccination 10/04/2019, 10/25/2019   PPD Test 02/19/2014   Tdap 02/07/2013   Tetanus: 2014 Flu: 04/2020 Pap:has not had related to anatomy, follows with Dr Ambrose Mantle - 09/2019 2021-Eye Exam 03/2020 - Dentist  Patient Care Team: Lucky Cowboy, MD as PCP - General (Internal Medicine) Tracey Harries, MD as Consulting Physician (Obstetrics and Gynecology) Mat Carne, DO (Inactive) (Optometry) Regal, Kirstie Peri, DPM as Consulting Physician (Podiatry)  Medical History:  Past Medical History:  Diagnosis Date   ADD (attention deficit disorder)    Hyperlipidemia    Mental retardation    OCD (  obsessive compulsive disorder)    Unspecified vitamin D deficiency    Allergies Allergies  Allergen Reactions   Fentanyl Other (See Comments)    O2 decreases   Maxitrol [Neomycin-Polymyxin-Dexameth]    Neomycin-Bacitracin Zn-Polymyx Swelling   Quetiapine Other (See Comments)    Over sedation     SURGICAL HISTORY She  has a past surgical history that includes Hip surgery; scoliosis surgery; Eye surgery; and Hip Arthroplasty (Right, 05/01/2019).   FAMILY HISTORY Her family history includes Cancer in her mother; Diabetes in her maternal grandmother; Heart disease in her maternal grandmother and mother; Hyperlipidemia in her mother; Stroke in her maternal grandmother.   SOCIAL HISTORY She  reports that she has never smoked. She has never used smokeless tobacco. She reports that she does not drink alcohol and does not use drugs.  Review of Systems: Review of Systems  Constitutional: Negative for chills, diaphoresis, fever, malaise/fatigue and weight loss.  HENT:  Negative for congestion, ear discharge, ear pain, hearing loss, nosebleeds, sinus pain, sore throat and tinnitus.   Eyes: Negative.  Negative for blurred vision, double vision, photophobia, pain, discharge and redness.  Respiratory: Negative for cough, hemoptysis, sputum production, shortness of breath, wheezing and stridor.   Cardiovascular: Negative for chest pain, palpitations, orthopnea, claudication, leg swelling and PND.  Gastrointestinal: Negative for abdominal pain, blood in stool, constipation, diarrhea, heartburn, melena, nausea and vomiting.  Genitourinary: Negative.  Negative for dysuria, flank pain, frequency, hematuria and urgency.  Musculoskeletal: Negative for back pain, falls, joint pain, myalgias and neck pain.  Skin: Negative.  Negative for itching and rash.  Neurological: Negative for dizziness, tingling, tremors, sensory change, speech change, focal weakness, seizures, loss of consciousness, weakness and headaches.  Endo/Heme/Allergies: Negative for environmental allergies and polydipsia. Does not bruise/bleed easily.  Psychiatric/Behavioral: Negative for depression, hallucinations, memory loss, substance abuse and suicidal ideas. The patient is not nervous/anxious and does not have insomnia.     Physical Exam: Estimated body mass index is 30.71 kg/m as calculated from the following:   Height as of this encounter: 4\' 8"  (1.422 m).   Weight as of this encounter: 137 lb (62.1 kg). BP 124/80    Pulse 68    Temp 98.1 F (36.7 C)    Ht 4\' 8"  (1.422 m)    Wt 137 lb (62.1 kg)    SpO2 98%    BMI 30.71 kg/m   General Appearance: Well nourished well developed, in no apparent distress.  Eyes: PERRLA, EOMs, conjunctiva no swelling or erythema, eyes wide set ENT/Mouth: Ear canals normal with cerument obstruction bilaterally, swelling, erythema, or discharge.  TMs normal bilaterally with no erythema, bulging, retraction, or loss of landmark.  Oropharynx moist and clear with no exudate,  erythema, or swelling.   Neck: Supple, thyroid normal. No bruits.  No cervical adenopathy Respiratory: Respiratory effort normal, Breath sounds clear A&P without wheeze, rhonchi, rales.   Cardio: RRR with 2+ murmur heard best on LSB, no rubs or gallops. Brisk peripheral pulses with 1+ edema. No warmth, no redness, no hard cord or discomfort.   Chest: symmetric, with normal excursions Abdomen: Soft, nontender, no guarding, rebound, hernias, masses, or organomegaly.  Lymphatics: Non tender without lymphadenopathy.  Musculoskeletal: Full ROM all peripheral extremities,5/5 strength, and normal gait. Scoliosis to the right present on examination.  No kyphosis Skin: Warm, dry without rashes, lesions, ecchymosis. Neuro: Awake and oriented X 3, Cranial nerves intact, reflexes equal bilaterally. Normal muscle tone, no cerebellar symptoms. Sensation intact.  Psych:  normal  affect for baseline, Insight and Judgment appropriate for patient.  Breasts  EKG: NSR   Elder Negus, NP 9:50 AM [facility]

## 2020-07-10 ENCOUNTER — Encounter: Payer: Self-pay | Admitting: Adult Health Nurse Practitioner

## 2020-07-10 ENCOUNTER — Other Ambulatory Visit: Payer: Self-pay

## 2020-07-10 ENCOUNTER — Ambulatory Visit: Payer: BC Managed Care – PPO | Admitting: Adult Health Nurse Practitioner

## 2020-07-10 VITALS — BP 124/80 | HR 68 | Temp 98.1°F | Ht <= 58 in | Wt 137.0 lb

## 2020-07-10 DIAGNOSIS — Z1389 Encounter for screening for other disorder: Secondary | ICD-10-CM | POA: Diagnosis not present

## 2020-07-10 DIAGNOSIS — E559 Vitamin D deficiency, unspecified: Secondary | ICD-10-CM | POA: Diagnosis not present

## 2020-07-10 DIAGNOSIS — Q6589 Other specified congenital deformities of hip: Secondary | ICD-10-CM

## 2020-07-10 DIAGNOSIS — Z1321 Encounter for screening for nutritional disorder: Secondary | ICD-10-CM

## 2020-07-10 DIAGNOSIS — K219 Gastro-esophageal reflux disease without esophagitis: Secondary | ICD-10-CM

## 2020-07-10 DIAGNOSIS — Z13220 Encounter for screening for lipoid disorders: Secondary | ICD-10-CM

## 2020-07-10 DIAGNOSIS — Z131 Encounter for screening for diabetes mellitus: Secondary | ICD-10-CM

## 2020-07-10 DIAGNOSIS — F79 Unspecified intellectual disabilities: Secondary | ICD-10-CM

## 2020-07-10 DIAGNOSIS — Z0001 Encounter for general adult medical examination with abnormal findings: Secondary | ICD-10-CM | POA: Diagnosis not present

## 2020-07-10 DIAGNOSIS — F419 Anxiety disorder, unspecified: Secondary | ICD-10-CM

## 2020-07-10 DIAGNOSIS — H9203 Otalgia, bilateral: Secondary | ICD-10-CM

## 2020-07-10 DIAGNOSIS — Z130 Encounter for screening for diseases of the blood and blood-forming organs and certain disorders involving the immune mechanism: Secondary | ICD-10-CM | POA: Diagnosis not present

## 2020-07-10 DIAGNOSIS — R7309 Other abnormal glucose: Secondary | ICD-10-CM

## 2020-07-10 DIAGNOSIS — R6889 Other general symptoms and signs: Secondary | ICD-10-CM

## 2020-07-10 DIAGNOSIS — H6123 Impacted cerumen, bilateral: Secondary | ICD-10-CM | POA: Diagnosis not present

## 2020-07-10 DIAGNOSIS — I10 Essential (primary) hypertension: Secondary | ICD-10-CM

## 2020-07-10 DIAGNOSIS — Z20822 Contact with and (suspected) exposure to covid-19: Secondary | ICD-10-CM

## 2020-07-10 DIAGNOSIS — Z79899 Other long term (current) drug therapy: Secondary | ICD-10-CM

## 2020-07-10 DIAGNOSIS — F988 Other specified behavioral and emotional disorders with onset usually occurring in childhood and adolescence: Secondary | ICD-10-CM

## 2020-07-10 DIAGNOSIS — Z136 Encounter for screening for cardiovascular disorders: Secondary | ICD-10-CM | POA: Diagnosis not present

## 2020-07-10 DIAGNOSIS — M47816 Spondylosis without myelopathy or radiculopathy, lumbar region: Secondary | ICD-10-CM

## 2020-07-10 DIAGNOSIS — E785 Hyperlipidemia, unspecified: Secondary | ICD-10-CM

## 2020-07-10 DIAGNOSIS — R6250 Unspecified lack of expected normal physiological development in childhood: Secondary | ICD-10-CM

## 2020-07-10 NOTE — Patient Instructions (Addendum)
We will respond with lab results in 1-3 days via MyChart.    Ear care: Both of your ears are clear of wax at this time Use 2-3 drops of oil in ear for next 3-4 nights.   You can use olive oil or mineral oil This will help to sooth your ear canals.  Use cotton ball to keep in ears while sleeping.  For regular maintenance: Use warm or room temp peroxide in ear once every 1-2 weeks, then apply oil that night x1.   Next morning let warm water from shower run into your ears.   This will help flush out any wax Do not use cotton swabs in your ears Should you have pain, decrease in your ability to hear or dizziness please contact the office for an appointment.   GENERAL HEALTH GOALS  Know what a healthy weight is for you (roughly BMI <25) and aim to maintain this  Aim for 7+ servings of fruits and vegetables daily  70-80+ fluid ounces of water or unsweet tea for healthy kidneys  Limit to max 1 drink of alcohol per day; avoid smoking/tobacco  Limit animal fats in diet for cholesterol and heart health - choose grass fed whenever available  Avoid highly processed foods, and foods high in saturated/trans fats  Aim for low stress - take time to unwind and care for your mental health  Aim for 150 min of moderate intensity exercise weekly for heart health, and weights twice weekly for bone health  Aim for 7-9 hours of sleep daily

## 2020-07-11 LAB — COMPLETE METABOLIC PANEL WITH GFR
AG Ratio: 2 (calc) (ref 1.0–2.5)
ALT: 11 U/L (ref 6–29)
AST: 21 U/L (ref 10–30)
Albumin: 4.7 g/dL (ref 3.6–5.1)
Alkaline phosphatase (APISO): 80 U/L (ref 31–125)
BUN: 19 mg/dL (ref 7–25)
CO2: 27 mmol/L (ref 20–32)
Calcium: 9.6 mg/dL (ref 8.6–10.2)
Chloride: 101 mmol/L (ref 98–110)
Creat: 0.61 mg/dL (ref 0.50–1.10)
GFR, Est African American: 130 mL/min/{1.73_m2} (ref >=60)
GFR, Est Non African American: 112 mL/min/{1.73_m2} (ref >=60)
Globulin: 2.4 g/dL (calc) (ref 1.9–3.7)
Glucose, Bld: 83 mg/dL (ref 65–99)
Potassium: 4.4 mmol/L (ref 3.5–5.3)
Sodium: 136 mmol/L (ref 135–146)
Total Bilirubin: 0.5 mg/dL (ref 0.2–1.2)
Total Protein: 7.1 g/dL (ref 6.1–8.1)

## 2020-07-11 LAB — NO CULTURE INDICATED

## 2020-07-11 LAB — URINALYSIS W MICROSCOPIC + REFLEX CULTURE
Bacteria, UA: NONE SEEN /HPF
Bilirubin Urine: NEGATIVE
Glucose, UA: NEGATIVE
Hgb urine dipstick: NEGATIVE
Hyaline Cast: NONE SEEN /LPF
Ketones, ur: NEGATIVE
Leukocyte Esterase: NEGATIVE
Nitrites, Initial: NEGATIVE
Protein, ur: NEGATIVE
RBC / HPF: NONE SEEN /HPF (ref 0–2)
Specific Gravity, Urine: 1.011 (ref 1.001–1.03)
Squamous Epithelial / HPF: NONE SEEN /HPF (ref <=5)
WBC, UA: NONE SEEN /HPF (ref 0–5)
pH: 7 (ref 5.0–8.0)

## 2020-07-11 LAB — LIPID PANEL
Cholesterol: 243 mg/dL — ABNORMAL HIGH (ref <200)
HDL: 74 mg/dL (ref >=50)
LDL Cholesterol (Calc): 148 mg/dL (calc) — ABNORMAL HIGH
Non-HDL Cholesterol (Calc): 169 mg/dL (calc) — ABNORMAL HIGH (ref <130)
Total CHOL/HDL Ratio: 3.3 (calc) (ref <5.0)
Triglycerides: 97 mg/dL (ref <150)

## 2020-07-11 LAB — CBC WITH DIFFERENTIAL/PLATELET
Absolute Monocytes: 490 cells/uL (ref 200–950)
Basophils Absolute: 43 cells/uL (ref 0–200)
Basophils Relative: 0.6 %
Eosinophils Absolute: 291 cells/uL (ref 15–500)
Eosinophils Relative: 4.1 %
HCT: 44.2 % (ref 35.0–45.0)
Hemoglobin: 14.1 g/dL (ref 11.7–15.5)
Lymphs Abs: 1349 cells/uL (ref 850–3900)
MCH: 30.1 pg (ref 27.0–33.0)
MCHC: 31.9 g/dL — ABNORMAL LOW (ref 32.0–36.0)
MCV: 94.2 fL (ref 80.0–100.0)
MPV: 9.4 fL (ref 7.5–12.5)
Monocytes Relative: 6.9 %
Neutro Abs: 4927 cells/uL (ref 1500–7800)
Neutrophils Relative %: 69.4 %
Platelets: 325 10*3/uL (ref 140–400)
RBC: 4.69 10*6/uL (ref 3.80–5.10)
RDW: 11.4 % (ref 11.0–15.0)
Total Lymphocyte: 19 %
WBC: 7.1 10*3/uL (ref 3.8–10.8)

## 2020-07-11 LAB — IRON, TOTAL/TOTAL IRON BINDING CAP
%SAT: 35 % (calc) (ref 16–45)
Iron: 122 ug/dL (ref 40–190)
TIBC: 351 mcg/dL (calc) (ref 250–450)

## 2020-07-11 LAB — TSH: TSH: 0.88 mIU/L

## 2020-07-11 LAB — HEMOGLOBIN A1C
Hgb A1c MFr Bld: 5.6 % of total Hgb (ref <5.7)
Mean Plasma Glucose: 114 mg/dL
eAG (mmol/L): 6.3 mmol/L

## 2020-07-11 LAB — VITAMIN B12: Vitamin B-12: 833 pg/mL (ref 200–1100)

## 2020-07-11 LAB — VITAMIN D 25 HYDROXY (VIT D DEFICIENCY, FRACTURES): Vit D, 25-Hydroxy: 43 ng/mL (ref 30–100)

## 2020-07-15 LAB — SARS-COV-2 SEMI-QUANTITATIVE TOTAL ANTIBODY, SPIKE: SARS COV2 AB, Total Spike Semi QN: 495 U/mL — ABNORMAL HIGH (ref <0.8)

## 2020-08-01 DIAGNOSIS — G8929 Other chronic pain: Secondary | ICD-10-CM | POA: Insufficient documentation

## 2020-08-01 DIAGNOSIS — M545 Low back pain, unspecified: Secondary | ICD-10-CM | POA: Insufficient documentation

## 2020-09-09 ENCOUNTER — Other Ambulatory Visit: Payer: Self-pay

## 2020-09-09 ENCOUNTER — Ambulatory Visit (INDEPENDENT_AMBULATORY_CARE_PROVIDER_SITE_OTHER): Payer: Managed Care, Other (non HMO) | Admitting: Podiatry

## 2020-09-09 ENCOUNTER — Encounter: Payer: Self-pay | Admitting: Podiatry

## 2020-09-09 DIAGNOSIS — B351 Tinea unguium: Secondary | ICD-10-CM | POA: Diagnosis not present

## 2020-09-09 DIAGNOSIS — M79675 Pain in left toe(s): Secondary | ICD-10-CM | POA: Diagnosis not present

## 2020-09-09 DIAGNOSIS — M79674 Pain in right toe(s): Secondary | ICD-10-CM | POA: Diagnosis not present

## 2020-09-09 DIAGNOSIS — Q828 Other specified congenital malformations of skin: Secondary | ICD-10-CM

## 2020-09-09 DIAGNOSIS — M79672 Pain in left foot: Secondary | ICD-10-CM

## 2020-09-14 NOTE — Progress Notes (Signed)
Subjective:  Patient ID: Cathy Howard, female    DOB: [birth year],  MRN: [id number]  45 y.o. female presents painful porokeratotic lesion(s) left foot and painful mycotic toenails that limit ambulation. Painful toenails interfere with ambulation. Aggravating factors include wearing enclosed shoe gear. Pain is relieved with periodic professional debridement. Painful porokeratotic lesions are aggravated when weightbearing with and without shoegear. Pain is relieved with periodic professional debridement..  Pain interferes with ambulation. Aggravating factors include wearing enclosed shoe gear. Pain is relieved with periodic professional debridement.   Current Outpatient Medications:  .  acetaminophen (TYLENOL) 500 MG tablet, Take by mouth., Disp: , Rfl:  .  amoxicillin (AMOXIL) 500 MG capsule, SMARTSIG:4 Capsule(s) By Mouth Once (Patient not taking: Reported on 07/10/2020), Disp: , Rfl:  .  amphetamine-dextroamphetamine (ADDERALL XR) 15 MG 24 hr capsule, Take by mouth., Disp: , Rfl:  .  aspirin EC 81 MG tablet, Take by mouth once. , Disp: , Rfl:  .  cetirizine (ZYRTEC) 10 MG tablet, Take by mouth., Disp: , Rfl:  .  Cholecalciferol (VITAMIN D3) 125 MCG (5000 UT) TABS, Take by mouth., Disp: , Rfl:  .  FLUoxetine (PROZAC) 20 MG capsule, Take 60 mg by mouth every morning., Disp: , Rfl: 5 .  Lactobacillus Rhamnosus, GG, (RA PROBIOTIC DIGESTIVE CARE) CAPS, Take by mouth., Disp: , Rfl:  .  Magnesium 250 MG TABS, Take by mouth daily., Disp: , Rfl:  .  meloxicam (MOBIC) 7.5 MG tablet, TAKE 1 TAB BY MOUTH 2X DAILY AS NEEDED FOR PAIN (OKAY TO TAKE WITH TYLENOL, NOT ALEVE, IBUPROFEN)., Disp: 60 tablet, Rfl: 5 .  Multiple Vitamin (MULTIVITAMIN) tablet, Take 1 tablet by mouth daily., Disp: , Rfl:  .  mupirocin ointment (BACTROBAN) 2 %, PLEASE SEE ATTACHED FOR DETAILED DIRECTIONS (Patient not taking: Reported on 07/10/2020), Disp: , Rfl:  .  topiramate (TOPAMAX) 100 MG tablet, Take 100 mg by mouth 2 (two) times  daily., Disp: , Rfl:  .  vitamin C (ASCORBIC ACID) 500 MG tablet, Take 500 mg by mouth daily., Disp: , Rfl:   Allergies  Allergen Reactions  . Fentanyl Other (See Comments)    O2 decreases  . Maxitrol [Neomycin-Polymyxin-Dexameth]   . Neomycin-Bacitracin Zn-Polymyx Swelling  . Quetiapine Other (See Comments)    Over sedation     Review of Systems: Negative except as noted in the HPI.  Objective:   Constitutional Pt is a pleasant 45 y.o. Caucasian female in NAD. AAO x 3.   Vascular Capillary refill time to digits immediate b/l. Palpable pedal pulses b/l LE. Pedal hair present. Lower extremity skin temperature gradient within normal limits.  Neurologic Patient unable to follow commands of LE neurological examination due to cognitive deficits. Patient does respond to external noxious stimuli.  Dermatologic Pedal skin with normal turgor, texture and tone bilaterally. No open wounds bilaterally. No interdigital macerations bilaterally. Toenails 1-5 b/l elongated, discolored, dystrophic, thickened, crumbly with subungual debris and tenderness to dorsal palpation. Porokeratotic lesion(s) submet head 2 left foot and plantar aspect of heel left lower extremity. No erythema, no edema, no drainage, no fluctuance.  Orthopedic: Normal muscle strength 5/5 to all lower extremity muscle groups bilaterally. Hallux valgus with bunion deformity noted b/l lower extremities. Hammertoes noted to the b/l lower extremities. Pes planus deformity noted b/l.    Radiographs: None Assessment:   1. Pain due to onychomycosis of toenails of both feet   2. Porokeratosis   3. Pain in left foot    Plan:  Patient was evaluated and  treated and all questions answered.  Onychomycosis with pain -Nails palliatively debridement as below. -Educated on self-care  Procedure: Nail Debridement Rationale: Pain Type of Debridement: manual, sharp debridement. Instrumentation: Nail nipper, rotary burr. Number of Nails:  10  -Examined patient. -Patient to continue soft, supportive shoe gear daily. -Toenails 1-5 b/l were debrided in length and girth with sterile nail nippers and dremel without iatrogenic bleeding.  -Porokeratotic lesions pared and enucleated submet head 2 left and left heel utilizing sterile scalpel blade without incident.  -Patient to report any pedal injuries to medical professional immediately. -Patient/POA to call should there be question/concern in the interim.  Return in about 3 months (around 12/07/2020).  Freddie Breech, DPM

## 2020-10-15 LAB — HM MAMMOGRAPHY

## 2020-10-31 ENCOUNTER — Ambulatory Visit (INDEPENDENT_AMBULATORY_CARE_PROVIDER_SITE_OTHER): Payer: Managed Care, Other (non HMO) | Admitting: Adult Health

## 2020-10-31 ENCOUNTER — Other Ambulatory Visit: Payer: Self-pay

## 2020-10-31 ENCOUNTER — Encounter: Payer: Self-pay | Admitting: Adult Health

## 2020-10-31 VITALS — BP 122/76 | HR 73 | Temp 97.9°F | Wt 141.0 lb

## 2020-10-31 DIAGNOSIS — Z79899 Other long term (current) drug therapy: Secondary | ICD-10-CM

## 2020-10-31 DIAGNOSIS — F988 Other specified behavioral and emotional disorders with onset usually occurring in childhood and adolescence: Secondary | ICD-10-CM | POA: Diagnosis not present

## 2020-10-31 DIAGNOSIS — E669 Obesity, unspecified: Secondary | ICD-10-CM

## 2020-10-31 DIAGNOSIS — E785 Hyperlipidemia, unspecified: Secondary | ICD-10-CM | POA: Diagnosis not present

## 2020-10-31 DIAGNOSIS — F419 Anxiety disorder, unspecified: Secondary | ICD-10-CM | POA: Diagnosis not present

## 2020-10-31 DIAGNOSIS — E559 Vitamin D deficiency, unspecified: Secondary | ICD-10-CM

## 2020-10-31 DIAGNOSIS — J302 Other seasonal allergic rhinitis: Secondary | ICD-10-CM | POA: Diagnosis not present

## 2020-10-31 MED ORDER — PROMETHAZINE-DM 6.25-15 MG/5ML PO SYRP: 5.0000 mL | ORAL_SOLUTION | Freq: Four times a day (QID) | ORAL | 1 refills | Status: DC | PRN

## 2020-10-31 MED ORDER — PREDNISONE 20 MG PO TABS: ORAL_TABLET | ORAL | 0 refills | Status: DC

## 2020-10-31 NOTE — Progress Notes (Signed)
 with acute visit   Assessment and Plan:  Cholesterol Currently moderate elevations without medication Strong preference to work on lifestyle Continue low cholesterol diet and exercise.  Check lipid panel.   Other abnormal glucose Recent A1Cs at goal Discussed diet/exercise, weight management  Defer A1C; check CMP  Obesity with co morbidities Long discussion about weight loss, diet, and exercise Recommended diet heavy in fruits and veggies and low in animal meats, cheeses, and dairy products, appropriate calorie intake Discussed ideal weight for height  Will follow up in 3 months  Vitamin D Def Below goal at last visit; she has started on supplementation since last check Check Vit D level  ADD Continue medications Helps with focus, no AE's. The patient was counseled on the addictive nature of the medication and was encouraged to take drug holidays when not needed.   Anxiety Well managed by current regimen; continue medications Stress management techniques discussed, increase water, good sleep hygiene discussed, increase exercise, and increase veggies.   Allergic rhinitis Possible very mild sinusitis Discussed the importance of avoiding unnecessary antibiotic therapy. Suggested symptomatic OTC remedies. Continue decongestant rotate allergy pill, oral steroids offered as cannot tolerate nasal sprays/irrigation Follow up as needed.   Further disposition pending results of labs. Discussed med's effects and SE's.   Over 30 minutes of exam, counseling, chart review, and critical decision making was performed.   Future Appointments  Date Time Provider Department Center  11/06/2020  9:30 AM Judd Gaudier, NP [facility] None  12/17/2020  8:30 AM Freddie Breech, DPM [facility]  07/10/2021  9:00 AM Elder Negus, NP [facility] None     ------------------------------------------------------------------------------------------------------------------   HPI 45 y.o.female with ID presents accompanied by her mother for URI. She had negative covid 19 rapid screen today, was vaccinated 2/2. Also scheduled for next week, will complete today.   Mother gives most history.   Reports intermittent ongoing typical allergy sx, on zyrtec; last week 6 days ago she woke up reporting frontal headache/sinus pressure, tenderness; was running low grade fever until yesterday, has added sudafed PE and tylenol and does seem to be doing better today.    BMI is Body mass index is 31.61 kg/m., she has been working on diet and exercise, 30-40 min walking 2-3 days a week in the AM, working up.  Wt Readings from Last 3 Encounters:  10/31/20 141 lb (64 kg)  07/10/20 137 lb (62.1 kg)  06/17/20 138 lb 6.4 oz (62.8 kg)   She is managed by psych NP Brock Bad.  She is on topamax for anxiety. Patient is on an ADD medication (adderall), she states that the medication is helping and she denies any adverse reactions. Takes 15 mg adderall once daily in the mornings. She has a diagnosis of anxiety and is currently on prozac 20 mg daily and has lorazepam but NEVER takes it reports symptoms are well controlled on current regimen.   Her blood pressure has been controlled at home, today their BP is BP: 122/76  She does workout. She denies chest pain, shortness of breath, dizziness.   She is not on cholesterol medication and denies myalgias. Her cholesterol is not at goal. The cholesterol last visit was:   Lab Results  Component Value Date   CHOL 243 (H) 07/10/2020   HDL 74 07/10/2020   LDLCALC 148 (H) 07/10/2020   TRIG 97 07/10/2020   CHOLHDL 3.3 07/10/2020    She has been working on diet and exercise for glucose management, and denies  increased appetite, nausea, paresthesia of the feet, polydipsia and polyuria. Last A1C in the office was:   Lab Results  Component Value Date   HGBA1C 5.6 07/10/2020   Patient is on Vitamin D supplement.   Lab Results  Component Value Date   VD25OH 43 07/10/2020       Immunization History  Administered Date(s) Administered  . Influenza Inj Mdck Quad With Preservative 06/03/2017, 06/06/2018, 05/06/2020  . Influenza Split 04/15/2015  . Influenza,inj,Quad PF,6+ Mos 04/11/2019  . Influenza,inj,quad, With Preservative 04/14/2016  . Influenza-Unspecified 05/24/2014  . PFIZER(Purple Top)SARS-COV-2 Vaccination 10/04/2019, 10/25/2019  . PPD Test 02/19/2014  . Tdap 02/07/2013     Past Medical History:  Diagnosis Date  . ADD (attention deficit disorder)   . Hyperlipidemia   . Mental retardation   . OCD (obsessive compulsive disorder)   . Unspecified vitamin D deficiency      Allergies  Allergen Reactions  . Fentanyl Other (See Comments)    O2 decreases  . Maxitrol [Neomycin-Polymyxin-Dexameth]   . Neomycin-Bacitracin Zn-Polymyx Swelling  . Quetiapine Other (See Comments)    Over sedation     Current Outpatient Medications on File Prior to Visit  Medication Sig  . acetaminophen (TYLENOL) 500 MG tablet Take by mouth.  Marland Kitchen amoxicillin (AMOXIL) 500 MG capsule SMARTSIG:4 Capsule(s) By Mouth Once (Patient not taking: Reported on 07/10/2020)  . amphetamine-dextroamphetamine (ADDERALL XR) 15 MG 24 hr capsule Take by mouth.  Marland Kitchen aspirin EC 81 MG tablet Take by mouth once.   . cetirizine (ZYRTEC) 10 MG tablet Take by mouth.  . Cholecalciferol (VITAMIN D3) 125 MCG (5000 UT) TABS Take by mouth.  Marland Kitchen FLUoxetine (PROZAC) 20 MG capsule Take 60 mg by mouth every morning.  . Lactobacillus Rhamnosus, GG, (RA PROBIOTIC DIGESTIVE CARE) CAPS Take by mouth.  . Magnesium 250 MG TABS Take by mouth daily.  . meloxicam (MOBIC) 7.5 MG tablet TAKE 1 TAB BY MOUTH 2X DAILY AS NEEDED FOR PAIN (OKAY TO TAKE WITH TYLENOL, NOT ALEVE, IBUPROFEN).  . Multiple Vitamin (MULTIVITAMIN) tablet Take 1 tablet by mouth daily.   . mupirocin ointment (BACTROBAN) 2 % PLEASE SEE ATTACHED FOR DETAILED DIRECTIONS (Patient not taking: Reported on 07/10/2020)  . topiramate (TOPAMAX) 100 MG tablet Take 100 mg by mouth 2 (two) times daily.  . vitamin C (ASCORBIC ACID) 500 MG tablet Take 500 mg by mouth daily.   No current facility-administered medications on file prior to visit.    ROS: all negative except above.   Physical Exam:  Pulse 73   Temp 97.9 F (36.6 C)   Wt 141 lb (64 kg)   SpO2 97%   BMI 31.61 kg/m   General Appearance: Well nourished well developed, in no apparent distress.  Eyes: PERRLA, EOMs, conjunctiva no swelling or erythema, eyes wide set ENT/Mouth: Ear canals normal with cerument obstruction bilaterally, swelling, erythema, or discharge.  TMs normal bilaterally with no erythema, bulging, retraction, or loss of landmark.  Oropharynx moist and clear with no exudate, erythema, or swelling.   Neck: Supple, thyroid normal. No bruits.  No cervical adenopathy Respiratory: Respiratory effort normal, Breath sounds clear A&P without wheeze, rhonchi, rales.   Cardio: RRR without murmur, no rubs or gallops. Brisk peripheral pulses with 1+ edema. No warmth, no redness, no hard cord or discomfort.   Abdomen: Soft, nontender, no guarding  Lymphatics: Non tender without lymphadenopathy.  Musculoskeletal: Full ROM all peripheral extremities,5/5 strength,  Scoliosis to the right present on examination.  No kyphosis. Slow waddling gait.  Skin: Warm, dry without rashes, lesions, ecchymosis. Neuro: Awake and oriented X 2, Normal muscle tone,  Sensation intact.  Psych:  normal affect for baseline, Insight and Judgment appropriate for patient.     Dan Maker, NP 9:00 AM [facility]

## 2020-10-31 NOTE — Patient Instructions (Addendum)
Medicines you can use  Nasal congestion  Little Remedies saline spray (aerosol/mist)- can try this, it is in the kids section - pseudoephedrine (Sudafed)- behind the counter, do not use if you have high blood pressure, medicine that have -D in them.  - phenylephrine (Sudafed PE) -Dextormethorphan + chlorpheniramine (Coridcidin HBP)- okay if you have high blood pressure -Oxymetazoline (Afrin) nasal spray- LIMIT to 3 days -Saline nasal spray -Neti pot (used distilled or bottled water)  Ear pain/congestion  -pseudoephedrine (sudafed) - Nasonex/flonase nasal spray  Fever  -Acetaminophen (Tyelnol) -Ibuprofen (Advil, motrin, aleve)  Sore Throat  -Acetaminophen (Tyelnol) -Ibuprofen (Advil, motrin, aleve) -Drink a lot of water -Gargle with salt water - Rest your voice (don't talk) -Throat sprays -Cough drops  Body Aches  -Acetaminophen (Tyelnol) -Ibuprofen (Advil, motrin, aleve)  Headache  -Acetaminophen (Tyelnol) -Ibuprofen (Advil, motrin, aleve) - Exedrin, Exedrin Migraine  Allergy symptoms (cough, sneeze, runny nose, itchy eyes) -Claritin or loratadine cheapest but likely the weakest  -Zyrtec or certizine at night because it can make you sleepy -The strongest is allegra or fexafinadine  Cheapest at [employer], [employer], [employer]  Cough  -Dextromethorphan (Delsym)- medicine that has DM in it -Guafenesin (Mucinex/Robitussin) - cough drops - drink lots of water  Chest Congestion  -Guafenesin (Mucinex/Robitussin)  Red Itchy Eyes  - Naphcon-A  Upset Stomach  - Bland diet (nothing spicy, greasy, fried, and high acid foods like tomatoes, oranges, berries) -OKAY- cereal, bread, soup, crackers, rice -Eat smaller more frequent meals -reduce caffeine, no alcohol -Loperamide (Imodium-AD) if diarrhea -Prevacid for heart burn  General health when sick  -Hydration -wash your hands frequently -keep surfaces clean -change pillow cases and sheets often -Get fresh air but do  not exercise strenuously -Vitamin D, double up on it - Vitamin C -Zinc           High-Fiber Eating Plan Fiber, also called dietary fiber, is a type of carbohydrate. It is found foods such as fruits, vegetables, whole grains, and beans. A high-fiber diet can have many health benefits. Your health care provider may recommend a high-fiber diet to help:  Prevent constipation. Fiber can make your bowel movements more regular.  Lower your cholesterol.  Relieve the following conditions: ? Inflammation of veins in the anus (hemorrhoids). ? Inflammation of specific areas of the digestive tract (uncomplicated diverticulosis). ? A problem of the large intestine, also called the colon, that sometimes causes pain and diarrhea (irritable bowel syndrome, or IBS).  Prevent overeating as part of a weight-loss plan.  Prevent heart disease, type 2 diabetes, and certain cancers. What are tips for following this plan? Reading food labels  Check the nutrition facts label on food products for the amount of dietary fiber. Choose foods that have 5 grams of fiber or more per serving.  The goals for recommended daily fiber intake include: ? Men (age 41 or younger): 34-38 g. ? Men (over age 56): 28-34 g. ? Women (age 51 or younger): 25-28 g. ? Women (over age 14): 22-25 g. Your daily fiber goal is _____________ g.   Shopping  Choose whole fruits and vegetables instead of processed forms, such as apple juice or applesauce.  Choose a wide variety of high-fiber foods such as avocados, lentils, oats, and kidney beans.  Read the nutrition facts label of the foods you choose. Be aware of foods with added fiber. These foods often have high sugar and sodium amounts per serving. Cooking  Use whole-grain flour for baking and cooking.  Cook with brown  rice instead of white rice. Meal planning  Start the day with a breakfast that is high in fiber, such as a cereal that contains 5 g of fiber or more per  serving.  Eat breads and cereals that are made with whole-grain flour instead of refined flour or white flour.  Eat brown rice, bulgur wheat, or millet instead of white rice.  Use beans in place of meat in soups, salads, and pasta dishes.  Be sure that half of the grains you eat each day are whole grains. General information  You can get the recommended daily intake of dietary fiber by: ? Eating a variety of fruits, vegetables, grains, nuts, and beans. ? Taking a fiber supplement if you are not able to take in enough fiber in your diet. It is better to get fiber through food than from a supplement.  Gradually increase how much fiber you consume. If you increase your intake of dietary fiber too quickly, you may have bloating, cramping, or gas.  Drink plenty of water to help you digest fiber.  Choose high-fiber snacks, such as berries, raw vegetables, nuts, and popcorn. What foods should I eat? Fruits Berries. Pears. Apples. Oranges. Avocado. Prunes and raisins. Dried figs. Vegetables Sweet potatoes. Spinach. Kale. Artichokes. Cabbage. Broccoli. Cauliflower. Green peas. Carrots. Squash. Grains Whole-grain breads. Multigrain cereal. Oats and oatmeal. Brown rice. Barley. Bulgur wheat. Millet. Quinoa. Bran muffins. Popcorn. Rye wafer crackers. Meats and other proteins Navy beans, kidney beans, and pinto beans. Soybeans. Split peas. Lentils. Nuts and seeds. Dairy Fiber-fortified yogurt. Beverages Fiber-fortified soy milk. Fiber-fortified orange juice. Other foods Fiber bars. The items listed above may not be a complete list of recommended foods and beverages. Contact a dietitian for more information. What foods should I avoid? Fruits Fruit juice. Cooked, strained fruit. Vegetables Fried potatoes. Canned vegetables. Well-cooked vegetables. Grains White bread. Pasta made with refined flour. White rice. Meats and other proteins Fatty cuts of meat. Fried chicken or fried  fish. Dairy Milk. Yogurt. Cream cheese. Sour cream. Fats and oils Butters. Beverages Soft drinks. Other foods Cakes and pastries. The items listed above may not be a complete list of foods and beverages to avoid. Talk with your dietitian about what choices are best for you. Summary  Fiber is a type of carbohydrate. It is found in foods such as fruits, vegetables, whole grains, and beans.  A high-fiber diet has many benefits. It can help to prevent constipation, lower blood cholesterol, aid weight loss, and reduce your risk of heart disease, diabetes, and certain cancers.  Increase your intake of fiber gradually. Increasing fiber too quickly may cause cramping, bloating, and gas. Drink plenty of water while you increase the amount of fiber you consume.  The best sources of fiber include whole fruits and vegetables, whole grains, nuts, seeds, and beans. This information is not intended to replace advice given to you by your health care provider. Make sure you discuss any questions you have with your health care provider. Document Revised: 11/09/2019 Document Reviewed: 11/09/2019 Elsevier Patient Education  2021 [employer].

## 2020-11-01 LAB — CBC WITH DIFFERENTIAL/PLATELET
Absolute Monocytes: 408 cells/uL (ref 200–950)
Basophils Absolute: 31 cells/uL (ref 0–200)
Basophils Relative: 0.6 %
Eosinophils Absolute: 138 cells/uL (ref 15–500)
Eosinophils Relative: 2.7 %
HCT: 41.7 % (ref 35.0–45.0)
Hemoglobin: 13.4 g/dL (ref 11.7–15.5)
Lymphs Abs: 1443 cells/uL (ref 850–3900)
MCH: 30.6 pg (ref 27.0–33.0)
MCHC: 32.1 g/dL (ref 32.0–36.0)
MCV: 95.2 fL (ref 80.0–100.0)
MPV: 9.2 fL (ref 7.5–12.5)
Monocytes Relative: 8 %
Neutro Abs: 3080 cells/uL (ref 1500–7800)
Neutrophils Relative %: 60.4 %
Platelets: 301 10*3/uL (ref 140–400)
RBC: 4.38 10*6/uL (ref 3.80–5.10)
RDW: 11.1 % (ref 11.0–15.0)
Total Lymphocyte: 28.3 %
WBC: 5.1 10*3/uL (ref 3.8–10.8)

## 2020-11-01 LAB — COMPLETE METABOLIC PANEL WITH GFR
AG Ratio: 1.7 (calc) (ref 1.0–2.5)
ALT: 12 U/L (ref 6–29)
AST: 20 U/L (ref 10–30)
Albumin: 4.5 g/dL (ref 3.6–5.1)
Alkaline phosphatase (APISO): 83 U/L (ref 31–125)
BUN: 21 mg/dL (ref 7–25)
CO2: 26 mmol/L (ref 20–32)
Calcium: 9.7 mg/dL (ref 8.6–10.2)
Chloride: 101 mmol/L (ref 98–110)
Creat: 0.6 mg/dL (ref 0.50–1.10)
GFR, Est African American: 129 mL/min/{1.73_m2} (ref >=60)
GFR, Est Non African American: 112 mL/min/{1.73_m2} (ref >=60)
Globulin: 2.6 g/dL (calc) (ref 1.9–3.7)
Glucose, Bld: 81 mg/dL (ref 65–99)
Potassium: 4.3 mmol/L (ref 3.5–5.3)
Sodium: 138 mmol/L (ref 135–146)
Total Bilirubin: 0.3 mg/dL (ref 0.2–1.2)
Total Protein: 7.1 g/dL (ref 6.1–8.1)

## 2020-11-01 LAB — LIPID PANEL
Cholesterol: 214 mg/dL — ABNORMAL HIGH (ref <200)
HDL: 76 mg/dL (ref >=50)
LDL Cholesterol (Calc): 123 mg/dL (calc) — ABNORMAL HIGH
Non-HDL Cholesterol (Calc): 138 mg/dL (calc) — ABNORMAL HIGH (ref <130)
Total CHOL/HDL Ratio: 2.8 (calc) (ref <5.0)
Triglycerides: 60 mg/dL (ref <150)

## 2020-11-01 LAB — MAGNESIUM: Magnesium: 2 mg/dL (ref 1.5–2.5)

## 2020-11-01 LAB — TSH: TSH: 0.9 mIU/L

## 2020-11-06 ENCOUNTER — Ambulatory Visit: Payer: Managed Care, Other (non HMO) | Admitting: Adult Health

## 2020-12-12 IMAGING — MG MM DIGITAL DIAGNOSTIC UNILAT*L* W/ TOMO W/ CAD
4 series · 4 of 12 positions shown · non-contrast
Comparison: Previous exam(s).

CLINICAL DATA: 44-year-old female for further evaluation of
possible LEFT breast asymmetry on baseline screening mammogram.

EXAM:
DIGITAL DIAGNOSTIC UNILATERAL LEFT MAMMOGRAM WITH CAD AND TOMO

[L MLO synth-2D]
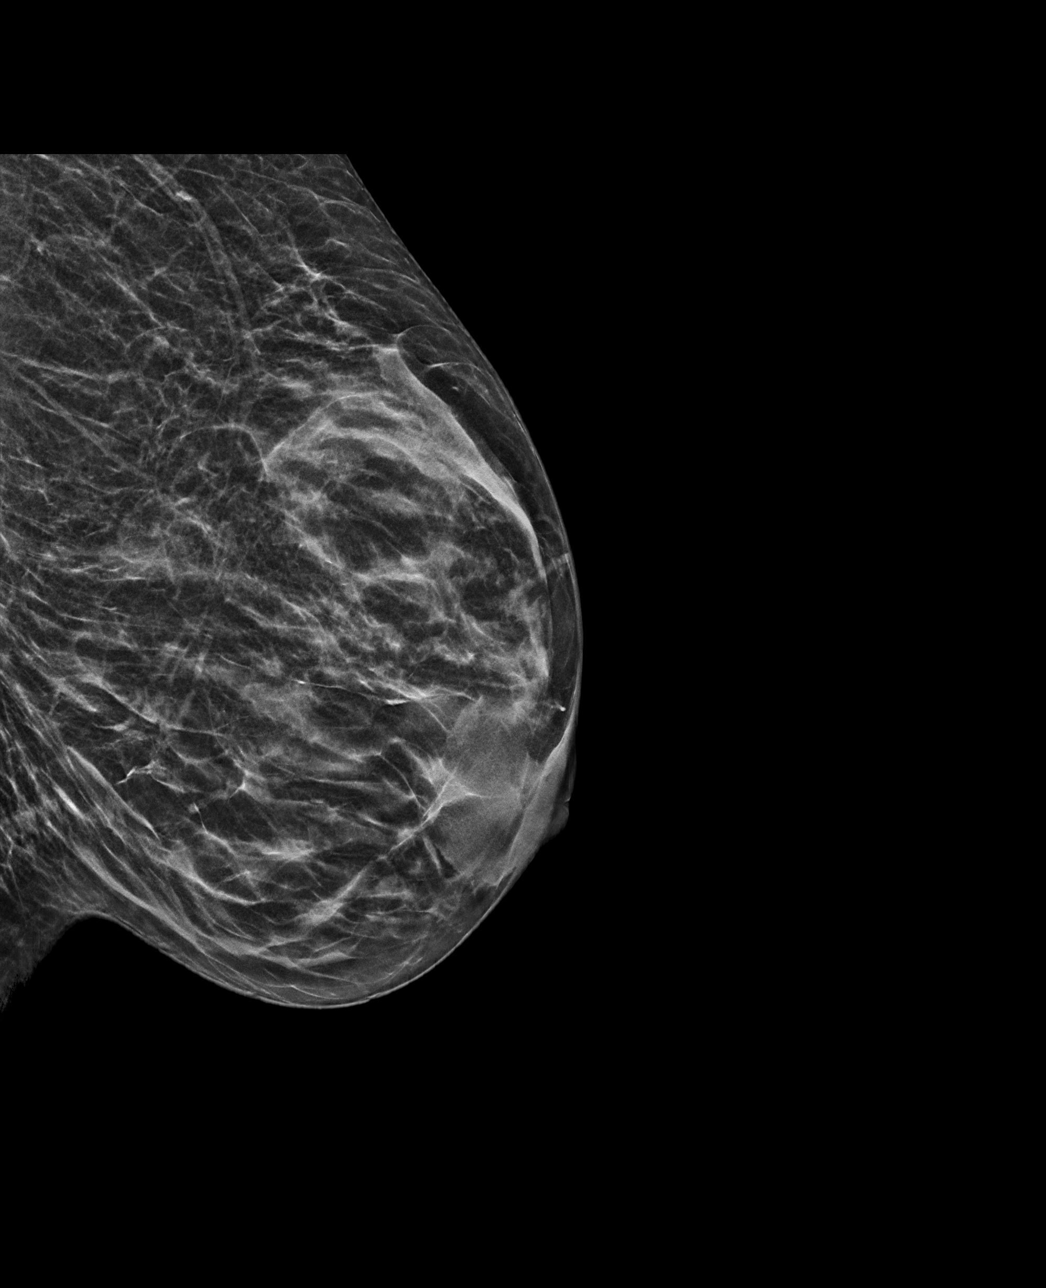

[L CC synth-2D]
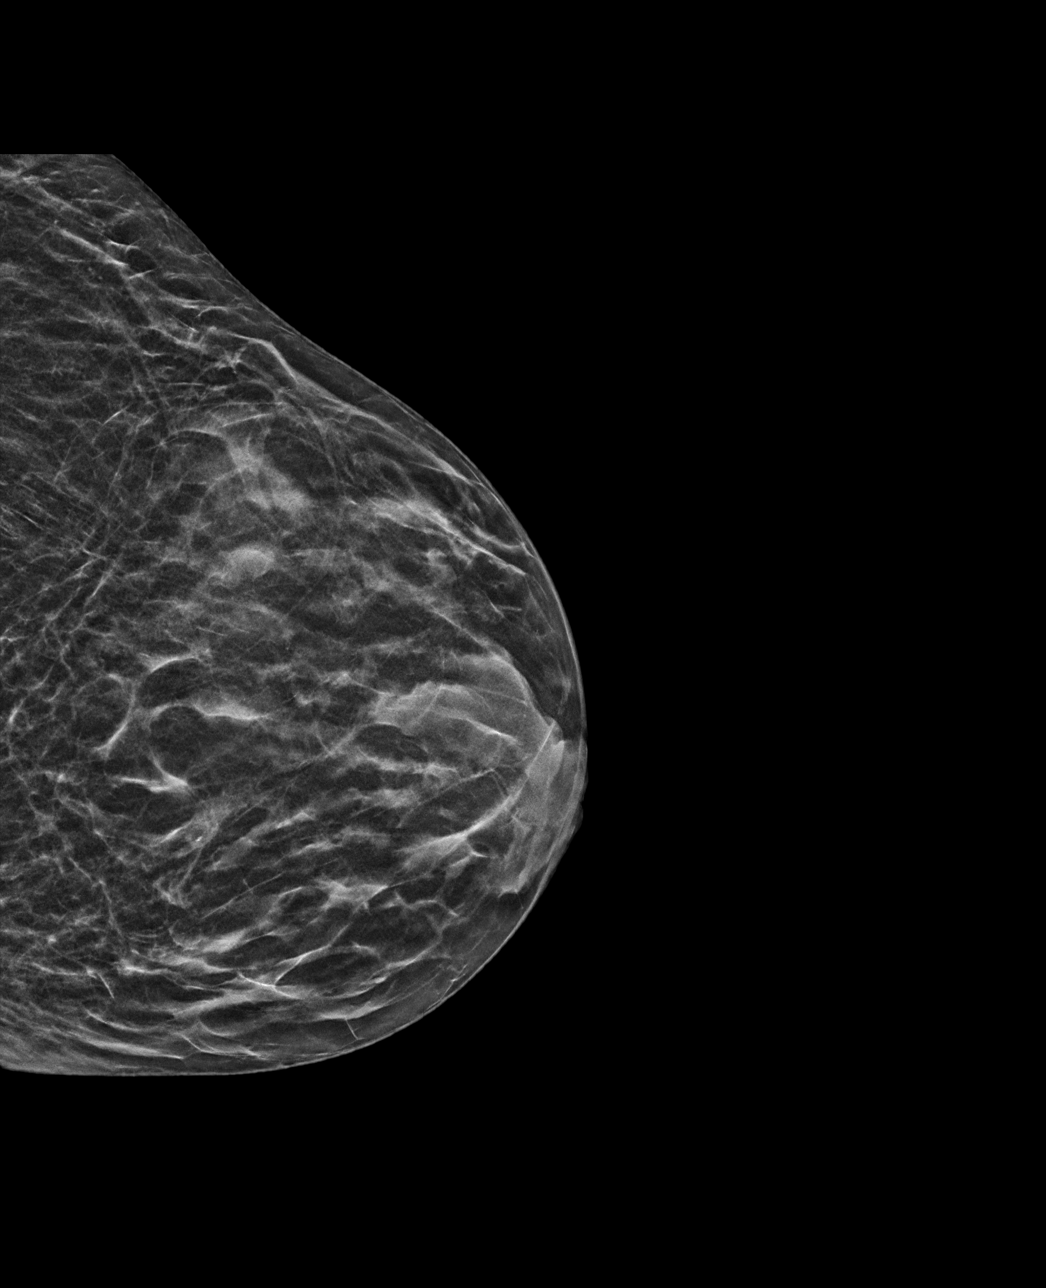

[L CC tomo · tomo slice 19/38.0]
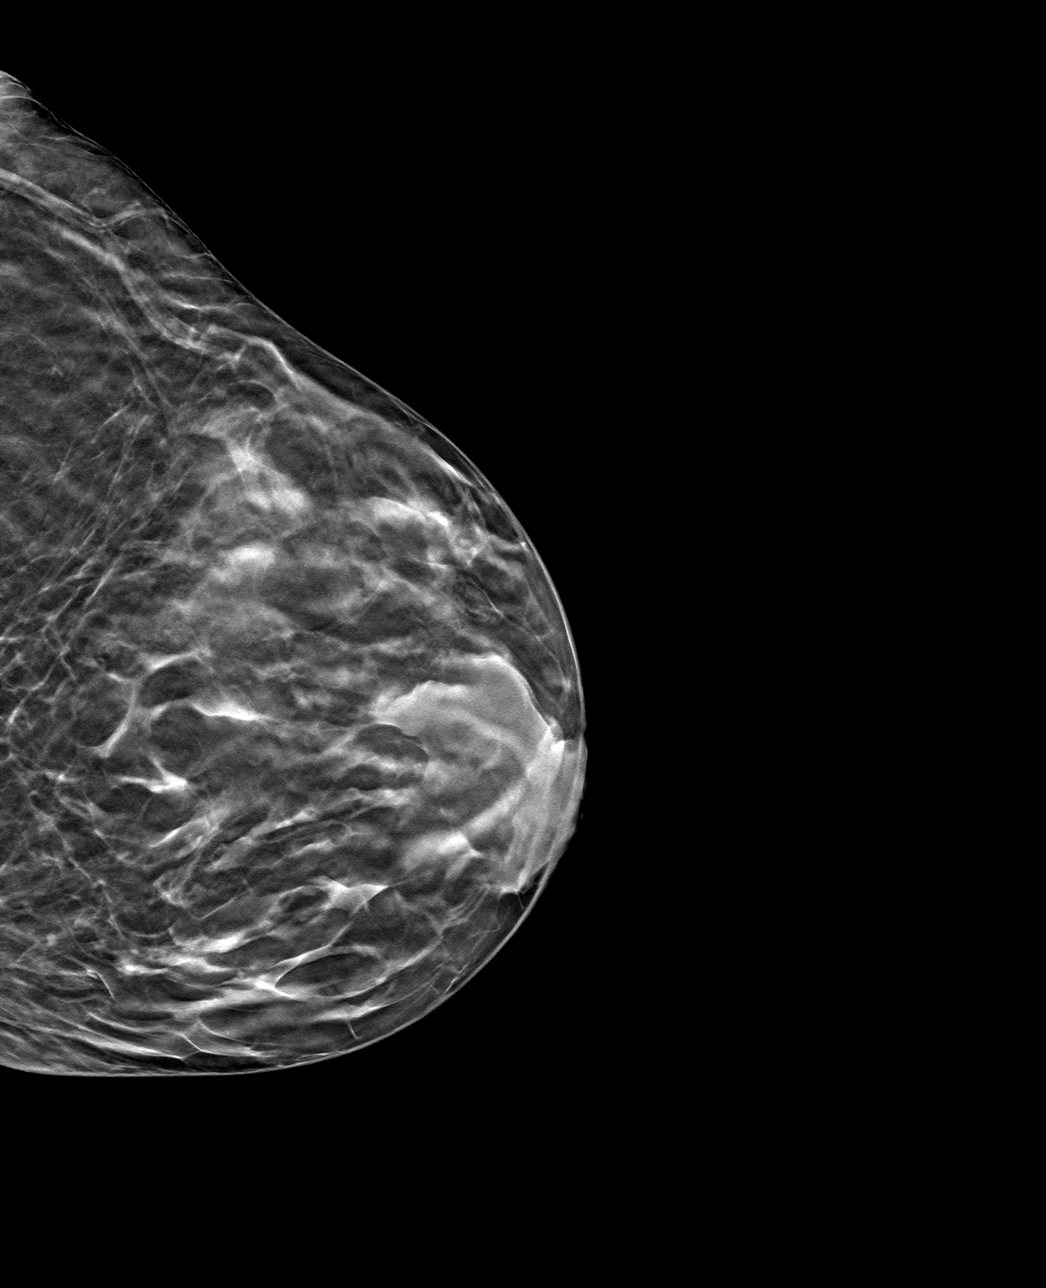

[L MLO tomo · tomo slice 21/40.0]
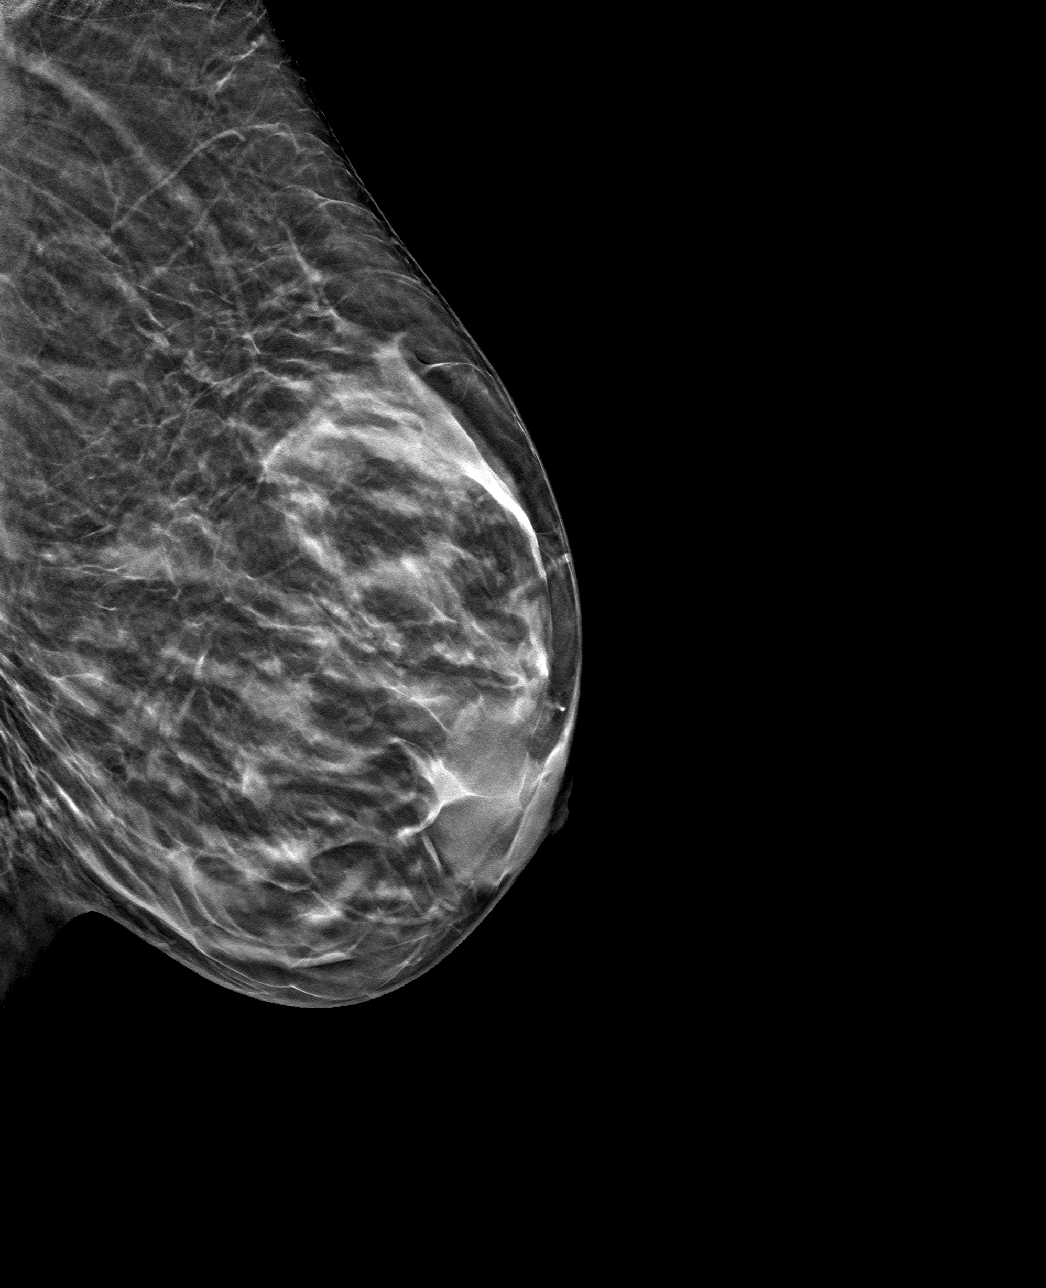

[4 of 12 positions shown; findings below may reference images not displayed]

ACR Breast Density Category c: The breast tissue is heterogeneously
dense, which may obscure small masses.
FINDINGS: 2D/3D full field views of the LEFT breast demonstrate no persistent
abnormality at the site of the screening study finding. No
suspicious mammographic findings are noted.

Mammographic images were processed with CAD.
IMPRESSION: No persistent abnormality at the site of the screening study
finding.

RECOMMENDATION:
Bilateral screening mammograms as clinically indicated.

I have discussed the findings and recommendations with the patient.
If applicable, a reminder letter will be sent to the patient
regarding the next appointment.

BI-RADS CATEGORY  1: Negative.

## 2020-12-17 ENCOUNTER — Encounter: Payer: Self-pay | Admitting: Podiatry

## 2020-12-17 ENCOUNTER — Other Ambulatory Visit: Payer: Self-pay

## 2020-12-17 ENCOUNTER — Ambulatory Visit (INDEPENDENT_AMBULATORY_CARE_PROVIDER_SITE_OTHER): Payer: Managed Care, Other (non HMO) | Admitting: Podiatry

## 2020-12-17 DIAGNOSIS — Q828 Other specified congenital malformations of skin: Secondary | ICD-10-CM

## 2020-12-17 DIAGNOSIS — B351 Tinea unguium: Secondary | ICD-10-CM

## 2020-12-17 DIAGNOSIS — M79674 Pain in right toe(s): Secondary | ICD-10-CM | POA: Diagnosis not present

## 2020-12-17 DIAGNOSIS — M79675 Pain in left toe(s): Secondary | ICD-10-CM | POA: Diagnosis not present

## 2020-12-17 DIAGNOSIS — M79672 Pain in left foot: Secondary | ICD-10-CM | POA: Diagnosis not present

## 2020-12-17 NOTE — Progress Notes (Signed)
  Subjective:  Patient ID: Cathy Howard, female    DOB: 12-06-1975,  MRN: [id number]  Cathy Howard presents to clinic today for painful porokeratotic lesion(s) left foot and painful mycotic toenails that limit ambulation. Painful toenails interfere with ambulation. Aggravating factors include wearing enclosed shoe gear. Pain is relieved with periodic professional debridement. Painful porokeratotic lesions are aggravated when weightbearing with and without shoegear. Pain is relieved with periodic professional debridement.   Her mother is present during today's visit. She voices no new pedal problems on today's visit.  PCP is Dr. Lucky Cowboy.  Allergies  Allergen Reactions  . Fentanyl Other (See Comments)    O2 decreases  . Maxitrol [Neomycin-Polymyxin-Dexameth]   . Neomycin-Bacitracin Zn-Polymyx Swelling  . Quetiapine Other (See Comments)    Over sedation     Review of Systems: Negative except as noted in the HPI. Objective:   Constitutional Cathy Howard is a pleasant 45 y.o. Caucasian female, WD, WN in NAD. AAO x 3.   Vascular Capillary refill time to digits immediate b/l. Palpable pedal pulses b/l LE. Pedal hair present. Lower extremity skin temperature gradient within normal limits. No cyanosis or clubbing noted.  Neurologic Normal speech. Oriented to person, place, and time. Patient unable to follow commands of LE neurological examination due to cognitive deficits. Patient does respond to external noxious stimuli.  Dermatologic Pedal skin with normal turgor, texture and tone bilaterally. No open wounds bilaterally. No interdigital macerations bilaterally. Toenails 1-5 b/l elongated, discolored, dystrophic, thickened, crumbly with subungual debris and tenderness to dorsal palpation. Porokeratotic lesion(s) submet head 2 left foot and plantar aspect of heel left lower extremity. No erythema, no edema, no drainage, no fluctuance.  Orthopedic: Normal muscle strength 5/5 to all lower  extremity muscle groups bilaterally. No pain crepitus or joint limitation noted with ROM b/l. Hallux valgus with bunion deformity noted b/l lower extremities. Hammertoe(s) noted to the b/l lower extremities. Pes planus deformity noted b/l.    Radiographs: None Assessment:   1. Pain due to onychomycosis of toenails of both feet   2. Porokeratosis   3. Pain in left foot    Plan:  Patient was evaluated and treated and all questions answered. -Examined patient. -No new findings. No new orders. -Patient to continue soft, supportive shoe gear daily. -Toenails 1-5 b/l were debrided in length and girth with sterile nail nippers and dremel without iatrogenic bleeding.  -Painful porokeratotic lesion(s) submet head 2 left foot and plantar aspect of heel left foot pared and enucleated with sterile scalpel blade without incident. Total number of lesions debrided=2. -Patient to report any pedal injuries to medical professional immediately. -Patient/POA to call should there be question/concern in the interim.  Return in about 3 months (around 03/19/2021).  Freddie Breech, DPM

## 2021-03-19 ENCOUNTER — Ambulatory Visit (INDEPENDENT_AMBULATORY_CARE_PROVIDER_SITE_OTHER): Payer: 59 | Admitting: Podiatry

## 2021-03-19 ENCOUNTER — Encounter: Payer: Self-pay | Admitting: Podiatry

## 2021-03-19 ENCOUNTER — Other Ambulatory Visit: Payer: Self-pay

## 2021-03-19 DIAGNOSIS — M79674 Pain in right toe(s): Secondary | ICD-10-CM

## 2021-03-19 DIAGNOSIS — M79672 Pain in left foot: Secondary | ICD-10-CM

## 2021-03-19 DIAGNOSIS — M79675 Pain in left toe(s): Secondary | ICD-10-CM

## 2021-03-19 DIAGNOSIS — Q828 Other specified congenital malformations of skin: Secondary | ICD-10-CM

## 2021-03-19 DIAGNOSIS — B351 Tinea unguium: Secondary | ICD-10-CM | POA: Diagnosis not present

## 2021-03-24 NOTE — Progress Notes (Signed)
Subjective: Cathy Howard is a pleasant 45 y.o. female patient seen today for painful porokeratotic lesion(s) left foot and painful mycotic toenails that limit ambulation. Painful toenails interfere with ambulation. Aggravating factors include wearing enclosed shoe gear. Pain is relieved with periodic professional debridement. Painful porokeratotic lesions are aggravated when weightbearing with and without shoegear. Pain is relieved with periodic professional debridement.   Her mother is present during today's visit. Mom and Dad are her primary caretakers.  PCP is Cathy Cowboy, MD. Last visit was: April or May, 2022, per Mother..  Allergies  Allergen Reactions   Fentanyl Other (See Comments)    O2 decreases   Maxitrol [Neomycin-Polymyxin-Dexameth]    Neomycin-Bacitracin Zn-Polymyx Swelling   Quetiapine Other (See Comments)    Over sedation     Objective: Physical Exam  General: Cathy Howard is a pleasant 45 y.o. Caucasian female, in NAD. AAO x 3.   Vascular:  Capillary refill time to digits immediate b/l. Palpable DP pulse(s) b/l lower extremities Palpable PT pulse(s) b/l lower extremities Pedal hair present. Lower extremity skin temperature gradient within normal limits. No edema noted b/l lower extremities.  Dermatological:  Skin warm and supple b/l lower extremities. Toenails 1-5 b/l elongated, discolored, dystrophic, thickened, crumbly with subungual debris and tenderness to dorsal palpation. Porokeratotic lesion(s) submet head 2 left foot and plantar aspect of heel left foot. No erythema, no edema, no drainage, no fluctuance.  Musculoskeletal:  Normal muscle strength 5/5 to all lower extremity muscle groups bilaterally. Hallux valgus with bunion deformity noted b/l lower extremities. Hammertoe(s) noted to the 2-5 bilaterally. Pes planus deformity noted b/l lower extremities.  Neurological:  Patient unable to follow commands of LE neurological examination due to cognitive  deficits. Patient does respond to external noxious stimuli.  Assessment and Plan:  1. Pain due to onychomycosis of toenails of both feet   2. Porokeratosis   3. Pain in left foot   -No new findings. No new orders. -Medicaid ABN signed for this year. Mother consents for services of paring of porokeratoses today. -Patient to continue soft, supportive shoe gear daily. -Toenails 1-5 b/l were debrided in length and girth with sterile nail nippers and dremel without iatrogenic bleeding.  -Painful porokeratotic lesion(s) submet head 2 left foot and plantar aspect of heel left foot pared and enucleated with sterile scalpel blade without incident. Total number of lesions debrided=2. -Patient to report any pedal injuries to medical professional immediately. -Patient/POA to call should there be question/concern in the interim.  Return in about 3 months (around 06/18/2021).  Freddie Breech, DPM

## 2021-05-15 ENCOUNTER — Ambulatory Visit (INDEPENDENT_AMBULATORY_CARE_PROVIDER_SITE_OTHER): Payer: 59

## 2021-05-15 ENCOUNTER — Other Ambulatory Visit: Payer: Self-pay

## 2021-05-15 VITALS — Temp 97.9°F

## 2021-05-15 DIAGNOSIS — Z23 Encounter for immunization: Secondary | ICD-10-CM | POA: Diagnosis not present

## 2021-06-24 ENCOUNTER — Ambulatory Visit: Payer: 59 | Admitting: Podiatry

## 2021-07-10 ENCOUNTER — Encounter: Payer: Managed Care, Other (non HMO) | Admitting: Adult Health Nurse Practitioner

## 2021-07-15 NOTE — Progress Notes (Signed)
COMPLETE PHYSICAL   Assessment / Plan:   Encounter for Annual Physical Exam with abnormal findings Due annually  Health Maintenance reviewed Healthy lifestyle reviewed and goals set  Intellectual disability Continue follow up with psych, mom is here with patient  Developmental delay Continue follow up with psych, mom is here with patient  Vitamin D deficiency Continue supplementation to maintain goal of 70-100 Taking Vitamin D 5,000 IU daily -     Vitamin D (25 hydroxy)  Hyperlipidemia, unspecified hyperlipidemia type -     Lipid Profile decrease fatty foods increase activity.   Attention deficit disorder, unspecified hyperactivity presence -  psych manages adderall; recently using only while at activity center per mother, limits use.   Obesity (BMI 30.0-34.9) - follow up 3 months for progress monitoring - increase veggies, decrease carbs - long discussion about weight loss, diet, and exercise - restart walking, snacking is issue, discussed with mom having healthy options in easy reach, place high calorie foods such as peanut butter out of reach  Anxiety Well managed by current regimen; continue medications Stress management techniques discussed, increase water, good sleep hygiene discussed, increase exercise, and increase veggies.  -Continue follow up psych  Arthritis, lumbar spine Continue follow up ortho, prn meloxicam, improved with hip replacements  Hip dysplasia, congenital S/P THR bilateral  Gastroesophageal reflux disease without esophagitis Continue PPI/H2 blocker, diet discussed  Abnormal glucose -     Hemoglobin A1c ([facility])  Medication management Continued  Screening for hematuria or proteinuria -     Urinalysis, Routine w reflex microscopic  Abnormal nipple - R ? Everted appearance Per mother intermittently ongoing for several years, has had normal mammograms in this time by GYN. Follows with Dr. Ulanda Edison but he hasn't see when active, worse  than usual with pain and not resolving No sx to suggest infection/mastitis, but she may need a biopsy of the local area. Will have her urgently follow up with Dr. Ulanda Edison for his recommendations.   . Further disposition pending results if labs check today. Discussed med's effects and SE's.   Over 30 minutes of face to face interview, exam, counseling, chart review, and critical decision making was performed.   Future Appointments  Date Time Provider [place]  07/16/2022  9:00 AM Liane Comber, NP [facility] None    HPI  45 y.o. female  presents for a complete physical. She has Vitamin D deficiency; ADD (attention deficit disorder); Intellectual disability; Hyperlipidemia; Anxiety; Acid reflux; Obesity (BMI 30.0-34.9); Arthritis, lumbar spine; Developmental delay; Fusion of spine of thoracolumbar region; At high risk for falls; Early menopause occurring in patient age younger than 41 years; Other specified disorders of bone density and structure, multiple sites; and Seasonal allergies on their problem list.  She has developmental delay/intellectual disability, mother is her primary caregiver and accompanies her today. She follows with psych for behavioral issues (inattention, anxiety, skin picking). She goes to activity center 3 days a week. She follows with GYN Dr. Ulanda Edison annually. He follows for mammograms, no pelvics due to anatomy, never sexually active. Has had [place].   Today reports day 5 of R nipple tenderness, has similar intermittently in the past, last in Feb 2022, has seen Dr. Ulanda Edison after this, mother reports she had screening mammogram and diagnostic mammogram earlier this year. Has been applying triple antibiotic.   S/P right hip replacement at [facility] with Dr. Shaune Pascal on [id number]. She also had a fusion of spin thoracolumbar region 02/27/2019.  Had left hip replacement in 10/2019. She has  done well since.   BMI is Body mass index is 32.24 kg/m., she has been working on exercise,  was walking 30-19min every morning until recently with cold weather.  Wt Readings from Last 3 Encounters:  07/16/21 149 lb (67.6 kg)  10/31/20 141 lb (64 kg)  07/10/20 137 lb (62.1 kg)   Her blood pressure has been controlled at home, today their BP is BP: 118/74.   She does workout. She denies chest pain, shortness of breath, dizziness.   She is not on cholesterol medication, working on lifestyle. Her cholesterol is not at goal. The cholesterol last visit was:  Lab Results  Component Value Date   CHOL 214 (H) 10/31/2020   HDL 76 10/31/2020   LDLCALC 123 (H) 10/31/2020   TRIG 60 10/31/2020   CHOLHDL 2.8 10/31/2020  . Her A1c has been controlled with diet/lifestyle. She denies any diabetic polys. Last A1C in the office was:  Lab Results  Component Value Date   HGBA1C 5.6 07/10/2020   Patient is on Vitamin D supplement,  5000 IU a day.   Lab Results  Component Value Date   VD25OH 43 07/10/2020        Current Medications:  Current Outpatient Medications on File Prior to Visit  Medication Sig Dispense Refill   acetaminophen (TYLENOL) 500 MG tablet Take by mouth.     amoxicillin (AMOXIL) 500 MG capsule Prior to dental appointments     amphetamine-dextroamphetamine (ADDERALL XR) 15 MG 24 hr capsule Take by mouth 3 (three) times a week.     aspirin EC 81 MG tablet Take by mouth once.      CHOLECALCIFEROL PO Take 10,000 Units by mouth daily.     famotidine (PEPCID) 20 MG tablet Take 20 mg by mouth daily. Prior to breakfast.     fexofenadine (ALLEGRA) 180 MG tablet Take 180 mg by mouth daily.     FLUoxetine (PROZAC) 20 MG capsule Take 60 mg by mouth every morning.  5   Magnesium 250 MG TABS Take by mouth daily.     melatonin 3 MG TABS tablet Take 3 mg by mouth at bedtime.     Multiple Vitamin (MULTIVITAMIN) tablet Take 1 tablet by mouth daily.     mupirocin ointment (BACTROBAN) 2 %      topiramate (TOPAMAX) 100 MG tablet Take 100 mg by mouth 2 (two) times daily.     vitamin C  (ASCORBIC ACID) 500 MG tablet Take 500 mg by mouth daily.     Lactobacillus Rhamnosus, GG, (RA PROBIOTIC DIGESTIVE CARE) CAPS Take by mouth. (Patient not taking: Reported on 07/16/2021)     meloxicam (MOBIC) 7.5 MG tablet TAKE 1 TAB BY MOUTH 2X DAILY AS NEEDED FOR PAIN (OKAY TO TAKE WITH TYLENOL, NOT ALEVE, IBUPROFEN). (Patient not taking: Reported on 07/16/2021) 60 tablet 5   No current facility-administered medications on file prior to visit.    Health Maintenance:   Immunization History  Administered Date(s) Administered   Influenza Inj Mdck Quad With Preservative 06/03/2017, 06/06/2018, 05/06/2020   Influenza Split 04/15/2015   Influenza,inj,Quad PF,6+ Mos 04/11/2019, 05/15/2021   Influenza,inj,quad, With Preservative 04/14/2016   Influenza-Unspecified 05/24/2014   PFIZER(Purple Top)SARS-COV-2 Vaccination 10/04/2019, 10/25/2019   PPD Test 02/19/2014   Tdap 02/07/2013   Tetanus: 2014 Flu: 04/2021 Pneumonia: n/a Covid 19: 2/2, 2021 Shingrix: discuss age 59 Covid 35: 2/2, pfizer  Pap: has not had related to anatomy, follows with Dr Ulanda Edison - 09/2019 Mammogram: mother reports negative screening mammogram 10/15/2020 at  GYN  Eye exam: Dr Jimmey Ralph last 2022, has scheduled 08/06/2021,  Dentist: Dr. Naomie Dean, last 2022, goes q21m  Patient Care Team: Lucky Cowboy, MD as PCP - General (Internal Medicine) Tracey Harries, MD as Consulting Physician (Obstetrics and Gynecology) Mat Carne, DO (Inactive) (Optometry) Lenn Sink, DPM as Consulting Physician (Podiatry)  Medical History:  Past Medical History:  Diagnosis Date   ADD (attention deficit disorder)    Hip dysplasia, congenital 01/25/2019   Hyperlipidemia    Mental retardation    OCD (obsessive compulsive disorder)    S/P hip replacement, bilateral 05/16/2019   Unspecified vitamin D deficiency    Allergies Allergies  Allergen Reactions   Fentanyl Other (See Comments)    O2 decreases   Maxitrol  [Neomycin-Polymyxin-Dexameth]    Neomycin-Bacitracin Zn-Polymyx Swelling   Quetiapine Other (See Comments)    Over sedation     SURGICAL HISTORY She  has a past surgical history that includes Hip Arthroplasty (Left, 10/2019); scoliosis surgery; Eye surgery; and Hip Arthroplasty (Right, 05/01/2019).   FAMILY HISTORY Her family history includes Cancer in her mother; Diabetes in her maternal grandmother; Heart disease in her maternal grandmother and mother; Hyperlipidemia in her mother; Stroke in her maternal grandmother.   SOCIAL HISTORY She  reports that she has never smoked. She has never used smokeless tobacco. She reports that she does not drink alcohol and does not use drugs.  Review of Systems: Review of Systems  Constitutional:  Negative for malaise/fatigue and weight loss.  HENT:  Negative for hearing loss and tinnitus.   Eyes:  Negative for blurred vision and double vision.  Respiratory:  Negative for cough, sputum production, shortness of breath and wheezing.   Cardiovascular:  Negative for chest pain, palpitations, orthopnea, claudication, leg swelling and PND.  Gastrointestinal:  Negative for abdominal pain, blood in stool, constipation, diarrhea, heartburn, melena, nausea and vomiting.  Genitourinary: Negative.   Musculoskeletal:  Negative for falls, joint pain and myalgias.  Skin:  Negative for rash.  Neurological:  Negative for dizziness, tingling, sensory change, weakness and headaches.  Endo/Heme/Allergies:  Negative for polydipsia.  Psychiatric/Behavioral: Negative.  Negative for depression, memory loss, substance abuse and suicidal ideas. The patient is not nervous/anxious and does not have insomnia.   All other systems reviewed and are negative.  Physical Exam: Estimated body mass index is 32.24 kg/m as calculated from the following:   Height as of this encounter: 4\' 9"  (1.448 m).   Weight as of this encounter: 149 lb (67.6 kg). BP 118/74    Pulse 100    Temp  97.9 F (36.6 C)    Ht 4\' 9"  (1.448 m)    Wt 149 lb (67.6 kg)    SpO2 91%    BMI 32.24 kg/m   General Appearance: Well nourished well developed, in no apparent distress.  Eyes: PERRLA, EOMs, conjunctiva no swelling or erythema, eyes wide set ENT/Mouth: Ear canals normal with moderate wet cerumen bilaterally, no swelling, erythema, or discharge.  TMs normal bilaterally with no erythema, bulging, retraction, or loss of landmark.  Oropharynx moist and clear with no exudate, erythema, or swelling.   Neck: Supple, thyroid normal. No bruits.  No cervical adenopathy Respiratory: Respiratory effort normal, Breath sounds clear A&P without wheeze, rhonchi, rales.   Cardio: RRR with 2+ murmur heard best on LSB, no rubs or gallops. Brisk peripheral pulses without edema.  Abdomen: Soft, nontender, no guarding, rebound, hernias, masses, or organomegaly.  Lymphatics: Non tender without lymphadenopathy.  Musculoskeletal: Full ROM  all peripheral extremities, 5/5 strength, and waddling gait. Moderate scoliosis to the right present on examination.  No kyphosis. Well healed surgical scar midline.  Skin: Warm, dry without rashes, lesions, ecchymosis. Neuro: Awake and oriented X 3, Cranial nerves intact, reflexes equal bilaterally. Normal muscle tone, no cerebellar symptoms. Sensation intact.  Psych:  normal affect for baseline, Insight and Judgment appropriate for patient.  Breasts: R nipple firm, enlarged, tender without redness, heat or discharge. Everted and dusky appearance with fleshy protrusions. Subtle subareolar fullness. Otherwise breasts symmetrical, smooth, no palpable axillary lymph node abnormality.  GU: Defer to GYN     EKG: NSR in 2021, defer    Izora Ribas, NP 9:34 AM [facility]

## 2021-07-16 ENCOUNTER — Ambulatory Visit (INDEPENDENT_AMBULATORY_CARE_PROVIDER_SITE_OTHER): Payer: 59 | Admitting: Adult Health

## 2021-07-16 ENCOUNTER — Encounter: Payer: Self-pay | Admitting: Adult Health

## 2021-07-16 ENCOUNTER — Other Ambulatory Visit: Payer: Self-pay

## 2021-07-16 VITALS — BP 118/74 | HR 100 | Temp 97.9°F | Ht <= 58 in | Wt 149.0 lb

## 2021-07-16 DIAGNOSIS — E559 Vitamin D deficiency, unspecified: Secondary | ICD-10-CM

## 2021-07-16 DIAGNOSIS — Z1389 Encounter for screening for other disorder: Secondary | ICD-10-CM

## 2021-07-16 DIAGNOSIS — Z0000 Encounter for general adult medical examination without abnormal findings: Secondary | ICD-10-CM | POA: Diagnosis not present

## 2021-07-16 DIAGNOSIS — Z131 Encounter for screening for diabetes mellitus: Secondary | ICD-10-CM

## 2021-07-16 DIAGNOSIS — F988 Other specified behavioral and emotional disorders with onset usually occurring in childhood and adolescence: Secondary | ICD-10-CM

## 2021-07-16 DIAGNOSIS — Z0001 Encounter for general adult medical examination with abnormal findings: Secondary | ICD-10-CM

## 2021-07-16 DIAGNOSIS — N649 Disorder of breast, unspecified: Secondary | ICD-10-CM

## 2021-07-16 DIAGNOSIS — K219 Gastro-esophageal reflux disease without esophagitis: Secondary | ICD-10-CM

## 2021-07-16 DIAGNOSIS — E669 Obesity, unspecified: Secondary | ICD-10-CM

## 2021-07-16 DIAGNOSIS — Z1329 Encounter for screening for other suspected endocrine disorder: Secondary | ICD-10-CM

## 2021-07-16 DIAGNOSIS — F79 Unspecified intellectual disabilities: Secondary | ICD-10-CM

## 2021-07-16 DIAGNOSIS — E785 Hyperlipidemia, unspecified: Secondary | ICD-10-CM

## 2021-07-16 DIAGNOSIS — Z130 Encounter for screening for diseases of the blood and blood-forming organs and certain disorders involving the immune mechanism: Secondary | ICD-10-CM

## 2021-07-16 DIAGNOSIS — F419 Anxiety disorder, unspecified: Secondary | ICD-10-CM

## 2021-07-17 ENCOUNTER — Encounter: Payer: Self-pay | Admitting: Adult Health

## 2021-07-17 ENCOUNTER — Other Ambulatory Visit: Payer: Self-pay | Admitting: Adult Health

## 2021-07-17 ENCOUNTER — Encounter: Payer: Self-pay | Admitting: Internal Medicine

## 2021-07-17 LAB — COMPLETE METABOLIC PANEL WITH GFR
AG Ratio: 1.7 (calc) (ref 1.0–2.5)
ALT: 16 U/L (ref 6–29)
AST: 27 U/L (ref 10–30)
Albumin: 4.3 g/dL (ref 3.6–5.1)
Alkaline phosphatase (APISO): 77 U/L (ref 31–125)
BUN: 16 mg/dL (ref 7–25)
CO2: 25 mmol/L (ref 20–32)
Calcium: 10.1 mg/dL (ref 8.6–10.2)
Chloride: 104 mmol/L (ref 98–110)
Creat: 0.61 mg/dL (ref 0.50–0.99)
Globulin: 2.5 g/dL (calc) (ref 1.9–3.7)
Glucose, Bld: 97 mg/dL (ref 65–99)
Potassium: 4.1 mmol/L (ref 3.5–5.3)
Sodium: 141 mmol/L (ref 135–146)
Total Bilirubin: 0.5 mg/dL (ref 0.2–1.2)
Total Protein: 6.8 g/dL (ref 6.1–8.1)
eGFR: 114 mL/min/{1.73_m2} (ref >=60)

## 2021-07-17 LAB — CBC WITH DIFFERENTIAL/PLATELET
Absolute Monocytes: 409 cells/uL (ref 200–950)
Basophils Absolute: 26 cells/uL (ref 0–200)
Basophils Relative: 0.2 %
Eosinophils Absolute: 53 cells/uL (ref 15–500)
Eosinophils Relative: 0.4 %
HCT: 42.9 % (ref 35.0–45.0)
Hemoglobin: 13.9 g/dL (ref 11.7–15.5)
Lymphs Abs: 383 cells/uL — ABNORMAL LOW (ref 850–3900)
MCH: 32 pg (ref 27.0–33.0)
MCHC: 32.4 g/dL (ref 32.0–36.0)
MCV: 98.8 fL (ref 80.0–100.0)
MPV: 10.3 fL (ref 7.5–12.5)
Monocytes Relative: 3.1 %
Neutro Abs: 12329 cells/uL — ABNORMAL HIGH (ref 1500–7800)
Neutrophils Relative %: 93.4 %
Platelets: 222 10*3/uL (ref 140–400)
RBC: 4.34 10*6/uL (ref 3.80–5.10)
RDW: 12 % (ref 11.0–15.0)
Total Lymphocyte: 2.9 %
WBC: 13.2 10*3/uL — ABNORMAL HIGH (ref 3.8–10.8)

## 2021-07-17 LAB — URINALYSIS, ROUTINE W REFLEX MICROSCOPIC
Bilirubin Urine: NEGATIVE
Glucose, UA: NEGATIVE
Hgb urine dipstick: NEGATIVE
Ketones, ur: NEGATIVE
Leukocytes,Ua: NEGATIVE
Nitrite: NEGATIVE
Protein, ur: NEGATIVE
Specific Gravity, Urine: 1.014 (ref 1.001–1.035)
pH: 7.5 (ref 5.0–8.0)

## 2021-07-17 LAB — LIPID PANEL
Cholesterol: 232 mg/dL — ABNORMAL HIGH (ref <200)
HDL: 79 mg/dL (ref >=50)
LDL Cholesterol (Calc): 132 mg/dL (calc) — ABNORMAL HIGH
Non-HDL Cholesterol (Calc): 153 mg/dL (calc) — ABNORMAL HIGH (ref <130)
Total CHOL/HDL Ratio: 2.9 (calc) (ref <5.0)
Triglycerides: 107 mg/dL (ref <150)

## 2021-07-17 LAB — HEMOGLOBIN A1C
Hgb A1c MFr Bld: 5.2 %{Hb}
Mean Plasma Glucose: 103 mg/dL
eAG (mmol/L): 5.7 mmol/L

## 2021-07-17 LAB — MAGNESIUM: Magnesium: 1.5 mg/dL (ref 1.5–2.5)

## 2021-07-17 LAB — TSH: TSH: 0.97 m[IU]/L

## 2021-07-17 LAB — VITAMIN D 25 HYDROXY (VIT D DEFICIENCY, FRACTURES): Vit D, 25-Hydroxy: 35 ng/mL (ref 30–100)

## 2021-07-18 ENCOUNTER — Other Ambulatory Visit: Payer: Self-pay | Admitting: Obstetrics and Gynecology

## 2021-07-18 DIAGNOSIS — N644 Mastodynia: Secondary | ICD-10-CM

## 2021-10-07 ENCOUNTER — Encounter: Payer: Self-pay | Admitting: Adult Health

## 2021-10-22 ENCOUNTER — Ambulatory Visit (INDEPENDENT_AMBULATORY_CARE_PROVIDER_SITE_OTHER): Payer: 59 | Admitting: Nurse Practitioner

## 2021-10-22 ENCOUNTER — Encounter: Payer: Self-pay | Admitting: Nurse Practitioner

## 2021-10-22 VITALS — BP 122/84 | HR 87 | Temp 97.5°F | Wt 136.6 lb

## 2021-10-22 DIAGNOSIS — L089 Local infection of the skin and subcutaneous tissue, unspecified: Secondary | ICD-10-CM | POA: Diagnosis not present

## 2021-10-22 DIAGNOSIS — S00521A Blister (nonthermal) of lip, initial encounter: Secondary | ICD-10-CM

## 2021-10-22 DIAGNOSIS — R220 Localized swelling, mass and lump, head: Secondary | ICD-10-CM | POA: Diagnosis not present

## 2021-10-22 MED ORDER — PREDNISONE 10 MG PO TABS: ORAL_TABLET | ORAL | 0 refills | Status: DC

## 2021-10-22 MED ORDER — CEPHALEXIN 500 MG PO CAPS: 500.0000 mg | ORAL_CAPSULE | Freq: Two times a day (BID) | ORAL | 0 refills | Status: AC

## 2021-10-22 NOTE — Patient Instructions (Addendum)
Prednisone 2 tabs for 3 days and 1 tab for 4 days ?Keflex 1 tab twice a day for 7 days ? ?Impetigo, Adult ?Impetigo is an infection of the skin. It commonly occurs in young children, but it can also occur in adults. The infection causes itchy blisters and sores that produce brownish-yellow fluid. As the fluid dries, it forms a thick, honey-colored crust. These skin changes usually occur on the face, but they can also affect other areas of the body. Impetigo usually goes away in 7-10 days with treatment. ?What are the causes? ?This condition is caused by two types of bacteria. It may be caused by staphylococci or streptococci bacteria. These bacteria cause impetigo when they get under the surface of the skin. This often happens after some damage to the skin, such as: ?Cuts, scrapes, or scratches. ?Rashes. ?Insect bites, especially when you scratch the area of a bite. ?Chickenpox or other illnesses that cause open skin sores. ?Nail biting or chewing. ?Impetigo can spread easily from one person to another (is contagious). It may be spread through close skin contact or by sharing towels, clothing, or other items that an infected person has touched. ?Scratching the affected area can cause impetigo to spread to other parts of the body. The bacteria can get under your fingernails and spread when you touch another area of your skin. ?What increases the risk? ?The following factors may make you more likely to develop this condition: ?Playing sports that include skin-to-skin contact with others. ?Having broken skin, such as from a cut or scrape. ?Living in an area that has high humidity levels. ?Having poor hygiene. ?Having high levels of staphylococci in your nose. ?Having a condition that weakens the skin integrity, such as: ?Having a weak body defense system (immune system). ?Having a skin condition with open sores, such as chickenpox. ?Having diabetes. ?What are the signs or symptoms? ?The main symptom of this condition  is small blisters, often on the face around the mouth and nose. In time, the blisters break open and turn into tiny sores (lesions) with a yellow crust. In some cases, the blisters cause itching or burning. Scratching, irritation, or lack of treatment may cause these small lesions to get larger. ?Other possible symptoms include: ?Larger blisters. ?Pus. ?Swollen lymph glands. ?How is this diagnosed? ?This condition is usually diagnosed during a physical exam. A skin sample or a sample of fluid from a blister may be taken for lab tests that involve growing bacteria (culture test). Lab tests can help confirm the diagnosis or help determine the best treatment. ?How is this treated? ?Treatment for this condition depends on the severity of the condition: ?Mild impetigo can be treated with prescription antibiotic cream. ?Oral antibiotic medicine may be used in more severe cases. ?Medicines that reduce itchiness (antihistamines)may also be used. ?Follow these instructions at home: ?Medicines ?Take over-the-counter and prescription medicines only as told by your health care provider. ?Apply or take your antibiotic as told by your health care provider. Do not stop using the antibiotic even if your condition improves. ?Before applying antibiotic cream or ointment, you should: ?Gently wash the infected areas with antibacterial soap and warm water. ?Soak crusted areas in warm, soapy water using antibacterial soap. ?Gently rub the areas to remove crusts. Do not scrub. ?Preventing the spread of infection ? ?To help prevent impetigo from spreading to other body areas: ?Keep your fingernails short and clean. ?Do not scratch the blisters or sores. ?Cover infected areas, if necessary, to keep from scratching. ?  Wash your hands often with soap and warm water. ?To help prevent impetigo from spreading to other people: ?Do not share towels. ?Wash your clothing and bedsheets in water that is 140?F (60?C) or warmer. ?Stay home until you have  used an antibiotic cream for 48 hours (2 days) or an oral antibiotic medicine for 24 hours (1 day). ?You should only return to work and activities with other people if your skin shows significant improvement. ?You may return to contact sports after you have used antibiotic medicine for 72 hours (3 days). ?General instructions ?Keep all follow-up visits. This is important. ?How is this prevented? ?Wash your hands often with soap and warm water. ?Do not share towels, washcloths, clothing, bedding, or razors. ?Keep your fingernails short. ?Keep any cuts, scrapes, bug bites, or rashes clean and covered. ?Use insect repellent to prevent bug bites. ?Contact a health care provider if: ?You develop more blisters or sores, even with treatment. ?Other family members get sores. ?Your skin sores are not improving after 72 hours (3 days) of treatment. ?You have a fever. ?Get help right away if: ?You see spreading redness or swelling of the skin around your sores. ?You develop a sore throat. ?The area around your rash becomes warm, red, or tender to the touch. ?You have dark, reddish-brown urine. ?You do not urinate often or you urinate small amounts. ?You are very tired (lethargic). ?You have swelling in your face, hands, or feet. ?Summary ?Impetigo is a skin infection that causes itchy blisters and sores that produce brownish-yellow fluid. As the fluid dries, it forms a crust. ?This condition is caused by staphylococci or streptococci bacteria. These bacteria cause impetigo when they get under the surface of the skin, such as through cuts, rashes, bug bites, or open sores. ?Treatment for this condition may include antibiotic ointment or oral antibiotics. ?To help prevent impetigo from spreading to other body areas, make sure you keep your fingernails short, avoid scratching, cover any blisters, and wash your hands often. ?If you have impetigo, stay home until you have used an antibiotic cream for 48 hours (2 days) or an oral  antibiotic medicine for 24 hours (1 day). You should only return to work and activities with other people if your skin shows significant improvement. ?This information is not intended to replace advice given to you by your health care provider. Make sure you discuss any questions you have with your health care provider. ?Document Revised: 12/06/2019 Document Reviewed: 12/06/2019 ?Elsevier Patient Education ? 2022 [employer] ? ?

## 2021-10-22 NOTE — Progress Notes (Signed)
Assessment and Plan: ? ?Ronee was seen today for acute visit. ? ?Diagnoses and all orders for this visit: ? ?Lip swelling ?-     cephALEXin (KEFLEX) 500 MG capsule; Take 1 capsule (500 mg total) by mouth 2 (two) times daily for 7 days. ?-     predniSONE (DELTASONE) 10 MG tablet; 2 tablets daily for 3 days, 1 tablet daily for 4 days. ? ?Blister of lip with infection, initial encounter ?-     cephALEXin (KEFLEX) 500 MG capsule; Take 1 capsule (500 mg total) by mouth 2 (two) times daily for 7 days. ?-     predniSONE (DELTASONE) 10 MG tablet; 2 tablets daily for 3 days, 1 tablet daily for 4 days. ?If worsens or is not improved by Monday notify the office.  ?  ? ? ? ?Further disposition pending results of labs. Discussed med's effects and SE's.   ?Over 30 minutes of exam, counseling, chart review, and critical decision making was performed.  ? ?Future Appointments  ?Date Time Provider Department Center  ?12/11/2021 10:30 AM Judd Gaudier, NP [facility] None  ?07/16/2022  9:00 AM Judd Gaudier, NP [facility] None  ? ? ?------------------------------------------------------------------------------------------------------------------ ? ? ?HPI ?BP 122/84   Pulse 87   Temp (!) 97.5 ?F (36.4 ?C)   Wt 136 lb 9.6 oz (62 kg)   SpO2 96%   BMI 29.56 kg/m?  ? ?46 y.o.female presents for lip swelling with sore which has been present for 2 days.  Uncertain of injury, no new products being used.  She does compulsively scrape teeth across her lips. She has also been picking an area on her left shin and will then put hands to nose/ mouth. ?Mom present and provides history  ? ?Past Medical History:  ?Diagnosis Date  ? ADD (attention deficit disorder)   ? Hip dysplasia, congenital 01/25/2019  ? Hyperlipidemia   ? Mental retardation   ? OCD (obsessive compulsive disorder)   ? S/P hip replacement, bilateral 05/16/2019  ? Unspecified vitamin D deficiency   ?  ? ?Allergies  ?Allergen Reactions  ? Fentanyl Other (See Comments)  ?  O2  decreases  ? Maxitrol [Neomycin-Polymyxin-Dexameth]   ? Neomycin-Bacitracin Zn-Polymyx Swelling  ? Quetiapine Other (See Comments)  ?  Over sedation   ? ? ?Current Outpatient Medications on File Prior to Visit  ?Medication Sig  ? acetaminophen (TYLENOL) 500 MG tablet Take by mouth.  ? amoxicillin (AMOXIL) 500 MG capsule Prior to dental appointments  ? amphetamine-dextroamphetamine (ADDERALL XR) 15 MG 24 hr capsule Take by mouth 3 (three) times a week.  ? aspirin EC 81 MG tablet Take by mouth once.   ? CHOLECALCIFEROL PO Take 10,000 Units by mouth daily.  ? famotidine (PEPCID) 20 MG tablet Take 20 mg by mouth daily. Prior to breakfast.  ? fexofenadine (ALLEGRA) 180 MG tablet Take 180 mg by mouth daily.  ? FLUoxetine (PROZAC) 20 MG capsule Take 60 mg by mouth every morning.  ? Magnesium 250 MG TABS Take by mouth daily.  ? Multiple Vitamin (MULTIVITAMIN) tablet Take 1 tablet by mouth daily.  ? mupirocin ointment (BACTROBAN) 2 %   ? topiramate (TOPAMAX) 100 MG tablet Take 100 mg by mouth 2 (two) times daily.  ? vitamin C (ASCORBIC ACID) 500 MG tablet Take 500 mg by mouth daily.  ? Lactobacillus Rhamnosus, GG, (RA PROBIOTIC DIGESTIVE CARE) CAPS Take by mouth. (Patient not taking: Reported on 07/16/2021)  ? Melatonin 3 MG TBDP Take 1-2 tablets by mouth daily as needed. (  Patient not taking: Reported on 10/22/2021)  ? meloxicam (MOBIC) 7.5 MG tablet TAKE 1 TAB BY MOUTH 2X DAILY AS NEEDED FOR PAIN (OKAY TO TAKE WITH TYLENOL, NOT ALEVE, IBUPROFEN). (Patient not taking: Reported on 07/16/2021)  ? ?No current facility-administered medications on file prior to visit.  ? ? ?ROS: all negative except above.  ? ?Physical Exam: ? ?BP 122/84   Pulse 87   Temp (!) 97.5 ?F (36.4 ?C)   Wt 136 lb 9.6 oz (62 kg)   SpO2 96%   BMI 29.56 kg/m?  ? ?General Appearance: Well nourished, in no apparent distress. ?Eyes: PERRLA, EOMs, conjunctiva no swelling or erythema ?Sinuses: No Frontal/maxillary tenderness ?ENT/Mouth: Ext aud canals clear,  TMs without erythema, bulging. No erythema, swelling, or exudate on post pharynx.  Tonsils not swollen or erythematous. Hearing normal.  ?Neck: Supple, thyroid normal.  ?Respiratory: Respiratory effort normal, BS equal bilaterally without rales, rhonchi, wheezing or stridor.  ?Cardio: RRR with no MRGs. Brisk peripheral pulses without edema.  ?Abdomen: Soft, + BS.  Non tender, no guarding, rebound, hernias, masses. ?Lymphatics: Non tender without lymphadenopathy.  ?Musculoskeletal: Full ROM, 5/5 strength, normal gait.  ?Skin: Warm, dry . 1 cm blister area lower lip with swelling of entire lower lip.   ?Neuro: Cranial nerves intact. Normal muscle tone, no cerebellar symptoms. Sensation intact.  ?Psych: Awake and oriented X 3, normal affect ?  ? ?Revonda Humphrey, NP ?10:43 AM ?[facility] ? ?

## 2021-12-09 ENCOUNTER — Encounter: Payer: Self-pay | Admitting: Adult Health

## 2021-12-11 ENCOUNTER — Ambulatory Visit (INDEPENDENT_AMBULATORY_CARE_PROVIDER_SITE_OTHER): Payer: 59 | Admitting: Adult Health

## 2021-12-11 ENCOUNTER — Encounter: Payer: Self-pay | Admitting: Adult Health

## 2021-12-11 VITALS — BP 120/78 | HR 59 | Temp 97.3°F | Wt 137.0 lb

## 2021-12-11 DIAGNOSIS — R6250 Unspecified lack of expected normal physiological development in childhood: Secondary | ICD-10-CM

## 2021-12-11 DIAGNOSIS — Z79899 Other long term (current) drug therapy: Secondary | ICD-10-CM

## 2021-12-11 DIAGNOSIS — F79 Unspecified intellectual disabilities: Secondary | ICD-10-CM | POA: Diagnosis not present

## 2021-12-11 DIAGNOSIS — E785 Hyperlipidemia, unspecified: Secondary | ICD-10-CM

## 2021-12-11 DIAGNOSIS — E559 Vitamin D deficiency, unspecified: Secondary | ICD-10-CM | POA: Diagnosis not present

## 2021-12-11 DIAGNOSIS — E663 Overweight: Secondary | ICD-10-CM

## 2021-12-11 DIAGNOSIS — F419 Anxiety disorder, unspecified: Secondary | ICD-10-CM

## 2021-12-11 DIAGNOSIS — L089 Local infection of the skin and subcutaneous tissue, unspecified: Secondary | ICD-10-CM

## 2021-12-11 DIAGNOSIS — F988 Other specified behavioral and emotional disorders with onset usually occurring in childhood and adolescence: Secondary | ICD-10-CM

## 2021-12-11 MED ORDER — MUPIROCIN 2 % EX OINT: TOPICAL_OINTMENT | CUTANEOUS | 2 refills | Status: AC

## 2021-12-11 NOTE — Progress Notes (Signed)
4MOV with acute visit   Assessment and Plan:  Cholesterol Currently moderate elevations without medication Strong preference to work on lifestyle LDL goal <100, but if persistently above 130 discuss statin Continue low cholesterol diet and exercise.  Check lipid panel.   Other abnormal glucose Recent A1Cs at goal Discussed diet/exercise, weight management  Defer A1C; check CMP  Overweight - BMI 29, with hld Long discussion about weight loss, diet, and exercise Recommended diet heavy in fruits and veggies and low in animal meats, cheeses, and dairy products, appropriate calorie intake Discussed ideal weight for height  Will follow up in 3 months  Vitamin D Def Below goal at last visit; she did increase supplement and had recheck at [facility] that was close to goal of 60; continue for now Defer Vit D level  ADD Continue medications Helps with focus, no AE's. The patient was counseled on the addictive nature of the medication and was encouraged to take drug holidays when not needed.   Anxiety Well managed by current regimen; continue medications Stress management techniques discussed, increase water, good sleep hygiene discussed, increase exercise, and increase veggies.   Infected wound Discussed strategies to reduce picking Try bactroban BID for 1-2 weeks, but contact office if not seeing improvement in 3 days, plan to add oral abx    Further disposition pending results of labs. Discussed med's effects and SE's.   Over 30 minutes of exam, counseling, chart review, and critical decision making was performed.   Future Appointments  Date Time Provider [place]  07/16/2022  9:00 AM Liane Comber, NP [facility] None    ------------------------------------------------------------------------------------------------------------------   HPI 46 y.o.female with ID presents accompanied by her mother 6 MOV. Mother gives most history.    She is managed by psych NP Teryl Lucy.  She is on topamax for anxiety. Patient is on an ADD medication (adderall XR 15 mg, only takes 3 days a week when she [facility] to [facility]), she states that the medication is helping and she denies any adverse reactions. She has a diagnosis of anxiety and is currently on prozac 60 mg daily and has lorazepam but NEVER takes it reports symptoms are well controlled on current regimen.   She has chronic picking, has some lesions on hands and legs that mom is concerned due to some erythema.   BMI is Body mass index is 29.65 kg/m., she has been working on diet and exercise, 30-40 min walking 2-3 days a week in the AM, working up.  Wt Readings from Last 3 Encounters:  12/11/21 137 lb (62.1 kg)  10/22/21 136 lb 9.6 oz (62 kg)  07/16/21 149 lb (67.6 kg)   Her blood pressure has been controlled at home, today their BP is BP: 120/78  She does workout. She denies chest pain, shortness of breath, dizziness.   She is not on cholesterol medication and denies myalgias. Her cholesterol is not at goal. The cholesterol last visit was:   Lab Results  Component Value Date   CHOL 232 (H) 07/16/2021   HDL 79 07/16/2021   LDLCALC 132 (H) 07/16/2021   TRIG 107 07/16/2021   CHOLHDL 2.9 07/16/2021    She has been working on diet and exercise for glucose management, and denies increased appetite, nausea, paresthesia of the feet, polydipsia and polyuria. Last A1C in the office was:  Lab Results  Component Value Date   HGBA1C 5.2 07/16/2021   Patient is on Vitamin D supplement, had recheck at [facility] on 09/02/2021 was  23 Lab Results  Component Value Date   VD25OH 35 07/16/2021       Immunization History  Administered Date(s) Administered   Influenza Inj Mdck Quad With Preservative 06/03/2017, 06/06/2018, 05/06/2020   Influenza Split 04/15/2015   Influenza,inj,Quad PF,6+ Mos 04/11/2019, 05/15/2021   Influenza,inj,quad, With Preservative 04/14/2016   Influenza-Unspecified 05/24/2014    PFIZER(Purple Top)SARS-COV-2 Vaccination 10/04/2019, 10/25/2019   PPD Test 02/19/2014   Tdap 02/07/2013     Past Medical History:  Diagnosis Date   ADD (attention deficit disorder)    Hip dysplasia, congenital 01/25/2019   Hyperlipidemia    Mental retardation    OCD (obsessive compulsive disorder)    S/P hip replacement, bilateral 05/16/2019   Unspecified vitamin D deficiency      Allergies  Allergen Reactions   Fentanyl Other (See Comments)    O2 decreases   Maxitrol [Neomycin-Polymyxin-Dexameth]    Neomycin-Bacitracin Zn-Polymyx Swelling   Quetiapine Other (See Comments)    Over sedation     Current Outpatient Medications on File Prior to Visit  Medication Sig   acetaminophen (TYLENOL) 500 MG tablet Take by mouth.   amphetamine-dextroamphetamine (ADDERALL XR) 15 MG 24 hr capsule Take by mouth 3 (three) times a week.   aspirin EC 81 MG tablet Take by mouth once.    CHOLECALCIFEROL PO Take 10,000 Units by mouth daily.   famotidine (PEPCID) 20 MG tablet Take 20 mg by mouth daily. Prior to breakfast.   fexofenadine (ALLEGRA) 180 MG tablet Take 180 mg by mouth daily.   FLUoxetine (PROZAC) 20 MG capsule Take 60 mg by mouth every morning.   Magnesium 250 MG TABS Take by mouth daily.   Multiple Vitamin (MULTIVITAMIN) tablet Take 1 tablet by mouth daily.   mupirocin ointment (BACTROBAN) 2 %    topiramate (TOPAMAX) 100 MG tablet Take 100 mg by mouth 2 (two) times daily.   vitamin C (ASCORBIC ACID) 500 MG tablet Take 500 mg by mouth daily.   predniSONE (DELTASONE) 10 MG tablet 2 tablets daily for 3 days, 1 tablet daily for 4 days.   No current facility-administered medications on file prior to visit.    ROS: all negative except above.   Physical Exam:  BP 120/78   Pulse (!) 59   Temp (!) 97.3 F (36.3 C)   Wt 137 lb (62.1 kg)   SpO2 99%   BMI 29.65 kg/m   General Appearance: Well nourished, good hygiene, in no apparent distress.  Eyes: PERRLA, EOMs, conjunctiva no  swelling or erythema, eyes wide set ENT/Mouth: Ear canals normal with cerument obstruction bilaterally, swelling, erythema, or discharge.  TMs normal bilaterally with no erythema, bulging, retraction, or loss of landmark.  Oropharynx moist and clear with no exudate, erythema, or swelling.   Neck: Supple, thyroid normal. No bruits.  No cervical adenopathy Respiratory: Respiratory effort normal, Breath sounds clear A&P without wheeze, rhonchi, rales.   Cardio: RRR without murmur, no rubs or gallops. Brisk peripheral pulses with 1+ edema. No warmth, no redness, no hard cord or discomfort.   Abdomen: Soft, nontender, no guarding  Lymphatics: Non tender without lymphadenopathy.  Musculoskeletal: Full ROM all peripheral extremities,5/5 strength,  Scoliosis to the right present on examination.  No kyphosis. Slow waddling gait.  Skin: Warm, dry without rashes, ecchymosis. R hand dorsum, R shin, left hand, with small excoriated areas, R hand and shin with local erythema and scant purulent appearance to open wound.  Neuro: Awake and oriented X 2, Normal muscle tone,  Sensation intact.  Psych:  normal affect for baseline, Insight and Judgment appropriate for patient.     Izora Ribas, NP 10:50 AM [facility]

## 2021-12-12 LAB — CBC WITH DIFFERENTIAL/PLATELET
Absolute Monocytes: 448 cells/uL (ref 200–950)
Basophils Absolute: 49 cells/uL (ref 0–200)
Basophils Relative: 0.9 %
Eosinophils Absolute: 238 cells/uL (ref 15–500)
Eosinophils Relative: 4.4 %
HCT: 40.7 % (ref 35.0–45.0)
Hemoglobin: 13.1 g/dL (ref 11.7–15.5)
Lymphs Abs: 1296 cells/uL (ref 850–3900)
MCH: 31 pg (ref 27.0–33.0)
MCHC: 32.2 g/dL (ref 32.0–36.0)
MCV: 96.4 fL (ref 80.0–100.0)
MPV: 9.4 fL (ref 7.5–12.5)
Monocytes Relative: 8.3 %
Neutro Abs: 3370 cells/uL (ref 1500–7800)
Neutrophils Relative %: 62.4 %
Platelets: 313 10*3/uL (ref 140–400)
RBC: 4.22 10*6/uL (ref 3.80–5.10)
RDW: 11.8 % (ref 11.0–15.0)
Total Lymphocyte: 24 %
WBC: 5.4 10*3/uL (ref 3.8–10.8)

## 2021-12-12 LAB — COMPLETE METABOLIC PANEL WITH GFR
AG Ratio: 2 (calc) (ref 1.0–2.5)
ALT: 10 U/L (ref 6–29)
AST: 25 U/L (ref 10–30)
Albumin: 4.6 g/dL (ref 3.6–5.1)
Alkaline phosphatase (APISO): 61 U/L (ref 31–125)
BUN: 20 mg/dL (ref 7–25)
CO2: 24 mmol/L (ref 20–32)
Calcium: 9.6 mg/dL (ref 8.6–10.2)
Chloride: 103 mmol/L (ref 98–110)
Creat: 0.59 mg/dL (ref 0.50–0.99)
Globulin: 2.3 g/dL (calc) (ref 1.9–3.7)
Glucose, Bld: 90 mg/dL (ref 65–99)
Potassium: 4.4 mmol/L (ref 3.5–5.3)
Sodium: 136 mmol/L (ref 135–146)
Total Bilirubin: 0.5 mg/dL (ref 0.2–1.2)
Total Protein: 6.9 g/dL (ref 6.1–8.1)
eGFR: 114 mL/min/{1.73_m2} (ref >=60)

## 2021-12-12 LAB — TSH: TSH: 0.92 mIU/L

## 2021-12-12 LAB — LIPID PANEL
Cholesterol: 231 mg/dL — ABNORMAL HIGH (ref <200)
HDL: 82 mg/dL (ref >=50)
LDL Cholesterol (Calc): 133 mg/dL (calc) — ABNORMAL HIGH
Non-HDL Cholesterol (Calc): 149 mg/dL (calc) — ABNORMAL HIGH (ref <130)
Total CHOL/HDL Ratio: 2.8 (calc) (ref <5.0)
Triglycerides: 70 mg/dL (ref <150)

## 2022-01-06 ENCOUNTER — Encounter: Payer: Self-pay | Admitting: Adult Health

## 2022-01-26 ENCOUNTER — Encounter: Payer: Self-pay | Admitting: Nurse Practitioner

## 2022-02-26 ENCOUNTER — Encounter: Payer: Self-pay | Admitting: Nurse Practitioner

## 2022-03-03 DIAGNOSIS — H43813 Vitreous degeneration, bilateral: Secondary | ICD-10-CM | POA: Diagnosis not present

## 2022-03-03 DIAGNOSIS — Z961 Presence of intraocular lens: Secondary | ICD-10-CM | POA: Diagnosis not present

## 2022-03-03 DIAGNOSIS — H0102A Squamous blepharitis right eye, upper and lower eyelids: Secondary | ICD-10-CM | POA: Diagnosis not present

## 2022-03-03 DIAGNOSIS — H17822 Peripheral opacity of cornea, left eye: Secondary | ICD-10-CM | POA: Diagnosis not present

## 2022-03-09 ENCOUNTER — Other Ambulatory Visit: Payer: Self-pay | Admitting: Nurse Practitioner

## 2022-03-10 ENCOUNTER — Other Ambulatory Visit: Payer: Self-pay | Admitting: Nurse Practitioner

## 2022-03-10 ENCOUNTER — Encounter: Payer: Self-pay | Admitting: Psychology

## 2022-03-17 DIAGNOSIS — F70 Mild intellectual disabilities: Secondary | ICD-10-CM | POA: Diagnosis not present

## 2022-03-17 DIAGNOSIS — F429 Obsessive-compulsive disorder, unspecified: Secondary | ICD-10-CM | POA: Diagnosis not present

## 2022-03-17 DIAGNOSIS — F902 Attention-deficit hyperactivity disorder, combined type: Secondary | ICD-10-CM | POA: Diagnosis not present

## 2022-05-11 NOTE — Progress Notes (Signed)
Assessment and Plan: Diagnoses and all orders for this visit:  Overweight (BMI 25.0-29.9) Overweight Long discussion about weight loss, diet, and exercise Recommended diet heavy in fruits and veggies and low in animal meats, cheeses, and dairy products, appropriate calorie intake Follow up at next visit   Acute frontal sinusitis, recurrence not specified Use Mucinex as needed. Push fluids -     dexamethasone (DECADRON) 1 MG tablet; Take 3 tabs for 3 days, 2 tabs for 3 days 1 tab for 5 days. Take with food. -     azithromycin (ZITHROMAX) 250 MG tablet; Take 2 tablets (500 mg) on  Day 1,  followed by 1 tablet (250 mg) once daily on Days 2 through 5. -     promethazine-dextromethorphan (PROMETHAZINE-DM) 6.25-15 MG/5ML syrup; Take 5 mLs by mouth 4 (four) times daily as needed for cough. -     fluticasone (FLONASE) 50 MCG/ACT nasal spray; Place 2 sprays into both nostrils daily.  Bilateral impacted cerumen -     Ear Lavage - Thick dried wax removed. Instructed on ear hygiene       Further disposition pending results of labs. Discussed med's effects and SE's.   Over 30 minutes of exam, counseling, chart review, and critical decision making was performed.   Future Appointments  Date Time Provider Department Center  07/16/2022  9:00 AM Raynelle Dick, NP [facility] None  09/30/2022 10:00 AM Hershal Coria, PsyD [facility] CPR    ------------------------------------------------------------------------------------------------------------------   HPI BP 110/68   Pulse 78   Temp 97.7 F (36.5 C)   Ht 4\' 9"  (1.448 m)   Wt 133 lb 9.6 oz (60.6 kg)   SpO2 97%   BMI 28.91 kg/m    46 y.o.female presents for cough/congestion which has been present for 9 days  Covid test at home was negative. She can get a yellow/green nasal mucus.  She does occ cough up some yellow phlegm.  Does note headache/congestion.  Denies nausea, vomiting, diarrhea, body aches.    BMI is Body mass index is  28.91 kg/m., she has not been working on diet and exercise. Wt Readings from Last 3 Encounters:  05/12/22 133 lb 9.6 oz (60.6 kg)  12/11/21 137 lb (62.1 kg)  10/22/21 136 lb 9.6 oz (62 kg)     Past Medical History:  Diagnosis Date   ADD (attention deficit disorder)    Hip dysplasia, congenital 01/25/2019   Hyperlipidemia    Mental retardation    OCD (obsessive compulsive disorder)    S/P hip replacement, bilateral 05/16/2019   Unspecified vitamin D deficiency      Allergies  Allergen Reactions   Fentanyl Other (See Comments)    O2 decreases   Maxitrol [Neomycin-Polymyxin-Dexameth]    Neomycin-Bacitracin Zn-Polymyx Swelling   Quetiapine Other (See Comments)    Over sedation     Current Outpatient Medications on File Prior to Visit  Medication Sig   acetaminophen (TYLENOL) 500 MG tablet Take by mouth.   amphetamine-dextroamphetamine (ADDERALL XR) 15 MG 24 hr capsule Take by mouth 3 (three) times a week.   aspirin EC 81 MG tablet Take by mouth once.    cetirizine (ZYRTEC) 10 MG tablet Take 10 mg by mouth daily.   CHOLECALCIFEROL PO Take 10,000 Units by mouth daily.   famotidine (PEPCID) 20 MG tablet Take 20 mg by mouth daily. Prior to breakfast.   FLUoxetine (PROZAC) 20 MG capsule Take 60 mg by mouth every morning.   Magnesium 250 MG TABS Take by  mouth daily.   Multiple Vitamin (MULTIVITAMIN) tablet Take 1 tablet by mouth daily.   mupirocin ointment (BACTROBAN) 2 % Apply twice daily for 1-2 weeks.   topiramate (TOPAMAX) 100 MG tablet Take 100 mg by mouth 2 (two) times daily.   vitamin C (ASCORBIC ACID) 500 MG tablet Take 500 mg by mouth daily.   fexofenadine (ALLEGRA) 180 MG tablet Take 180 mg by mouth daily. (Patient not taking: Reported on 05/12/2022)   No current facility-administered medications on file prior to visit.    ROS: all negative except above.   Physical Exam:  BP 110/68   Pulse 78   Temp 97.7 F (36.5 C)   Ht 4\' 9"  (1.448 m)   Wt 133 lb 9.6 oz (60.6  kg)   SpO2 97%   BMI 28.91 kg/m   General Appearance: Well nourished, in no apparent distress. Eyes: PERRLA, EOMs, conjunctiva no swelling or erythema Sinuses: Positive frontal tenderness ENT/Mouth: Ext aud canals clear, TMs bilateral impacted cerumen. TM shows no bulging or erythema after cerumen removed No erythema, swelling, or exudate on post pharynx.  Tonsils not swollen or erythematous. Hearing normal.  Neck: Supple, thyroid normal.  Respiratory: Respiratory effort normal, BS equal bilaterally without rales, rhonchi, wheezing or stridor.  Cardio: RRR with no MRGs. Brisk peripheral pulses without edema.  Abdomen: Soft, + BS.  Non tender, no guarding, rebound, hernias, masses. Lymphatics: Positive submandibular adenopathy Musculoskeletal: Full ROM, 5/5 strength, normal gait.  Skin: Warm, dry without rashes, lesions, ecchymosis.  Neuro: Cranial nerves intact. Normal muscle tone, no cerebellar symptoms. Sensation intact.  Psych: Awake and oriented X 3, normal affect, Insight and Judgment appropriate.   Ceruminosis is noted.  Wax is removed by syringing and manual debridement. Instructions for home care to prevent wax buildup are given.   Alycia Rossetti, NP 1:52 PM [facility]

## 2022-05-12 ENCOUNTER — Encounter: Payer: Self-pay | Admitting: Nurse Practitioner

## 2022-05-12 ENCOUNTER — Ambulatory Visit (INDEPENDENT_AMBULATORY_CARE_PROVIDER_SITE_OTHER): Payer: BC Managed Care – PPO | Admitting: Nurse Practitioner

## 2022-05-12 VITALS — BP 110/68 | HR 78 | Temp 97.7°F | Ht <= 58 in | Wt 133.6 lb

## 2022-05-12 DIAGNOSIS — E663 Overweight: Secondary | ICD-10-CM

## 2022-05-12 DIAGNOSIS — H6123 Impacted cerumen, bilateral: Secondary | ICD-10-CM | POA: Diagnosis not present

## 2022-05-12 DIAGNOSIS — J0110 Acute frontal sinusitis, unspecified: Secondary | ICD-10-CM | POA: Diagnosis not present

## 2022-05-12 MED ORDER — PROMETHAZINE-DM 6.25-15 MG/5ML PO SYRP: 5.0000 mL | ORAL_SOLUTION | Freq: Four times a day (QID) | ORAL | 1 refills | Status: DC | PRN

## 2022-05-12 MED ORDER — AZITHROMYCIN 250 MG PO TABS: ORAL_TABLET | ORAL | 1 refills | Status: DC

## 2022-05-12 MED ORDER — DEXAMETHASONE 1 MG PO TABS: ORAL_TABLET | ORAL | 0 refills | Status: DC

## 2022-05-12 MED ORDER — FLUTICASONE PROPIONATE 50 MCG/ACT NA SUSP: 2.0000 | Freq: Every day | NASAL | 2 refills | Status: DC

## 2022-05-12 NOTE — Patient Instructions (Signed)

## 2022-05-19 DIAGNOSIS — F429 Obsessive-compulsive disorder, unspecified: Secondary | ICD-10-CM | POA: Diagnosis not present

## 2022-05-19 DIAGNOSIS — F902 Attention-deficit hyperactivity disorder, combined type: Secondary | ICD-10-CM | POA: Diagnosis not present

## 2022-05-19 DIAGNOSIS — F70 Mild intellectual disabilities: Secondary | ICD-10-CM | POA: Diagnosis not present

## 2022-05-28 DIAGNOSIS — F71 Moderate intellectual disabilities: Secondary | ICD-10-CM | POA: Diagnosis not present

## 2022-06-18 ENCOUNTER — Ambulatory Visit (INDEPENDENT_AMBULATORY_CARE_PROVIDER_SITE_OTHER): Payer: BC Managed Care – PPO

## 2022-06-18 VITALS — Temp 97.9°F

## 2022-06-18 DIAGNOSIS — Z23 Encounter for immunization: Secondary | ICD-10-CM

## 2022-06-18 DIAGNOSIS — R6889 Other general symptoms and signs: Secondary | ICD-10-CM

## 2022-07-07 DIAGNOSIS — F429 Obsessive-compulsive disorder, unspecified: Secondary | ICD-10-CM | POA: Diagnosis not present

## 2022-07-07 DIAGNOSIS — F902 Attention-deficit hyperactivity disorder, combined type: Secondary | ICD-10-CM | POA: Diagnosis not present

## 2022-07-07 DIAGNOSIS — F70 Mild intellectual disabilities: Secondary | ICD-10-CM | POA: Diagnosis not present

## 2022-07-15 NOTE — Progress Notes (Unsigned)
COMPLETE PHYSICAL   Assessment / Plan:   Encounter for Annual Physical Exam with abnormal findings Due annually  Health Maintenance reviewed Healthy lifestyle reviewed and goals set  Intellectual disability Continue follow up with psych, mom is here with patient  Developmental delay Continue follow up with psych, mom is here with patient  Vitamin D deficiency Continue supplementation to maintain goal of 70-100 Taking Vitamin D 5,000 IU daily -     Vitamin D (25 hydroxy)  Hyperlipidemia, unspecified hyperlipidemia type -     Lipid Profile decrease fatty foods increase activity.   Attention deficit disorder, unspecified hyperactivity presence -  psych manages adderall; recently using only while at activity center per mother, limits use.   Obesity (BMI 30.0-34.9) - follow up 3 months for progress monitoring - increase veggies, decrease carbs - long discussion about weight loss, diet, and exercise - restart walking, snacking is issue, discussed with mom having healthy options in easy reach, place high calorie foods such as peanut butter out of reach  Anxiety Well managed by current regimen; continue medications Stress management techniques discussed, increase water, good sleep hygiene discussed, increase exercise, and increase veggies.  -Continue follow up psych  Arthritis, lumbar spine Continue follow up ortho, prn meloxicam, improved with hip replacements  Hip dysplasia, congenital S/P THR bilateral  Gastroesophageal reflux disease without esophagitis Continue PPI/H2 blocker, diet discussed  Abnormal glucose Continue diet and exercise -     Hemoglobin A1c ([facility])  Medication management Continued  Screening for hematuria or proteinuria -     Urinalysis, Routine w reflex microscopic  Screening for ischemic heart disease - EKG  . Further disposition pending results if labs check today. Discussed med's effects and SE's.   Over 30 minutes of face to face  interview, exam, counseling, chart review, and critical decision making was performed.   Future Appointments  Date Time Provider [place]  09/30/2022 10:00 AM Edgardo Roys, PsyD [facility] CPR  07/19/2023  9:00 AM Alycia Rossetti, NP [facility] None    HPI  46 y.o. female  presents for a complete physical. She has Vitamin D deficiency; ADD (attention deficit disorder); Intellectual disability; Hyperlipidemia; Anxiety; Acid reflux; Overweight (BMI 25.0-29.9); Arthritis, lumbar spine; Developmental delay; Fusion of spine of thoracolumbar region; At high risk for falls; Early menopause occurring in patient age younger than 10 years; Other specified disorders of bone density and structure, multiple sites; Seasonal allergies; and Abnormal nipple on their problem list.  She has developmental delay/intellectual disability, mother is her primary caregiver and accompanies her today. She follows with psych for behavioral issues (inattention, anxiety, skin picking). She [facility] to activity center 4 days a week. She follows with GYN Dr. Ulanda Edison annually. He follows for mammograms, no pelvics due to anatomy, never sexually active. Has had [place].   She has been more lethargic in the past 3 days. She denies any URI symptoms or GI symptoms. She will awaken in the middle of the night and be awake for a short time.   S/P right hip replacement at [facility] with Dr. Shaune Pascal on [id number]. She also had a fusion of spin thoracolumbar region 02/27/2019.  Had left hip replacement in 10/2019. She has done well since.   BMI is Body mass index is 30.25 kg/m., she has been working on exercise, she has not been walking since the weather is cold.  Wt Readings from Last 3 Encounters:  07/16/22 139 lb 12.8 oz (63.4 kg)  05/12/22 133 lb 9.6 oz (60.6 kg)  12/11/21 137 lb (62.1 kg)   Her blood pressure has been controlled at home, today their BP is BP: 112/70.   BP Readings from Last 3 Encounters:  07/16/22 112/70  05/12/22  110/68  12/11/21 120/78  She does workout. She denies chest pain, shortness of breath, dizziness.   She is not on cholesterol medication, working on lifestyle. Her cholesterol is not at goal. The cholesterol last visit was:  Lab Results  Component Value Date   CHOL 231 (H) 12/11/2021   HDL 82 12/11/2021   LDLCALC 133 (H) 12/11/2021   TRIG 70 12/11/2021   CHOLHDL 2.8 12/11/2021  . Her A1c has been controlled with diet/lifestyle. She denies any diabetic polys. Last A1C in the office was:  Lab Results  Component Value Date   HGBA1C 5.2 07/16/2021   Patient is on Vitamin D supplement,  5000 IU a day.   Lab Results  Component Value Date   VD25OH 35 07/16/2021        Current Medications:  Current Outpatient Medications on File Prior to Visit  Medication Sig Dispense Refill   acetaminophen (TYLENOL) 500 MG tablet Take by mouth.     amphetamine-dextroamphetamine (ADDERALL XR) 15 MG 24 hr capsule Take by mouth 3 (three) times a week.     aspirin EC 81 MG tablet Take by mouth once.      cetirizine (ZYRTEC) 10 MG tablet Take 10 mg by mouth daily.     CHOLECALCIFEROL PO Take 10,000 Units by mouth daily.     famotidine (PEPCID) 20 MG tablet Take 20 mg by mouth daily. Prior to breakfast.     FLUoxetine (PROZAC) 20 MG capsule Take 60 mg by mouth every morning.  5   fluticasone (FLONASE) 50 MCG/ACT nasal spray Place 2 sprays into both nostrils daily. 15.8 mL 2   Magnesium 250 MG TABS Take by mouth daily.     Multiple Vitamin (MULTIVITAMIN) tablet Take 1 tablet by mouth daily.     mupirocin ointment (BACTROBAN) 2 % Apply twice daily for 1-2 weeks. 22 g 2   topiramate (TOPAMAX) 100 MG tablet Take 100 mg by mouth 2 (two) times daily.     vitamin C (ASCORBIC ACID) 500 MG tablet Take 500 mg by mouth daily.     azithromycin (ZITHROMAX) 250 MG tablet Take 2 tablets (500 mg) on  Day 1,  followed by 1 tablet (250 mg) once daily on Days 2 through 5. (Patient not taking: Reported on 07/16/2022) 6 each  1   dexamethasone (DECADRON) 1 MG tablet Take 3 tabs for 3 days, 2 tabs for 3 days 1 tab for 5 days. Take with food. (Patient not taking: Reported on 07/16/2022) 20 tablet 0   fexofenadine (ALLEGRA) 180 MG tablet Take 180 mg by mouth daily. (Patient not taking: Reported on 05/12/2022)     promethazine-dextromethorphan (PROMETHAZINE-DM) 6.25-15 MG/5ML syrup Take 5 mLs by mouth 4 (four) times daily as needed for cough. (Patient not taking: Reported on 07/16/2022) 240 mL 1   No current facility-administered medications on file prior to visit.    Health Maintenance:   Immunization History  Administered Date(s) Administered   Influenza Inj Mdck Quad With Preservative 06/03/2017, 06/06/2018, 05/06/2020   Influenza Split 04/15/2015   Influenza,inj,Quad PF,6+ Mos 04/11/2019, 05/15/2021, 06/18/2022   Influenza,inj,quad, With Preservative 04/14/2016   Influenza-Unspecified 05/24/2014   PFIZER(Purple Top)SARS-COV-2 Vaccination 10/04/2019, 10/25/2019   PPD Test 02/19/2014   Tdap 02/07/2013   Tetanus: 2014 Flu: 04/2021 Pneumonia: n/a Covid 19: 2/2,  2021 Shingrix: discuss age 71 Covid 19: 2/2, pfizer  Pap: has not had related to anatomy, follows with Dr Ambrose Mantle - 09/2019 Mammogram: mother reports negative screening mammogram 10/15/2020 at GYN  Eye exam: Dr Jimmey Ralph last 2022, has scheduled 08/06/2021,  Dentist: Dr. Naomie Dean, last 2022, goes q67m  Patient Care Team: Lucky Cowboy, MD as PCP - General (Internal Medicine) Tracey Harries, MD as Consulting Physician (Obstetrics and Gynecology) Mat Carne, DO (Inactive) (Optometry) Lenn Sink, DPM as Consulting Physician (Podiatry)  Medical History:  Past Medical History:  Diagnosis Date   ADD (attention deficit disorder)    Hip dysplasia, congenital 01/25/2019   Hyperlipidemia    Mental retardation    OCD (obsessive compulsive disorder)    S/P hip replacement, bilateral 05/16/2019   Unspecified vitamin D deficiency     Allergies Allergies  Allergen Reactions   Fentanyl Other (See Comments)    O2 decreases   Maxitrol [Neomycin-Polymyxin-Dexameth]    Neomycin-Bacitracin Zn-Polymyx Swelling   Quetiapine Other (See Comments)    Over sedation     SURGICAL HISTORY She  has a past surgical history that includes Hip Arthroplasty (Left, 10/2019); scoliosis surgery; Eye surgery; and Hip Arthroplasty (Right, 05/01/2019).   FAMILY HISTORY Her family history includes Cancer in her mother; Diabetes in her maternal grandmother; Heart disease in her maternal grandmother and mother; Hyperlipidemia in her mother; Stroke in her maternal grandmother.   SOCIAL HISTORY She  reports that she has never smoked. She has never used smokeless tobacco. She reports that she does not drink alcohol and does not use drugs.  Review of Systems: Review of Systems  Constitutional:  Negative for malaise/fatigue and weight loss.  HENT:  Negative for hearing loss and tinnitus.   Eyes:  Negative for blurred vision and double vision.  Respiratory:  Negative for cough, sputum production, shortness of breath and wheezing.   Cardiovascular:  Negative for chest pain, palpitations, orthopnea, claudication, leg swelling and PND.  Gastrointestinal:  Negative for abdominal pain, blood in stool, constipation, diarrhea, heartburn, melena, nausea and vomiting.  Genitourinary: Negative.   Musculoskeletal:  Negative for falls, joint pain and myalgias.  Skin:  Negative for rash.  Neurological:  Negative for dizziness, tingling, sensory change, weakness and headaches.  Endo/Heme/Allergies:  Negative for polydipsia.  Psychiatric/Behavioral: Negative.  Negative for depression, memory loss, substance abuse and suicidal ideas. The patient is not nervous/anxious and does not have insomnia.   All other systems reviewed and are negative.   Physical Exam: Estimated body mass index is 30.25 kg/m as calculated from the following:   Height as of this  encounter: 4\' 9"  (1.448 m).   Weight as of this encounter: 139 lb 12.8 oz (63.4 kg). BP 112/70   Pulse 80   Temp 97.9 F (36.6 C)   Resp 16   Ht 4\' 9"  (1.448 m)   Wt 139 lb 12.8 oz (63.4 kg)   SpO2 99%   BMI 30.25 kg/m   General Appearance: Well nourished well developed, in no apparent distress.  Eyes: PERRLA, EOMs, conjunctiva no swelling or erythema, eyes wide set ENT/Mouth: Ear canals normal with moderate wet cerumen bilaterally, no swelling, erythema, or discharge.  TMs normal bilaterally with no erythema, bulging, retraction, or loss of landmark.  Oropharynx moist and clear with no exudate, erythema, or swelling.   Neck: Supple, thyroid normal. No bruits.  No cervical adenopathy Respiratory: Respiratory effort normal, Breath sounds clear A&P without wheeze, rhonchi, rales.   Cardio: RRR with 2+ murmur  heard best on LSB, no rubs or gallops. Brisk peripheral pulses without edema.  Abdomen: Soft, nontender, no guarding, rebound, hernias, masses, or organomegaly.  Lymphatics: Non tender without lymphadenopathy.  Musculoskeletal: Full ROM all peripheral extremities, 5/5 strength, and waddling gait. Moderate scoliosis to the right present on examination.  No kyphosis. Well healed surgical scar midline.  Skin: Warm, dry without rashes, lesions, ecchymosis. Neuro: Awake and oriented X 3, Cranial nerves intact, reflexes equal bilaterally. Normal muscle tone, no cerebellar symptoms. Sensation intact.  Psych:  normal affect for baseline, Insight and Judgment appropriate for patient.  Breasts: defer to gyn GU: Defer to GYN     EKG: NSR, no ST changes   Hammond Obeirne Mikki Santee, NP 9:14 AM [facility]

## 2022-07-16 ENCOUNTER — Encounter: Payer: Self-pay | Admitting: Nurse Practitioner

## 2022-07-16 ENCOUNTER — Encounter: Payer: BC Managed Care – PPO | Admitting: Adult Health

## 2022-07-16 ENCOUNTER — Ambulatory Visit (INDEPENDENT_AMBULATORY_CARE_PROVIDER_SITE_OTHER): Payer: BC Managed Care – PPO | Admitting: Nurse Practitioner

## 2022-07-16 VITALS — BP 112/70 | HR 80 | Temp 97.9°F | Resp 16 | Ht <= 58 in | Wt 139.8 lb

## 2022-07-16 DIAGNOSIS — Z13220 Encounter for screening for lipoid disorders: Secondary | ICD-10-CM

## 2022-07-16 DIAGNOSIS — Z0001 Encounter for general adult medical examination with abnormal findings: Secondary | ICD-10-CM

## 2022-07-16 DIAGNOSIS — F419 Anxiety disorder, unspecified: Secondary | ICD-10-CM

## 2022-07-16 DIAGNOSIS — Z136 Encounter for screening for cardiovascular disorders: Secondary | ICD-10-CM | POA: Diagnosis not present

## 2022-07-16 DIAGNOSIS — Z1389 Encounter for screening for other disorder: Secondary | ICD-10-CM

## 2022-07-16 DIAGNOSIS — M47816 Spondylosis without myelopathy or radiculopathy, lumbar region: Secondary | ICD-10-CM

## 2022-07-16 DIAGNOSIS — F988 Other specified behavioral and emotional disorders with onset usually occurring in childhood and adolescence: Secondary | ICD-10-CM

## 2022-07-16 DIAGNOSIS — F79 Unspecified intellectual disabilities: Secondary | ICD-10-CM

## 2022-07-16 DIAGNOSIS — Z0000 Encounter for general adult medical examination without abnormal findings: Secondary | ICD-10-CM

## 2022-07-16 DIAGNOSIS — R7309 Other abnormal glucose: Secondary | ICD-10-CM

## 2022-07-16 DIAGNOSIS — Q6589 Other specified congenital deformities of hip: Secondary | ICD-10-CM

## 2022-07-16 DIAGNOSIS — I10 Essential (primary) hypertension: Secondary | ICD-10-CM | POA: Diagnosis not present

## 2022-07-16 DIAGNOSIS — E785 Hyperlipidemia, unspecified: Secondary | ICD-10-CM

## 2022-07-16 DIAGNOSIS — Z131 Encounter for screening for diabetes mellitus: Secondary | ICD-10-CM | POA: Diagnosis not present

## 2022-07-16 DIAGNOSIS — E559 Vitamin D deficiency, unspecified: Secondary | ICD-10-CM | POA: Diagnosis not present

## 2022-07-16 DIAGNOSIS — Z79899 Other long term (current) drug therapy: Secondary | ICD-10-CM

## 2022-07-16 DIAGNOSIS — E669 Obesity, unspecified: Secondary | ICD-10-CM

## 2022-07-16 DIAGNOSIS — R6250 Unspecified lack of expected normal physiological development in childhood: Secondary | ICD-10-CM

## 2022-07-16 DIAGNOSIS — Z1329 Encounter for screening for other suspected endocrine disorder: Secondary | ICD-10-CM

## 2022-07-16 NOTE — Patient Instructions (Signed)

## 2022-07-17 LAB — URINALYSIS, ROUTINE W REFLEX MICROSCOPIC
Bilirubin Urine: NEGATIVE
Glucose, UA: NEGATIVE
Hgb urine dipstick: NEGATIVE
Ketones, ur: NEGATIVE
Leukocytes,Ua: NEGATIVE
Nitrite: NEGATIVE
Protein, ur: NEGATIVE
Specific Gravity, Urine: 1.023 (ref 1.001–1.035)
pH: 7 (ref 5.0–8.0)

## 2022-07-17 LAB — COMPLETE METABOLIC PANEL WITH GFR
AG Ratio: 1.7 (calc) (ref 1.0–2.5)
ALT: 16 U/L (ref 6–29)
AST: 33 U/L — ABNORMAL HIGH (ref 10–30)
Albumin: 4.3 g/dL (ref 3.6–5.1)
Alkaline phosphatase (APISO): 96 U/L (ref 31–125)
BUN: 22 mg/dL (ref 7–25)
CO2: 26 mmol/L (ref 20–32)
Calcium: 9.5 mg/dL (ref 8.6–10.2)
Chloride: 104 mmol/L (ref 98–110)
Creat: 0.77 mg/dL (ref 0.50–0.99)
Globulin: 2.6 g/dL (calc) (ref 1.9–3.7)
Glucose, Bld: 101 mg/dL — ABNORMAL HIGH (ref 65–99)
Potassium: 4.5 mmol/L (ref 3.5–5.3)
Sodium: 139 mmol/L (ref 135–146)
Total Bilirubin: 0.3 mg/dL (ref 0.2–1.2)
Total Protein: 6.9 g/dL (ref 6.1–8.1)
eGFR: 97 mL/min/{1.73_m2} (ref >=60)

## 2022-07-17 LAB — CBC WITH DIFFERENTIAL/PLATELET
Absolute Monocytes: 496 cells/uL (ref 200–950)
Basophils Absolute: 31 cells/uL (ref 0–200)
Basophils Relative: 0.5 %
Eosinophils Absolute: 267 cells/uL (ref 15–500)
Eosinophils Relative: 4.3 %
HCT: 40.3 % (ref 35.0–45.0)
Hemoglobin: 13.4 g/dL (ref 11.7–15.5)
Lymphs Abs: 1327 cells/uL (ref 850–3900)
MCH: 31.3 pg (ref 27.0–33.0)
MCHC: 33.3 g/dL (ref 32.0–36.0)
MCV: 94.2 fL (ref 80.0–100.0)
MPV: 9.6 fL (ref 7.5–12.5)
Monocytes Relative: 8 %
Neutro Abs: 4080 cells/uL (ref 1500–7800)
Neutrophils Relative %: 65.8 %
Platelets: 303 10*3/uL (ref 140–400)
RBC: 4.28 10*6/uL (ref 3.80–5.10)
RDW: 11.5 % (ref 11.0–15.0)
Total Lymphocyte: 21.4 %
WBC: 6.2 10*3/uL (ref 3.8–10.8)

## 2022-07-17 LAB — LIPID PANEL
Cholesterol: 226 mg/dL — ABNORMAL HIGH (ref <200)
HDL: 70 mg/dL (ref >=50)
LDL Cholesterol (Calc): 122 mg/dL (calc) — ABNORMAL HIGH
Non-HDL Cholesterol (Calc): 156 mg/dL (calc) — ABNORMAL HIGH (ref <130)
Total CHOL/HDL Ratio: 3.2 (calc) (ref <5.0)
Triglycerides: 226 mg/dL — ABNORMAL HIGH (ref <150)

## 2022-07-17 LAB — HEMOGLOBIN A1C
Hgb A1c MFr Bld: 5.7 % of total Hgb — ABNORMAL HIGH (ref <5.7)
Mean Plasma Glucose: 117 mg/dL
eAG (mmol/L): 6.5 mmol/L

## 2022-07-17 LAB — VITAMIN D 25 HYDROXY (VIT D DEFICIENCY, FRACTURES): Vit D, 25-Hydroxy: 33 ng/mL (ref 30–100)

## 2022-07-17 LAB — TSH: TSH: 0.57 mIU/L

## 2022-07-17 LAB — MAGNESIUM: Magnesium: 2 mg/dL (ref 1.5–2.5)

## 2022-08-06 DIAGNOSIS — Z1231 Encounter for screening mammogram for malignant neoplasm of breast: Secondary | ICD-10-CM | POA: Diagnosis not present

## 2022-08-06 DIAGNOSIS — R92333 Mammographic heterogeneous density, bilateral breasts: Secondary | ICD-10-CM | POA: Diagnosis not present

## 2022-08-07 ENCOUNTER — Other Ambulatory Visit: Payer: Self-pay | Admitting: Nurse Practitioner

## 2022-08-07 DIAGNOSIS — J0110 Acute frontal sinusitis, unspecified: Secondary | ICD-10-CM

## 2022-08-11 DIAGNOSIS — F902 Attention-deficit hyperactivity disorder, combined type: Secondary | ICD-10-CM | POA: Diagnosis not present

## 2022-08-11 DIAGNOSIS — F70 Mild intellectual disabilities: Secondary | ICD-10-CM | POA: Diagnosis not present

## 2022-08-11 DIAGNOSIS — F429 Obsessive-compulsive disorder, unspecified: Secondary | ICD-10-CM | POA: Diagnosis not present

## 2022-09-22 DIAGNOSIS — F429 Obsessive-compulsive disorder, unspecified: Secondary | ICD-10-CM | POA: Diagnosis not present

## 2022-09-22 DIAGNOSIS — F902 Attention-deficit hyperactivity disorder, combined type: Secondary | ICD-10-CM | POA: Diagnosis not present

## 2022-09-22 DIAGNOSIS — F70 Mild intellectual disabilities: Secondary | ICD-10-CM | POA: Diagnosis not present

## 2022-09-30 ENCOUNTER — Encounter: Payer: Medicaid Other | Admitting: Psychology

## 2022-10-30 DIAGNOSIS — Z780 Asymptomatic menopausal state: Secondary | ICD-10-CM | POA: Diagnosis not present

## 2022-11-24 DIAGNOSIS — H52223 Regular astigmatism, bilateral: Secondary | ICD-10-CM | POA: Diagnosis not present

## 2022-11-24 DIAGNOSIS — H524 Presbyopia: Secondary | ICD-10-CM | POA: Diagnosis not present

## 2022-11-24 DIAGNOSIS — H17822 Peripheral opacity of cornea, left eye: Secondary | ICD-10-CM | POA: Diagnosis not present

## 2022-11-24 DIAGNOSIS — H0102A Squamous blepharitis right eye, upper and lower eyelids: Secondary | ICD-10-CM | POA: Diagnosis not present

## 2022-11-24 DIAGNOSIS — H5213 Myopia, bilateral: Secondary | ICD-10-CM | POA: Diagnosis not present

## 2022-11-24 DIAGNOSIS — F429 Obsessive-compulsive disorder, unspecified: Secondary | ICD-10-CM | POA: Diagnosis not present

## 2022-11-24 DIAGNOSIS — H40052 Ocular hypertension, left eye: Secondary | ICD-10-CM | POA: Diagnosis not present

## 2022-11-24 DIAGNOSIS — Z961 Presence of intraocular lens: Secondary | ICD-10-CM | POA: Diagnosis not present

## 2022-11-24 DIAGNOSIS — H50112 Monocular exotropia, left eye: Secondary | ICD-10-CM | POA: Diagnosis not present

## 2022-11-24 DIAGNOSIS — F902 Attention-deficit hyperactivity disorder, combined type: Secondary | ICD-10-CM | POA: Diagnosis not present

## 2022-11-24 DIAGNOSIS — F70 Mild intellectual disabilities: Secondary | ICD-10-CM | POA: Diagnosis not present

## 2022-11-26 DIAGNOSIS — M8589 Other specified disorders of bone density and structure, multiple sites: Secondary | ICD-10-CM | POA: Diagnosis not present

## 2022-11-26 DIAGNOSIS — M810 Age-related osteoporosis without current pathological fracture: Secondary | ICD-10-CM | POA: Diagnosis not present

## 2022-11-26 DIAGNOSIS — M4325 Fusion of spine, thoracolumbar region: Secondary | ICD-10-CM | POA: Diagnosis not present

## 2022-11-26 DIAGNOSIS — Z780 Asymptomatic menopausal state: Secondary | ICD-10-CM | POA: Diagnosis not present

## 2023-01-13 NOTE — Progress Notes (Unsigned)
6 MONTH FOLLOW UP  Assessment and Plan:  Hyperlipidemia Currently moderate elevations without medication Strong preference to work on lifestyle LDL goal <100, but if persistently above 130 discuss statin Continue low cholesterol diet and exercise.  Check lipid panel, TSH  Abnormal glucose Last A1c mildly elevated will recheck at [name] Discussed diet/exercise, weight management  Defer A1C; check CMP  Overweight - BMI 29, with hld Long discussion about weight loss, diet, and exercise Recommended diet heavy in fruits and veggies and low in animal meats, cheeses, and dairy products, appropriate calorie intake Discussed ideal weight for height  Will follow up in 3 months - TSH  Vitamin D Def Below goal at last visit; she did increase supplement and had recheck at [facility] that was close to goal of 60; continue for now Defer Vit D level  ADD Continue medications Helps with focus, no AE's. The patient was counseled on the addictive nature of the medication and was encouraged to take drug holidays when not needed.   Anxiety Well managed by current regimen; continue medications Stress management techniques discussed, increase water, good sleep hygiene discussed, increase exercise, and increase veggies.    Intellectual disability/Developmental delay Continue to follow with psych NP Brock Bad.   Medication management -     CBC with Differential/Platelet -     COMPLETE METABOLIC PANEL WITH GFR -     Lipid panel -     TSH  Menopausal osteoporosis Continue to follow at [facility] Continue weight bearing exercises, Vit D and calcium Currently on Reclast drug holiday  Cellulitis of left lower extremity Counseled to try to avoid picking skin Keflex 500 mg BID x 5 days If does not heal notify the office -     cephALEXin (KEFLEX) 500 MG capsule; Take 1 capsule (500 mg total) by mouth 2 (two) times daily for 5 days.      Further disposition pending results of labs. Discussed  med's effects and SE's.   Over 30 minutes of exam, counseling, chart review, and critical decision making was performed.   Future Appointments  Date Time Provider Department Center  07/19/2023  9:00 AM Raynelle Dick, NP [facility] None    ------------------------------------------------------------------------------------------------------------------   HPI 47 y.o.female with ID presents accompanied by her mother 6 MOV. Mother gives most history.   She is managed by psych NP Brock Bad.  She is on topamax for anxiety. Patient is on an ADD medication (adderall XR 15 mg, only takes 5 days a week when she goes to [facility] program), she states that the medication is helping and she denies any adverse reactions. She has a diagnosis of anxiety and is currently on prozac 60 mg daily and symptoms are well controlled on current regimen.   She has chronic picking, has some lesions on left and right leg.  Areas are surrounded by redness.   She has been diagnosed with postmenopausal osteoporosis by Althea Grimmer NP and had been on Reclast infusions and is now to be on drug holiday and continue Calcium and vitamin D as well as weight bearing exercises.   BMI is Body mass index is 29.69 kg/m., she has not been walking as much due to heat.  She does get up in the middle of the night to snack- most every night.  Wt Readings from Last 3 Encounters:  01/14/23 137 lb 3.2 oz (62.2 kg)  07/16/22 139 lb 12.8 oz (63.4 kg)  05/12/22 133 lb 9.6 oz (60.6 kg)   Her blood  pressure has been controlled at home, today their BP is BP: 126/76  BP Readings from Last 3 Encounters:  01/14/23 126/76  07/16/22 112/70  05/12/22 110/68  She does workout. She denies chest pain, shortness of breath, dizziness.   She is not on cholesterol medication and denies myalgias. Her cholesterol is not at goal. The cholesterol last visit was:   Lab Results  Component Value Date   CHOL 226 (H) 07/16/2022   HDL 70  07/16/2022   LDLCALC 122 (H) 07/16/2022   TRIG 226 (H) 07/16/2022   CHOLHDL 3.2 07/16/2022    She has been working on diet and exercise for glucose management, and denies increased appetite, nausea, paresthesia of the feet, polydipsia and polyuria. Last A1C in the office was:  Lab Results  Component Value Date   HGBA1C 5.7 (H) 07/16/2022   Patient is on Vitamin D supplement, had recheck at [facility] on 09/02/2021 was 47 Lab Results  Component Value Date   VD25OH 33 07/16/2022       Immunization History  Administered Date(s) Administered   Influenza Inj Mdck Quad With Preservative 06/03/2017, 06/06/2018, 05/06/2020   Influenza Split 04/15/2015   Influenza,inj,Quad PF,6+ Mos 04/11/2019, 05/15/2021, 06/18/2022   Influenza,inj,quad, With Preservative 04/14/2016   Influenza-Unspecified 05/24/2014   PFIZER(Purple Top)SARS-COV-2 Vaccination 10/04/2019, 10/25/2019   PPD Test 02/19/2014   Tdap 02/07/2013     Past Medical History:  Diagnosis Date   ADD (attention deficit disorder)    Hip dysplasia, congenital 01/25/2019   Hyperlipidemia    Mental retardation    OCD (obsessive compulsive disorder)    S/P hip replacement, bilateral 05/16/2019   Unspecified vitamin D deficiency      Allergies  Allergen Reactions   Fentanyl Other (See Comments)    O2 decreases   Maxitrol [Neomycin-Polymyxin-Dexameth]    Neomycin-Bacitracin Zn-Polymyx Swelling   Quetiapine Other (See Comments)    Over sedation     Current Outpatient Medications on File Prior to Visit  Medication Sig   acetaminophen (TYLENOL) 500 MG tablet Take by mouth. PRN   amphetamine-dextroamphetamine (ADDERALL XR) 15 MG 24 hr capsule Take by mouth 3 (three) times a week.   CHOLECALCIFEROL PO Take 10,000 Units by mouth daily.   famotidine (PEPCID) 20 MG tablet Take 20 mg by mouth daily. Prior to breakfast.   fexofenadine (ALLEGRA) 180 MG tablet Take 180 mg by mouth daily.   FLUoxetine (PROZAC) 20 MG capsule Take 60 mg by mouth  every morning.   fluticasone (FLONASE) 50 MCG/ACT nasal spray SHAKE LIQUID AND USE 2 SPRAYS IN EACH NOSTRIL DAILY   Magnesium 250 MG TABS Take by mouth daily.   Multiple Vitamin (MULTIVITAMIN) tablet Take 1 tablet by mouth daily.   mupirocin ointment (BACTROBAN) 2 % Apply twice daily for 1-2 weeks.   topiramate (TOPAMAX) 100 MG tablet Take 100 mg by mouth 2 (two) times daily.   vitamin C (ASCORBIC ACID) 500 MG tablet Take 500 mg by mouth daily.   No current facility-administered medications on file prior to visit.    ROS: all negative except above.   Physical Exam:  BP 126/76   Pulse 67   Temp 97.9 F (36.6 C)   Ht 4\' 9"  (1.448 m)   Wt 137 lb 3.2 oz (62.2 kg)   SpO2 97%   BMI 29.69 kg/m   General Appearance: Well nourished, good hygiene, in no apparent distress.  Eyes: PERRLA, EOMs, conjunctiva no swelling or erythema, eyes wide set ENT/Mouth: Ear canals normal with cerument  obstruction bilaterally, swelling, erythema, or discharge.  TMs normal bilaterally with no erythema, bulging, retraction, or loss of landmark.  Oropharynx moist and clear with no exudate, erythema, or swelling.   Neck: Supple, thyroid normal. No bruits.  No cervical adenopathy Respiratory: Respiratory effort normal, Breath sounds clear A&P without wheeze, rhonchi, rales.   Cardio: RRR without murmur, no rubs or gallops. Brisk peripheral pulses with 1+ edema.   Abdomen: Soft, nontender, no guarding  Lymphatics: Non tender without lymphadenopathy.  Musculoskeletal: Full ROM all peripheral extremities,5/5 strength,  Scoliosis to the right present on examination.  No kyphosis. Slow waddling gait.  Skin: Warm, dry without rashes, ecchymosis. , R lower leg 2 small open areas with local erythema and scant purulent appearance to open wound.  Neuro: Awake and oriented to person, Normal muscle tone,  Sensation intact.  Psych:  normal affect for baseline, Insight and Judgment appropriate for patient.     Raynelle Dick, NP 3:13 PM [facility]

## 2023-01-14 ENCOUNTER — Encounter: Payer: Self-pay | Admitting: Nurse Practitioner

## 2023-01-14 ENCOUNTER — Ambulatory Visit (INDEPENDENT_AMBULATORY_CARE_PROVIDER_SITE_OTHER): Payer: BC Managed Care – PPO | Admitting: Nurse Practitioner

## 2023-01-14 VITALS — BP 126/76 | HR 67 | Temp 97.9°F | Ht <= 58 in | Wt 137.2 lb

## 2023-01-14 DIAGNOSIS — F988 Other specified behavioral and emotional disorders with onset usually occurring in childhood and adolescence: Secondary | ICD-10-CM

## 2023-01-14 DIAGNOSIS — R6250 Unspecified lack of expected normal physiological development in childhood: Secondary | ICD-10-CM

## 2023-01-14 DIAGNOSIS — F419 Anxiety disorder, unspecified: Secondary | ICD-10-CM

## 2023-01-14 DIAGNOSIS — E785 Hyperlipidemia, unspecified: Secondary | ICD-10-CM

## 2023-01-14 DIAGNOSIS — R7309 Other abnormal glucose: Secondary | ICD-10-CM

## 2023-01-14 DIAGNOSIS — E669 Obesity, unspecified: Secondary | ICD-10-CM

## 2023-01-14 DIAGNOSIS — Z79899 Other long term (current) drug therapy: Secondary | ICD-10-CM

## 2023-01-14 DIAGNOSIS — F79 Unspecified intellectual disabilities: Secondary | ICD-10-CM

## 2023-01-14 DIAGNOSIS — E559 Vitamin D deficiency, unspecified: Secondary | ICD-10-CM

## 2023-01-14 DIAGNOSIS — L03116 Cellulitis of left lower limb: Secondary | ICD-10-CM

## 2023-01-14 DIAGNOSIS — E66811 Obesity, class 1: Secondary | ICD-10-CM

## 2023-01-14 DIAGNOSIS — M810 Age-related osteoporosis without current pathological fracture: Secondary | ICD-10-CM

## 2023-01-14 MED ORDER — CEPHALEXIN 500 MG PO CAPS
500.0000 mg | ORAL_CAPSULE | Freq: Two times a day (BID) | ORAL | 0 refills | Status: AC
Start: 2023-01-14 — End: 2023-01-19

## 2023-01-14 NOTE — Patient Instructions (Signed)
Cellulitis, Adult  Cellulitis is a skin infection. The infected area is often warm, red, swollen, and sore. It occurs most often on the legs, feet, and toes, but can happen on any part of the body. This condition can be life-threatening without treatment. It is very important to get treated right away. What are the causes? This condition is caused by bacteria. The bacteria enter through a break in the skin, such as: A cut. A burn. A bug bite. An animal bite. An open sore. A crack. What increases the risk? Having a weak body's defense system (immune system). Being older than 45 years old. Having a blood sugar problem (diabetes). Having a long-term liver disease (cirrhosis) or kidney disease. Being very overweight (obese). Having a skin problem, such as: An itchy rash. A rash caused by a fungus. A rash with blisters. Slow movement of blood in the veins (venous stasis). Fluid buildup below the skin (edema). This condition is more likely to occur in people who: Have open cuts, burns, bites, or scrapes on the skin. Have been treated with high-energy rays (radiation). Use IV drugs. What are the signs or symptoms? Skin that: Looks red or purple, or slightly darker than your usual skin color. Has streaks. Has spots. Is swollen. Is sore or painful when you touch it. Is warm. A fever. Chills. Blisters. Tiredness (fatigue). How is this treated? Medicines to treat infections or allergies. Rest. Placing cold or warm cloths on the skin. Staying in the hospital, if the condition is very bad. You may need medicines through an IV. Follow these instructions at home: Medicines Take over-the-counter and prescription medicines only as told by your doctor. If you were prescribed antibiotics, take them as told by your doctor. Do not stop using them even if you start to feel better. General instructions Drink enough fluid to keep your pee (urine) pale yellow. Do not touch or rub the  infected area. Raise (elevate) the infected area above the level of your heart while you are sitting or lying down. Return to your normal activities when your doctor says that it is safe. Place cold or warm cloths on the area as told by your doctor. Keep all follow-up visits. Your doctor will need to make sure that a more serious infection is not developing. Contact a doctor if: You have a fever. You do not start to get better after 1-2 days of treatment. Your bone or joint under the infected area starts to hurt after the skin has healed. Your infection comes back in the same area or another area. Signs of this may include: You have a swollen bump in the area. Your red area gets larger, turns dark in color, or hurts more. You have more fluid coming from the wound. Pus or a bad smell develops in your infected area. You have more pain. You feel sick and have muscle aches and weakness. You develop vomiting or watery poop that will not go away. Get help right away if: You see red streaks coming from the area. You notice the skin turns purple or black and falls off. These symptoms may be an emergency. Get help right away. Call 911. Do not wait to see if the symptoms will go away. Do not drive yourself to the hospital. This information is not intended to replace advice given to you by your health care provider. Make sure you discuss any questions you have with your health care provider. Document Revised: 03/03/2022 Document Reviewed: 03/03/2022 Elsevier Patient Education    2024 Elsevier Inc.  

## 2023-01-15 LAB — COMPLETE METABOLIC PANEL WITH GFR
AG Ratio: 1.7 (calc) (ref 1.0–2.5)
ALT: 12 U/L (ref 6–29)
AST: 18 U/L (ref 10–35)
Albumin: 4.1 g/dL (ref 3.6–5.1)
Alkaline phosphatase (APISO): 83 U/L (ref 31–125)
BUN: 21 mg/dL (ref 7–25)
CO2: 27 mmol/L (ref 20–32)
Calcium: 9.4 mg/dL (ref 8.6–10.2)
Chloride: 104 mmol/L (ref 98–110)
Creat: 0.7 mg/dL (ref 0.50–0.99)
Globulin: 2.4 g/dL (calc) (ref 1.9–3.7)
Glucose, Bld: 117 mg/dL — ABNORMAL HIGH (ref 65–99)
Potassium: 4.9 mmol/L (ref 3.5–5.3)
Sodium: 137 mmol/L (ref 135–146)
Total Bilirubin: 0.2 mg/dL (ref 0.2–1.2)
Total Protein: 6.5 g/dL (ref 6.1–8.1)
eGFR: 109 mL/min/{1.73_m2} (ref >=60)

## 2023-01-15 LAB — CBC WITH DIFFERENTIAL/PLATELET
Absolute Monocytes: 536 cells/uL (ref 200–950)
Basophils Absolute: 47 cells/uL (ref 0–200)
Basophils Relative: 0.7 %
Eosinophils Absolute: 201 cells/uL (ref 15–500)
Eosinophils Relative: 3 %
HCT: 38.4 % (ref 35.0–45.0)
Hemoglobin: 12.5 g/dL (ref 11.7–15.5)
Lymphs Abs: 1414 cells/uL (ref 850–3900)
MCH: 30.4 pg (ref 27.0–33.0)
MCHC: 32.6 g/dL (ref 32.0–36.0)
MCV: 93.4 fL (ref 80.0–100.0)
MPV: 9.2 fL (ref 7.5–12.5)
Monocytes Relative: 8 %
Neutro Abs: 4502 cells/uL (ref 1500–7800)
Neutrophils Relative %: 67.2 %
Platelets: 373 10*3/uL (ref 140–400)
RBC: 4.11 10*6/uL (ref 3.80–5.10)
RDW: 11.8 % (ref 11.0–15.0)
Total Lymphocyte: 21.1 %
WBC: 6.7 10*3/uL (ref 3.8–10.8)

## 2023-01-15 LAB — LIPID PANEL
Cholesterol: 202 mg/dL — ABNORMAL HIGH (ref <200)
HDL: 72 mg/dL (ref >=50)
LDL Cholesterol (Calc): 114 mg/dL (calc) — ABNORMAL HIGH
Non-HDL Cholesterol (Calc): 130 mg/dL (calc) — ABNORMAL HIGH (ref <130)
Total CHOL/HDL Ratio: 2.8 (calc) (ref <5.0)
Triglycerides: 68 mg/dL (ref <150)

## 2023-01-15 LAB — TSH: TSH: 0.48 mIU/L

## 2023-01-26 DIAGNOSIS — F70 Mild intellectual disabilities: Secondary | ICD-10-CM | POA: Diagnosis not present

## 2023-01-26 DIAGNOSIS — F902 Attention-deficit hyperactivity disorder, combined type: Secondary | ICD-10-CM | POA: Diagnosis not present

## 2023-01-26 DIAGNOSIS — F429 Obsessive-compulsive disorder, unspecified: Secondary | ICD-10-CM | POA: Diagnosis not present

## 2023-02-10 ENCOUNTER — Encounter: Payer: Self-pay | Admitting: Nurse Practitioner

## 2023-02-10 ENCOUNTER — Ambulatory Visit (INDEPENDENT_AMBULATORY_CARE_PROVIDER_SITE_OTHER): Payer: BC Managed Care – PPO | Admitting: Nurse Practitioner

## 2023-02-10 VITALS — BP 110/78 | HR 67 | Temp 98.1°F | Ht <= 58 in | Wt 138.4 lb

## 2023-02-10 DIAGNOSIS — L539 Erythematous condition, unspecified: Secondary | ICD-10-CM | POA: Diagnosis not present

## 2023-02-10 DIAGNOSIS — L03116 Cellulitis of left lower limb: Secondary | ICD-10-CM

## 2023-02-10 MED ORDER — DOXYCYCLINE HYCLATE 100 MG PO TABS
100.0000 mg | ORAL_TABLET | Freq: Two times a day (BID) | ORAL | 0 refills | Status: AC
Start: 2023-02-10 — End: 2023-02-17

## 2023-02-10 NOTE — Patient Instructions (Signed)

## 2023-02-10 NOTE — Progress Notes (Signed)
Assessment and Plan:  Cathy Howard was seen today for an episodic visit.  Diagnoses and all order for this visit:  Left leg cellulitis/erythema Start tmt with abx. Restart Allegra daily for 1 week Continue topical Hydrocortisone 1% daily PRN. Continue to monitor for increase in spreading of redness, fever, chills, N/V. Contact office if noticed.  - doxycycline (VIBRA-TABS) 100 MG tablet; Take 1 tablet (100 mg total) by mouth 2 (two) times daily for 7 days.  Dispense: 14 tablet; Refill: 0  Notify office for further evaluation and treatment, questions or concerns if s/s fail to improve. The risks and benefits of my recommendations, as well as other treatment options were discussed with the patient today. Questions were answered.  Further disposition pending results of labs. Discussed med's effects and SE's.    Over 15 minutes of exam, counseling, chart review, and critical decision making was performed.   Future Appointments  Date Time Provider Department Center  07/19/2023  9:00 AM Raynelle Dick, NP [facility] None    ------------------------------------------------------------------------------------------------------------------   HPI BP 110/78   Pulse 67   Temp 98.1 F (36.7 C)   Ht 4\' 9"  (1.448 m)   Wt 138 lb 6.4 oz (62.8 kg)   SpO2 98%   BMI 29.95 kg/m   Subjective:    Cathy Howard is a 47 y.o. female who presents for evaluation of a possible skin infection located along medial calf of LLE. Symptoms include erythema located from mid calf to proximal ankle . Patient denies mild pain, chills, and fever greater than 100. Precipitating event: insect bite. Treatment to date has included  Hydrocortisone 1% topical cream  with minimal relief.  Past Medical History:  Diagnosis Date   ADD (attention deficit disorder)    Hip dysplasia, congenital 01/25/2019   Hyperlipidemia    Mental retardation    OCD (obsessive compulsive disorder)    S/P hip replacement, bilateral  05/16/2019   Unspecified vitamin D deficiency      Allergies  Allergen Reactions   Fentanyl Other (See Comments)    O2 decreases   Maxitrol [Neomycin-Polymyxin-Dexameth]    Neomycin-Bacitracin Zn-Polymyx Swelling   Quetiapine Other (See Comments)    Over sedation     Current Outpatient Medications on File Prior to Visit  Medication Sig   acetaminophen (TYLENOL) 500 MG tablet Take by mouth. PRN   amphetamine-dextroamphetamine (ADDERALL XR) 15 MG 24 hr capsule Take by mouth 5 days.   CHOLECALCIFEROL PO Take 10,000 Units by mouth daily.   famotidine (PEPCID) 20 MG tablet Take 20 mg by mouth daily. Prior to breakfast.   fexofenadine (ALLEGRA) 180 MG tablet Take 180 mg by mouth daily.   FLUoxetine (PROZAC) 20 MG capsule Take 60 mg by mouth every morning.   fluticasone (FLONASE) 50 MCG/ACT nasal spray SHAKE LIQUID AND USE 2 SPRAYS IN EACH NOSTRIL DAILY   Magnesium 250 MG TABS Take by mouth daily.   Multiple Vitamin (MULTIVITAMIN) tablet Take 1 tablet by mouth daily.   mupirocin ointment (BACTROBAN) 2 % Apply twice daily for 1-2 weeks.   topiramate (TOPAMAX) 100 MG tablet Take 100 mg by mouth 2 (two) times daily.   vitamin C (ASCORBIC ACID) 500 MG tablet Take 500 mg by mouth daily.   No current facility-administered medications on file prior to visit.    ROS: all negative except what is noted in the HPI.   Physical Exam:  BP 110/78   Pulse 67   Temp 98.1 F (36.7 C)   Ht 4'  9" (1.448 m)   Wt 138 lb 6.4 oz (62.8 kg)   SpO2 98%   BMI 29.95 kg/m   General Appearance: NAD.  Awake, conversant and cooperative. Eyes: PERRLA, EOMs intact.  Sclera white.  Conjunctiva without erythema. Sinuses: No frontal/maxillary tenderness.  No nasal discharge. Nares patent.  ENT/Mouth: Ext aud canals clear.  Bilateral TMs w/DOL and without erythema or bulging. Hearing intact.  Posterior pharynx without swelling or exudate.  Tonsils without swelling or erythema.  Neck: Supple.  No masses, nodules  or thyromegaly. Respiratory: Effort is regular with non-labored breathing. Breath sounds are equal bilaterally without rales, rhonchi, wheezing or stridor.  Cardio: RRR with no MRGs. Brisk peripheral pulses without edema.  Abdomen: Active BS in all four quadrants.  Soft and non-tender without guarding, rebound tenderness, hernias or masses. Lymphatics: Non tender without lymphadenopathy.  Musculoskeletal: Full ROM, 5/5 strength, normal ambulation.  No clubbing or cyanosis. Skin: LLE with slightly irregularly shaped round erythematous are along medial calf.  Warm to touch.  Appropriate color for ethnicity. Surrounding skin WNL Neuro: CN II-XII grossly normal. Normal muscle tone without cerebellar symptoms and intact sensation.   Psych: AO X 3,  appropriate mood and affect, insight and judgment.     Adela Glimpse, NP 3:00 PM [facility]

## 2023-02-11 ENCOUNTER — Ambulatory Visit: Payer: BC Managed Care – PPO | Admitting: Nurse Practitioner

## 2023-02-11 DIAGNOSIS — L03116 Cellulitis of left lower limb: Secondary | ICD-10-CM | POA: Diagnosis not present

## 2023-02-24 DIAGNOSIS — Z130 Encounter for screening for diseases of the blood and blood-forming organs and certain disorders involving the immune mechanism: Secondary | ICD-10-CM | POA: Diagnosis not present

## 2023-02-24 DIAGNOSIS — Z01419 Encounter for gynecological examination (general) (routine) without abnormal findings: Secondary | ICD-10-CM | POA: Diagnosis not present

## 2023-03-18 ENCOUNTER — Encounter: Payer: Self-pay | Admitting: Nurse Practitioner

## 2023-03-18 ENCOUNTER — Ambulatory Visit (INDEPENDENT_AMBULATORY_CARE_PROVIDER_SITE_OTHER): Payer: BC Managed Care – PPO | Admitting: Nurse Practitioner

## 2023-03-18 VITALS — BP 112/72 | HR 88 | Temp 97.7°F | Ht <= 58 in | Wt 135.6 lb

## 2023-03-18 DIAGNOSIS — R6250 Unspecified lack of expected normal physiological development in childhood: Secondary | ICD-10-CM | POA: Diagnosis not present

## 2023-03-18 DIAGNOSIS — R21 Rash and other nonspecific skin eruption: Secondary | ICD-10-CM

## 2023-03-18 DIAGNOSIS — E669 Obesity, unspecified: Secondary | ICD-10-CM | POA: Diagnosis not present

## 2023-03-18 LAB — CBC WITH DIFFERENTIAL/PLATELET
Absolute Monocytes: 409 cells/uL (ref 200–950)
Basophils Absolute: 28 cells/uL (ref 0–200)
Basophils Relative: 0.5 %
Eosinophils Absolute: 179 cells/uL (ref 15–500)
Eosinophils Relative: 3.2 %
HCT: 41 % (ref 35.0–45.0)
Hemoglobin: 13.4 g/dL (ref 11.7–15.5)
Lymphs Abs: 1428 cells/uL (ref 850–3900)
MCH: 30.7 pg (ref 27.0–33.0)
MCHC: 32.7 g/dL (ref 32.0–36.0)
MCV: 94 fL (ref 80.0–100.0)
MPV: 9.3 fL (ref 7.5–12.5)
Monocytes Relative: 7.3 %
Neutro Abs: 3556 cells/uL (ref 1500–7800)
Neutrophils Relative %: 63.5 %
Platelets: 311 10*3/uL (ref 140–400)
RBC: 4.36 10*6/uL (ref 3.80–5.10)
RDW: 11.5 % (ref 11.0–15.0)
Total Lymphocyte: 25.5 %
WBC: 5.6 10*3/uL (ref 3.8–10.8)

## 2023-03-18 NOTE — Progress Notes (Signed)
Assessment and Plan:  Irva was seen today for rash.  Diagnoses and all orders for this visit:  Developmental delay Continue to follow with psych NP Brock Bad.   Overweight Continue to try to decrease nighttime eating and snacking Increase activity if possible  Rash Evaluated and does not appear bacterial, viral, fungal or allergic Will continue to monitor area and determine if resolves spontaneously as it does not itch or hurt Follow up in 5 days, if becomes irritated, covers larger area or develops other areas before then notify the office or Urgent care if necessary -     CBC with Differential/Platelet       Further disposition pending results of labs. Discussed med's effects and SE's.   Over 30 minutes of exam, counseling, chart review, and critical decision making was performed.   Future Appointments  Date Time Provider Department Center  07/19/2023  9:00 AM Raynelle Dick, NP [facility] None    ------------------------------------------------------------------------------------------------------------------   HPI BP 112/72   Pulse 88   Temp 97.7 F (36.5 C)   Ht 4\' 9"  (1.448 m)   Wt 135 lb 9.6 oz (61.5 kg)   SpO2 95%   BMI 29.34 kg/m   47 y.o.female presents for rash on lower back on left side and one spot on right hip noticed today.  No itching or pain associated with it.    BP well controlled without medication BP Readings from Last 3 Encounters:  03/18/23 112/72  02/10/23 110/78  01/14/23 126/76  Denies headaches, chest pain, shortness of breath and dizziness   BMI is Body mass index is 29.34 kg/m., she has not been working on diet and exercise. Wt Readings from Last 3 Encounters:  03/18/23 135 lb 9.6 oz (61.5 kg)  02/10/23 138 lb 6.4 oz (62.8 kg)  01/14/23 137 lb 3.2 oz (62.2 kg)     Past Medical History:  Diagnosis Date   ADD (attention deficit disorder)    Hip dysplasia, congenital 01/25/2019   Hyperlipidemia    Mental retardation     OCD (obsessive compulsive disorder)    S/P hip replacement, bilateral 05/16/2019   Unspecified vitamin D deficiency      Allergies  Allergen Reactions   Fentanyl Other (See Comments)    O2 decreases   Maxitrol [Neomycin-Polymyxin-Dexameth]    Neomycin-Bacitracin Zn-Polymyx Swelling   Quetiapine Other (See Comments)    Over sedation     Current Outpatient Medications on File Prior to Visit  Medication Sig   acetaminophen (TYLENOL) 500 MG tablet Take by mouth. PRN   amphetamine-dextroamphetamine (ADDERALL XR) 15 MG 24 hr capsule Take by mouth 5 days.   aspirin EC 81 MG tablet Take 81 mg by mouth daily. Swallow whole.   CHOLECALCIFEROL PO Take 5,000 Units by mouth daily.   famotidine (PEPCID) 20 MG tablet Take 20 mg by mouth daily. Prior to breakfast.   fexofenadine (ALLEGRA) 180 MG tablet Take 180 mg by mouth daily.   FLUoxetine (PROZAC) 20 MG capsule Take 60 mg by mouth every morning.   fluticasone (FLONASE) 50 MCG/ACT nasal spray SHAKE LIQUID AND USE 2 SPRAYS IN EACH NOSTRIL DAILY   Magnesium 250 MG TABS Take by mouth daily.   Multiple Vitamin (MULTIVITAMIN) tablet Take 1 tablet by mouth daily.   mupirocin ointment (BACTROBAN) 2 % Apply twice daily for 1-2 weeks.   topiramate (TOPAMAX) 100 MG tablet Take 100 mg by mouth 2 (two) times daily.   vitamin C (ASCORBIC ACID) 500 MG tablet Take 500  mg by mouth daily.   No current facility-administered medications on file prior to visit.    ROS: all negative except above.   Physical Exam:  BP 112/72   Pulse 88   Temp 97.7 F (36.5 C)   Ht 4\' 9"  (1.448 m)   Wt 135 lb 9.6 oz (61.5 kg)   SpO2 95%   BMI 29.34 kg/m   General Appearance: Well nourished, in no apparent distress. Eyes: PERRLA, EOMs, conjunctiva no swelling or erythema Sinuses: No Frontal/maxillary tenderness ENT/Mouth: Ext aud canals clear, TMs without erythema, bulging. No erythema, swelling, or exudate on post pharynx. Hearing normal.  Neck: Supple, thyroid  normal.  Respiratory: Respiratory effort normal, BS equal bilaterally without rales, rhonchi, wheezing or stridor.  Cardio: RRR with no MRGs. Brisk peripheral pulses without edema.  Abdomen: Soft, + BS.  Non tender, no guarding, rebound, hernias, masses. Lymphatics: Non tender without lymphadenopathy.  Musculoskeletal:Full ROM all peripheral extremities,5/5 strength, Scoliosis to the right present on examination. No kyphosis. Slow waddling gait.  Skin: Warm, dry  Rash noted around waist, red not raised- see pictures below Neuro: Cranial nerves intact. Normal muscle tone, no cerebellar symptoms. Sensation intact.  Psych: Awake and oriented to person.Normal affect for baseline         Maddix Heinz Hollie Salk, NP 11:07 AM [facility]

## 2023-03-18 NOTE — Patient Instructions (Signed)
Rash, Adult  A rash is a breakout of spots or blotches on the skin. It can change the way your skin looks and feels. Many things can cause a rash. The goal of treatment is to stop the itching and keep the rash from spreading. Follow these instructions at home: Medicine Take or apply over-the-counter and prescription medicines only as told by your doctor. These may include medicines to treat: Red or swollen skin. Itching. An allergy. Pain. An infection.  Skin care Put a cool, wet cloth on the rash. Do not scratch or rub your skin. Try not to cover the rash. Keep it exposed to air as often as you can. Managing itching and discomfort Avoid hot showers or baths. These can make itching worse. A cold shower may help. Try taking a bath with: Epsom salts. You can get these at your pharmacy or grocery store. Follow the instructions on the package. Baking soda. Pour a small amount into the bath as told by your doctor. Colloidal oatmeal. You can get this at your pharmacy or grocery store. Follow the instructions on the package. Try putting baking soda paste on your skin. Stir water into baking soda until it gets like a paste. Try putting on a lotion to help with itching (calamine lotion). Keep cool. Stay out of the sun. Sweating and being hot can make itching worse. General instructions  Rest as needed. Drink enough fluid to keep your pee (urine) pale yellow. Wear loose-fitting clothes. Avoid scented soaps, detergents, and perfumes. Use gentle soaps, detergents, perfumes, and cosmetics. Avoid the things that cause your rash. Keep a journal to help keep track of what causes your rash. Write down: What you eat. What cosmetics you use. What you drink. What you wear. This includes jewelry. Contact a doctor if: You sweat a lot at night. You pee (urinate) more or less than normal. Your pee is a darker color than normal. Your eyes are sensitive to light. Your skin or the white parts of your  eyes turn yellow. Your skin tingles or is numb. You get painful blisters in your nose or mouth. Your rash does not go away after a few days, or it gets worse. You are more tired than normal. You are more thirsty than normal. You have new or worse symptoms. These may include: Pain in your belly. A fever. Watery poop (diarrhea). Vomiting. Weakness. Weight loss. Get help right away if: You start to feel mixed up (confused). You have a very bad headache or a stiff neck. You have very bad joint pain or stiffness. You get very sleepy or not responsive. You have a seizure. This information is not intended to replace advice given to you by your health care provider. Make sure you discuss any questions you have with your health care provider. Document Revised: 04/24/2022 Document Reviewed: 04/24/2022 Elsevier Patient Education  2024 Elsevier Inc.  

## 2023-03-23 ENCOUNTER — Ambulatory Visit (INDEPENDENT_AMBULATORY_CARE_PROVIDER_SITE_OTHER): Payer: BC Managed Care – PPO | Admitting: Nurse Practitioner

## 2023-03-23 ENCOUNTER — Encounter: Payer: Self-pay | Admitting: Nurse Practitioner

## 2023-03-23 VITALS — BP 120/62 | HR 74 | Temp 98.1°F | Ht <= 58 in | Wt 137.4 lb

## 2023-03-23 DIAGNOSIS — R21 Rash and other nonspecific skin eruption: Secondary | ICD-10-CM

## 2023-03-23 DIAGNOSIS — R6250 Unspecified lack of expected normal physiological development in childhood: Secondary | ICD-10-CM

## 2023-03-23 DIAGNOSIS — E669 Obesity, unspecified: Secondary | ICD-10-CM

## 2023-03-23 DIAGNOSIS — E66811 Obesity, class 1: Secondary | ICD-10-CM

## 2023-03-23 NOTE — Progress Notes (Signed)
Assessment and Plan:  Cathy Howard was seen today for reevaluation of rash.  Diagnoses and all orders for this visit:  Developmental delay Continue to follow with psych NP Brock Bad.   Overweight Continue to try to decrease nighttime eating and snacking Increase activity if possible  Rash Improved and fading Notify office if symptoms worsen       Further disposition pending results of labs. Discussed med's effects and SE's.   Over 30 minutes of exam, counseling, chart review, and critical decision making was performed.   Future Appointments  Date Time Provider Department Center  07/19/2023  9:00 AM Raynelle Dick, NP [facility] None    ------------------------------------------------------------------------------------------------------------------   HPI BP 120/62   Pulse 74   Temp 98.1 F (36.7 C)   Ht 4\' 9"  (1.448 m)   Wt 137 lb 6.4 oz (62.3 kg)   SpO2 97%   BMI 29.73 kg/m   47 y.o.female presents for reevaluation of rash 5 days ago.    BP well controlled without medication BP Readings from Last 3 Encounters:  03/23/23 120/62  03/18/23 112/72  02/10/23 110/78  Denies headaches, chest pain, shortness of breath and dizziness   BMI is Body mass index is 29.73 kg/m., she has not been working on diet and exercise. Wt Readings from Last 3 Encounters:  03/23/23 137 lb 6.4 oz (62.3 kg)  03/18/23 135 lb 9.6 oz (61.5 kg)  02/10/23 138 lb 6.4 oz (62.8 kg)     Past Medical History:  Diagnosis Date   ADD (attention deficit disorder)    Hip dysplasia, congenital 01/25/2019   Hyperlipidemia    Mental retardation    OCD (obsessive compulsive disorder)    S/P hip replacement, bilateral 05/16/2019   Unspecified vitamin D deficiency      Allergies  Allergen Reactions   Fentanyl Other (See Comments)    O2 decreases   Maxitrol [Neomycin-Polymyxin-Dexameth]    Neomycin-Bacitracin Zn-Polymyx Swelling   Quetiapine Other (See Comments)    Over sedation      Current Outpatient Medications on File Prior to Visit  Medication Sig   acetaminophen (TYLENOL) 500 MG tablet Take by mouth. PRN   amphetamine-dextroamphetamine (ADDERALL XR) 15 MG 24 hr capsule Take by mouth 5 days.   aspirin EC 81 MG tablet Take 81 mg by mouth daily. Swallow whole.   CHOLECALCIFEROL PO Take 5,000 Units by mouth daily.   famotidine (PEPCID) 20 MG tablet Take 20 mg by mouth daily. Prior to breakfast.   fexofenadine (ALLEGRA) 180 MG tablet Take 180 mg by mouth daily.   FLUoxetine (PROZAC) 20 MG capsule Take 60 mg by mouth every morning.   fluticasone (FLONASE) 50 MCG/ACT nasal spray SHAKE LIQUID AND USE 2 SPRAYS IN EACH NOSTRIL DAILY   Magnesium 250 MG TABS Take by mouth daily.   Multiple Vitamin (MULTIVITAMIN) tablet Take 1 tablet by mouth daily.   topiramate (TOPAMAX) 100 MG tablet Take 100 mg by mouth 2 (two) times daily.   vitamin C (ASCORBIC ACID) 500 MG tablet Take 500 mg by mouth daily.   mupirocin ointment (BACTROBAN) 2 % Apply twice daily for 1-2 weeks. (Patient not taking: Reported on 03/23/2023)   No current facility-administered medications on file prior to visit.    ROS: all negative except above.   Physical Exam:  BP 120/62   Pulse 74   Temp 98.1 F (36.7 C)   Ht 4\' 9"  (1.448 m)   Wt 137 lb 6.4 oz (62.3 kg)   SpO2 97%  BMI 29.73 kg/m   General Appearance: Well nourished, in no apparent distress. Eyes: PERRLA, EOMs, conjunctiva no swelling or erythema Neck: Supple, thyroid normal.  Respiratory: Respiratory effort normal, BS equal bilaterally without rales, rhonchi, wheezing or stridor.  Cardio: RRR with no MRGs. Brisk peripheral pulses without edema.  Abdomen: Soft, + BS.  Non tender, no guarding, rebound, hernias, masses. Musculoskeletal:Full ROM all peripheral extremities,5/5 strength, Scoliosis to the right present on examination. No kyphosis. Slow waddling gait.  Skin: Warm, dry  Rash is fading at waistline and almost resolved Neuro:  Cranial nerves intact. Normal muscle tone, no cerebellar symptoms. Sensation intact.  Psych: Awake and oriented to person.Normal affect for baseline      Raynelle Dick, NP 11:47 AM [facility]

## 2023-03-23 NOTE — Patient Instructions (Signed)

## 2023-03-30 DIAGNOSIS — F429 Obsessive-compulsive disorder, unspecified: Secondary | ICD-10-CM | POA: Diagnosis not present

## 2023-03-30 DIAGNOSIS — F70 Mild intellectual disabilities: Secondary | ICD-10-CM | POA: Diagnosis not present

## 2023-03-30 DIAGNOSIS — F902 Attention-deficit hyperactivity disorder, combined type: Secondary | ICD-10-CM | POA: Diagnosis not present

## 2023-05-24 ENCOUNTER — Ambulatory Visit (INDEPENDENT_AMBULATORY_CARE_PROVIDER_SITE_OTHER): Payer: BC Managed Care – PPO

## 2023-05-24 VITALS — Temp 98.2°F

## 2023-05-24 DIAGNOSIS — Z23 Encounter for immunization: Secondary | ICD-10-CM

## 2023-06-01 DIAGNOSIS — F902 Attention-deficit hyperactivity disorder, combined type: Secondary | ICD-10-CM | POA: Diagnosis not present

## 2023-07-18 NOTE — Progress Notes (Unsigned)
COMPLETE PHYSICAL   Assessment / Plan:   Encounter for Annual Physical Exam with abnormal findings Due annually  Health Maintenance reviewed Healthy lifestyle reviewed and goals set Mammogram scheduled for 08/14/23  Intellectual disability Continue follow up with psych, mom is here with patient Given number for Deatra Robinson Psych NP in [place] in case current NP retires  Developmental delay Continue follow up with psych, mom is here with patient  Vitamin D deficiency Continue supplementation to maintain goal of 70-100 Taking Vitamin D 5,000 IU daily -     Vitamin D (25 hydroxy)  Hyperlipidemia, unspecified hyperlipidemia type -     Lipid Profile decrease fatty foods increase activity.   Attention deficit disorder, unspecified hyperactivity presence -  psych manages adderall; recently using only while at activity center per mother, limits use.   Obesity (BMI 30.0-34.9) - follow up 3 months for progress monitoring - increase veggies, decrease carbs - long discussion about weight loss, diet, and exercise - restart walking, snacking is issue, discussed with mom having healthy options in easy reach, place high calorie foods such as peanut butter out of reach  Anxiety Well managed by current regimen; continue medications Stress management techniques discussed, increase water, good sleep hygiene discussed, increase exercise, and increase veggies.  -Continue follow up psych  Arthritis, lumbar spine Continue follow up ortho, prn meloxicam, improved with hip replacements  Hip dysplasia, congenital S/P THR bilateral  Gastroesophageal reflux disease without esophagitis Continue PPI/H2 blocker, diet discussed  Abnormal glucose Continue diet and exercise -     Hemoglobin A1c ([facility])  Allergic rhinitis Allegra and Flonase regularly for the next 2 weeks If no improvement notify the office   Medication management -     CBC with Differential/Platelet -     COMPLETE  METABOLIC PANEL WITH GFR -     Magnesium -     Lipid panel -     TSH -     Hemoglobin A1C w/out eAG -     VITAMIN D 25 Hydroxy (Vit-D Deficiency, Fractures) -     EKG 12-Lead -     Urinalysis, Routine w reflex microscopic -     Microalbumin / creatinine urine ratio  Screening for thyroid disorder -     TSH  Screening for ischemic heart disease -     EKG 12-Lead  Screening for hematuria or proteinuria -     Urinalysis, Routine w reflex microscopic -     Microalbumin / creatinine urine ratio  Screening for colon cancer -     Ambulatory referral to Gastroenterology   Laceration of arm, left, initial encounter Keep area clean and dry -     Td : Tetanus/diphtheria >7yo Preservative  free   Osteoporosis Continue Vit D and weight bearing exercise Follows with Orvan Falconer NP orthopedics- currently on Reclast holiday  . Further disposition pending results if labs check today. Discussed med's effects and SE's.   Over 30 minutes of face to face interview, exam, counseling, chart review, and critical decision making was performed.   Future Appointments  Date Time Provider Department Center  07/18/2024  9:00 AM Raynelle Dick, NP [facility] None    HPI  47 y.o. female  presents for a complete physical. She has Vitamin D deficiency; ADD (attention deficit disorder); Intellectual disability; Hyperlipidemia; Anxiety; Acid reflux; Overweight (BMI 25.0-29.9); Arthritis, lumbar spine; Developmental delay; Fusion of spine of thoracolumbar region; At high risk for falls; Early menopause occurring in patient age younger than 45 years;  Other specified disorders of bone density and structure, multiple sites; Seasonal allergies; and Abnormal nipple on their problem list.   She has been experiencing dry cough and nasal congestion of clear nasal drainage.   She has a 1 on 1 person working with her at work that started in 04/2023. They have not had a fill in the whole month of December.   She  has developmental delay/intellectual disability, mother is her primary caregiver and accompanies her today. She follows with psych for behavioral issues (inattention, anxiety, skin picking). She goes to activity center 4 days a week. She follows with Eartha Inch NP annually- next appt 07/2023. He follows for mammograms, no pelvics due to anatomy, never sexually active. Has had [place]. Uncertain if Bonita Quin the NP she sees may be retiring.   S/P right hip replacement at [facility] with Dr. Thurnell Lose on 16/04/9603. She also had a fusion of spin thoracolumbar region 02/27/2019.  Had left hip replacement in 10/2019. She is having some left leg pain that she describes as aching that radiates down her leg  BMI is Body mass index is 29.91 kg/m., she has been working on exercise, she has not been walking since the weather is cold. She continues to get up at night and eat.  Wt Readings from Last 3 Encounters:  07/19/23 138 lb 3.2 oz (62.7 kg)  03/23/23 137 lb 6.4 oz (62.3 kg)  03/18/23 135 lb 9.6 oz (61.5 kg)   Her blood pressure has been controlled at home, today their BP is BP: 122/66.   BP Readings from Last 3 Encounters:  07/19/23 122/66  03/23/23 120/62  03/18/23 112/72  She does workout. She denies chest pain, shortness of breath, dizziness.   She is not on cholesterol medication, working on lifestyle. Her cholesterol is not at goal. The cholesterol last visit was:  Lab Results  Component Value Date   CHOL 202 (H) 01/14/2023   HDL 72 01/14/2023   LDLCALC 114 (H) 01/14/2023   TRIG 68 01/14/2023   CHOLHDL 2.8 01/14/2023  . Her A1c has been controlled with diet/lifestyle. She denies any diabetic polys. Last A1C in the office was:  Lab Results  Component Value Date   HGBA1C 5.7 (H) 07/16/2022   Patient is on Vitamin D supplement,  5000 IU a day.   Lab Results  Component Value Date   VD25OH 33 07/16/2022     She has a small open area on left arm , uncertain of cause.  She does continue to pick it but no signs  or symptoms of infection. Tetanus is not up to date.   She does continue to be followed with her osteoporosis and had 3 yearly Reclast injections and is currently on drug holiday    Current Medications:  Current Outpatient Medications on File Prior to Visit  Medication Sig Dispense Refill   acetaminophen (TYLENOL) 500 MG tablet Take by mouth. PRN     amphetamine-dextroamphetamine (ADDERALL XR) 15 MG 24 hr capsule Take by mouth 5 days.     CHOLECALCIFEROL PO Take 5,000 Units by mouth daily.     famotidine (PEPCID) 20 MG tablet Take 20 mg by mouth daily. Prior to breakfast.     fexofenadine (ALLEGRA) 180 MG tablet Take 180 mg by mouth daily.     FLUoxetine (PROZAC) 20 MG capsule Take 60 mg by mouth every morning.  5   fluticasone (FLONASE) 50 MCG/ACT nasal spray SHAKE LIQUID AND USE 2 SPRAYS IN EACH NOSTRIL DAILY 16 g 2  Magnesium 250 MG TABS Take by mouth daily.     Multiple Vitamin (MULTIVITAMIN) tablet Take 1 tablet by mouth daily.     mupirocin ointment (BACTROBAN) 2 % Apply twice daily for 1-2 weeks. 22 g 2   topiramate (TOPAMAX) 100 MG tablet Take 100 mg by mouth 2 (two) times daily.     vitamin C (ASCORBIC ACID) 500 MG tablet Take 500 mg by mouth daily.     aspirin EC 81 MG tablet Take 81 mg by mouth daily. Swallow whole.     No current facility-administered medications on file prior to visit.    Health Maintenance:   Immunization History  Administered Date(s) Administered   Fluzone Influenza virus vaccine,trivalent (IIV3), split virus 05/24/2023   Influenza Inj Mdck Quad With Preservative 06/03/2017, 06/06/2018, 05/06/2020   Influenza Split 04/15/2015   Influenza,inj,Quad PF,6+ Mos 04/11/2019, 05/15/2021, 06/18/2022   Influenza,inj,quad, With Preservative 04/14/2016   Influenza-Unspecified 05/24/2014   PFIZER(Purple Top)SARS-COV-2 Vaccination 10/04/2019, 10/25/2019   PPD Test 02/19/2014   Tdap 02/07/2013     Pap: has not had related to anatomy, follows with Eve-  02/2023 Mammogram: mother reports negative screening mammogram 02/2023 mammogram  Eye exam: Dr Jimmey Ralph last 2022, has scheduled 08/06/2021,  Dentist: Dr. Naomie Dean, last 2022, goes q69m  Patient Care Team: Lucky Cowboy, MD as PCP - General (Internal Medicine) Tracey Harries, MD as Consulting Physician (Obstetrics and Gynecology) Mat Carne, DO (Inactive) (Optometry) Regal, Kirstie Peri, DPM as Consulting Physician (Podiatry)  Medical History:  Past Medical History:  Diagnosis Date   ADD (attention deficit disorder)    Hip dysplasia, congenital 01/25/2019   Hyperlipidemia    Mental retardation    OCD (obsessive compulsive disorder)    S/P hip replacement, bilateral 05/16/2019   Unspecified vitamin D deficiency    Allergies Allergies  Allergen Reactions   Fentanyl Other (See Comments)    O2 decreases   Maxitrol [Neomycin-Polymyxin-Dexameth]    Neomycin-Bacitracin Zn-Polymyx Swelling   Quetiapine Other (See Comments)    Over sedation     SURGICAL HISTORY She  has a past surgical history that includes Hip Arthroplasty (Left, 10/2019); scoliosis surgery; Eye surgery; and Hip Arthroplasty (Right, 05/01/2019).   FAMILY HISTORY Her family history includes Cancer in her mother; Diabetes in her maternal grandmother; Heart disease in her maternal grandmother and mother; Hyperlipidemia in her mother; Stroke in her maternal grandmother.   SOCIAL HISTORY She  reports that she has never smoked. She has never used smokeless tobacco. She reports that she does not drink alcohol and does not use drugs.  Review of Systems: Review of Systems  Constitutional:  Negative for malaise/fatigue and weight loss.  HENT:  Negative for hearing loss and tinnitus.   Eyes:  Negative for blurred vision and double vision.  Respiratory:  Negative for cough, sputum production, shortness of breath and wheezing.   Cardiovascular:  Negative for chest pain, palpitations, orthopnea, claudication, leg swelling and  PND.  Gastrointestinal:  Negative for abdominal pain, blood in stool, constipation, diarrhea, heartburn, melena, nausea and vomiting.  Genitourinary: Negative.   Musculoskeletal:  Positive for joint pain (left hip/ leg). Negative for falls and myalgias.  Skin:  Positive for rash (small areas on chest and back- not infected).       Small left forearm laceration  Neurological:  Negative for dizziness, tingling, sensory change, weakness and headaches.  Endo/Heme/Allergies:  Negative for polydipsia.  Psychiatric/Behavioral: Negative.  Negative for depression, memory loss, substance abuse and suicidal ideas. The patient is not  nervous/anxious and does not have insomnia.   All other systems reviewed and are negative.   Physical Exam: Estimated body mass index is 29.91 kg/m as calculated from the following:   Height as of this encounter: 4\' 9"  (1.448 m).   Weight as of this encounter: 138 lb 3.2 oz (62.7 kg). BP 122/66   Pulse 75   Temp 97.9 F (36.6 C)   Ht 4\' 9"  (1.448 m)   Wt 138 lb 3.2 oz (62.7 kg)   SpO2 98%   BMI 29.91 kg/m   General Appearance: Well nourished well developed, in no apparent distress.  Eyes: PERRLA, EOMs, conjunctiva no swelling or erythema, eyes wide set ENT/Mouth: Ear canals normal with moderate wet cerumen bilaterally, no swelling, erythema, or discharge.  TMs normal bilaterally with no erythema, bulging, retraction, or loss of landmark.  Oropharynx moist and clear with no exudate, erythema, or swelling.   Neck: Supple, thyroid normal. No bruits.  No cervical adenopathy Respiratory: Respiratory effort normal, Breath sounds clear A&P without wheeze, rhonchi, rales.   Cardio: RRR with 2+ murmur heard best on LSB, no rubs or gallops. Brisk peripheral pulses without edema.  Abdomen: Soft, nontender, no guarding, rebound, hernias, masses, or organomegaly.  Lymphatics: Non tender without lymphadenopathy.  Musculoskeletal: Full ROM all peripheral extremities, 5/5  strength, and waddling gait. Moderate scoliosis to the right present on examination.  No kyphosis. Well healed surgical scar midline.  Skin: Warm, dry . Small folliculitis of chest/back. 2-3 cm scabbed laceration of left forearm, no signs of infection Neuro: Awake and oriented X 3, Cranial nerves intact, reflexes equal bilaterally. Normal muscle tone, no cerebellar symptoms. Sensation intact.  Psych:  normal affect for baseline, Insight and Judgment appropriate for patient.  Breasts: defer to gyn GU: Defer to GYN     EKG: NSR, no ST changes   Miller Limehouse Hollie Salk, NP 9:22 AM [facility]

## 2023-07-19 ENCOUNTER — Ambulatory Visit (INDEPENDENT_AMBULATORY_CARE_PROVIDER_SITE_OTHER): Payer: BC Managed Care – PPO | Admitting: Nurse Practitioner

## 2023-07-19 ENCOUNTER — Other Ambulatory Visit: Payer: Self-pay | Admitting: Nurse Practitioner

## 2023-07-19 ENCOUNTER — Encounter: Payer: Self-pay | Admitting: Nurse Practitioner

## 2023-07-19 VITALS — BP 122/66 | HR 75 | Temp 97.9°F | Ht <= 58 in | Wt 138.2 lb

## 2023-07-19 DIAGNOSIS — Z131 Encounter for screening for diabetes mellitus: Secondary | ICD-10-CM

## 2023-07-19 DIAGNOSIS — Z13220 Encounter for screening for lipoid disorders: Secondary | ICD-10-CM

## 2023-07-19 DIAGNOSIS — I10 Essential (primary) hypertension: Secondary | ICD-10-CM | POA: Diagnosis not present

## 2023-07-19 DIAGNOSIS — Z23 Encounter for immunization: Secondary | ICD-10-CM

## 2023-07-19 DIAGNOSIS — Z0001 Encounter for general adult medical examination with abnormal findings: Secondary | ICD-10-CM

## 2023-07-19 DIAGNOSIS — J302 Other seasonal allergic rhinitis: Secondary | ICD-10-CM

## 2023-07-19 DIAGNOSIS — F900 Attention-deficit hyperactivity disorder, predominantly inattentive type: Secondary | ICD-10-CM

## 2023-07-19 DIAGNOSIS — E559 Vitamin D deficiency, unspecified: Secondary | ICD-10-CM

## 2023-07-19 DIAGNOSIS — Z136 Encounter for screening for cardiovascular disorders: Secondary | ICD-10-CM | POA: Diagnosis not present

## 2023-07-19 DIAGNOSIS — R7309 Other abnormal glucose: Secondary | ICD-10-CM

## 2023-07-19 DIAGNOSIS — Z79899 Other long term (current) drug therapy: Secondary | ICD-10-CM | POA: Diagnosis not present

## 2023-07-19 DIAGNOSIS — S41112A Laceration without foreign body of left upper arm, initial encounter: Secondary | ICD-10-CM

## 2023-07-19 DIAGNOSIS — E66811 Obesity, class 1: Secondary | ICD-10-CM

## 2023-07-19 DIAGNOSIS — Z1389 Encounter for screening for other disorder: Secondary | ICD-10-CM

## 2023-07-19 DIAGNOSIS — M810 Age-related osteoporosis without current pathological fracture: Secondary | ICD-10-CM

## 2023-07-19 DIAGNOSIS — Z0000 Encounter for general adult medical examination without abnormal findings: Secondary | ICD-10-CM | POA: Diagnosis not present

## 2023-07-19 DIAGNOSIS — E785 Hyperlipidemia, unspecified: Secondary | ICD-10-CM

## 2023-07-19 DIAGNOSIS — R6250 Unspecified lack of expected normal physiological development in childhood: Secondary | ICD-10-CM

## 2023-07-19 DIAGNOSIS — Z1329 Encounter for screening for other suspected endocrine disorder: Secondary | ICD-10-CM

## 2023-07-19 DIAGNOSIS — J0110 Acute frontal sinusitis, unspecified: Secondary | ICD-10-CM

## 2023-07-19 DIAGNOSIS — K219 Gastro-esophageal reflux disease without esophagitis: Secondary | ICD-10-CM

## 2023-07-19 DIAGNOSIS — M79605 Pain in left leg: Secondary | ICD-10-CM

## 2023-07-19 DIAGNOSIS — Z1211 Encounter for screening for malignant neoplasm of colon: Secondary | ICD-10-CM

## 2023-07-19 DIAGNOSIS — F419 Anxiety disorder, unspecified: Secondary | ICD-10-CM

## 2023-07-19 DIAGNOSIS — F79 Unspecified intellectual disabilities: Secondary | ICD-10-CM

## 2023-07-19 MED ORDER — FLUTICASONE PROPIONATE 50 MCG/ACT NA SUSP: 2.0000 | Freq: Every day | NASAL | 1 refills | Status: DC

## 2023-07-19 NOTE — Patient Instructions (Signed)
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 She graduated with honors from [employer] in 2006. Having more than 18 years of diverse experiences, especially in NURSE PRACTITIONER, Cathy Howard affiliates with no hospital, cooperates with many other  doctors and specialists in medical group [facility] [employer]. Call Cathy Howard on phone number [phone] for more information and advice or to book an appointment.   [address], [place], [place] [address]  [phone]  [phone] See other contact addresses

## 2023-07-20 LAB — COMPLETE METABOLIC PANEL WITH GFR
AG Ratio: 1.7 (calc) (ref 1.0–2.5)
ALT: 13 U/L (ref 6–29)
AST: 22 U/L (ref 10–35)
Albumin: 4.2 g/dL (ref 3.6–5.1)
Alkaline phosphatase (APISO): 86 U/L (ref 31–125)
BUN: 19 mg/dL (ref 7–25)
CO2: 25 mmol/L (ref 20–32)
Calcium: 9.3 mg/dL (ref 8.6–10.2)
Chloride: 106 mmol/L (ref 98–110)
Creat: 0.57 mg/dL (ref 0.50–0.99)
Globulin: 2.5 g/dL (ref 1.9–3.7)
Glucose, Bld: 89 mg/dL (ref 65–99)
Potassium: 5 mmol/L (ref 3.5–5.3)
Sodium: 138 mmol/L (ref 135–146)
Total Bilirubin: 0.3 mg/dL (ref 0.2–1.2)
Total Protein: 6.7 g/dL (ref 6.1–8.1)
eGFR: 114 mL/min/{1.73_m2} (ref >=60)

## 2023-07-20 LAB — CBC WITH DIFFERENTIAL/PLATELET
Absolute Lymphocytes: 1427 {cells}/uL (ref 850–3900)
Absolute Monocytes: 464 {cells}/uL (ref 200–950)
Basophils Absolute: 52 {cells}/uL (ref 0–200)
Basophils Relative: 0.9 %
Eosinophils Absolute: 342 {cells}/uL (ref 15–500)
Eosinophils Relative: 5.9 %
HCT: 41.6 % (ref 35.0–45.0)
Hemoglobin: 13.3 g/dL (ref 11.7–15.5)
MCH: 31 pg (ref 27.0–33.0)
MCHC: 32 g/dL (ref 32.0–36.0)
MCV: 97 fL (ref 80.0–100.0)
MPV: 9.3 fL (ref 7.5–12.5)
Monocytes Relative: 8 %
Neutro Abs: 3515 {cells}/uL (ref 1500–7800)
Neutrophils Relative %: 60.6 %
Platelets: 340 10*3/uL (ref 140–400)
RBC: 4.29 10*6/uL (ref 3.80–5.10)
RDW: 11.4 % (ref 11.0–15.0)
Total Lymphocyte: 24.6 %
WBC: 5.8 10*3/uL (ref 3.8–10.8)

## 2023-07-20 LAB — VITAMIN D 25 HYDROXY (VIT D DEFICIENCY, FRACTURES): Vit D, 25-Hydroxy: 29 ng/mL — ABNORMAL LOW (ref 30–100)

## 2023-07-20 LAB — TSH: TSH: 0.99 m[IU]/L

## 2023-07-20 LAB — LIPID PANEL
Cholesterol: 224 mg/dL — ABNORMAL HIGH (ref <200)
HDL: 84 mg/dL (ref >=50)
LDL Cholesterol (Calc): 125 mg/dL — ABNORMAL HIGH
Non-HDL Cholesterol (Calc): 140 mg/dL — ABNORMAL HIGH (ref <130)
Total CHOL/HDL Ratio: 2.7 (calc) (ref <5.0)
Triglycerides: 63 mg/dL (ref <150)

## 2023-07-20 LAB — MAGNESIUM: Magnesium: 2 mg/dL (ref 1.5–2.5)

## 2023-07-20 LAB — HEMOGLOBIN A1C W/OUT EAG: Hgb A1c MFr Bld: 5.7 %{Hb} — ABNORMAL HIGH (ref <5.7)

## 2023-07-23 LAB — URINALYSIS, ROUTINE W REFLEX MICROSCOPIC
Bilirubin Urine: NEGATIVE
Glucose, UA: NEGATIVE
Hgb urine dipstick: NEGATIVE
Ketones, ur: NEGATIVE
Leukocytes,Ua: NEGATIVE
Nitrite: NEGATIVE
Protein, ur: NEGATIVE
Specific Gravity, Urine: 1.01 (ref 1.001–1.035)
pH: 7 (ref 5.0–8.0)

## 2023-07-23 LAB — MICROALBUMIN / CREATININE URINE RATIO
Creatinine, Urine: 26 mg/dL (ref 20–275)
Microalb Creat Ratio: 12 mg/g{creat} (ref <30)
Microalb, Ur: 0.3 mg/dL

## 2023-08-03 DIAGNOSIS — F902 Attention-deficit hyperactivity disorder, combined type: Secondary | ICD-10-CM | POA: Diagnosis not present

## 2023-09-10 ENCOUNTER — Encounter: Payer: Self-pay | Admitting: Physician Assistant

## 2023-09-16 DIAGNOSIS — F900 Attention-deficit hyperactivity disorder, predominantly inattentive type: Secondary | ICD-10-CM | POA: Diagnosis not present

## 2023-09-16 DIAGNOSIS — H6123 Impacted cerumen, bilateral: Secondary | ICD-10-CM | POA: Diagnosis not present

## 2023-09-16 DIAGNOSIS — F79 Unspecified intellectual disabilities: Secondary | ICD-10-CM | POA: Diagnosis not present

## 2023-09-28 DIAGNOSIS — F902 Attention-deficit hyperactivity disorder, combined type: Secondary | ICD-10-CM | POA: Diagnosis not present

## 2023-10-25 NOTE — Progress Notes (Unsigned)
 10/26/2023 Cathy Howard [id number] 1975/09/15  Referring provider: Lucky Cowboy, MD Primary GI doctor: Dr. Leonides Schanz  ASSESSMENT AND PLAN:   GERD 2012 abdominal ultrasound cholelithiasis without cholecystitis upper limit of normal 6 mm CBD 07/19/2023 unremarkable LFTs remote history 1 year ago of minor AST elevation isolated at 35 Rare GERD, worse with chocolate -Will get repeat RUQ [place] -Lifestyle changes discussed, avoid NSAIDS, ETOH, hand out given to the patient -Weight loss discussed with the patient  Screening colonoscopy 07/19/2023 labs reviewed no anemia No family history of colon cancer No blood in stool, no changes in bowel habits With patient's developmental delay, prefers to avoid colonoscopy Long discussion with the patient about cologuard versus colonoscopy without family history, no personal history of polyps and no symptoms at this time will schedule for cologuard, discussed false positives with patient and she understands risk of that and possibly still needing a colonoscopy in the future.  History of developmental delay/intellectual disabilities Mother with her and gave her history   Patient Care Team: Lucky Cowboy, MD as PCP - General (Internal Medicine) Tracey Harries, MD as Consulting Physician (Obstetrics and Gynecology) Mat Carne, DO (Inactive) (Optometry) Regal, Kirstie Peri, DPM as Consulting Physician (Podiatry)  HISTORY OF PRESENT ILLNESS: 48 y.o. female with a past medical history listed below presents as a new patient for evaluation of a colonoscopy.   Patient has history of intellectual disability/developmental delay follows with psychology, mom accompanies patient.  Discussed the use of AI scribe software for clinical note transcription with the patient, who gave verbal consent to proceed.  History of Present Illness   Cathy Howard is a 48 year old female who presents for colon cancer screening.  She has no family history of colon  cancer and denies changes in bowel habits or blood in the stool. She humorously notes that consuming mayonnaise at night might help with bowel movements. She is aware of the recent change in screening age from 74 to 89.  She experiences occasional gastroesophageal reflux symptoms, particularly when consuming chocolate after 7 PM. She describes a habit of lying on her bed in a way that sometimes results in regurgitation, possibly due to pressure on her abdomen. No abdominal pain or discomfort is reported.  An abdominal ultrasound in 2012 showed gallstones without inflammation and a slightly enlarged common bile duct. She has not experienced related symptoms such as nausea, vomiting, or reflux. Liver function tests have been normal.  She typically has daily bowel movements and denies shortness of breath, chest pain, and difficulty swallowing. She is not on blood thinners but occasionally takes baby aspirin. She has a high tolerance for pain and does not complain unless it is severe. She does not use Aleve, ibuprofen, or Goody powders, and only takes Tylenol for headaches, which are usually allergy-related.      She  reports that she has never smoked. She has never used smokeless tobacco. She reports that she does not drink alcohol and does not use drugs.  RELEVANT GI HISTORY, IMAGING AND LABS: Results   LABS Liver function tests: Normal  RADIOLOGY Abdominal ultrasound: Cholelithiasis without cholecystitis, common bile duct upper limits of normal (2012)      CBC    Component Value Date/Time   WBC 5.8 07/19/2023 1012   RBC 4.29 07/19/2023 1012   HGB 13.3 07/19/2023 1012   HCT 41.6 07/19/2023 1012   PLT 340 07/19/2023 1012   MCV 97.0 07/19/2023 1012   MCH 31.0 07/19/2023 1012  MCHC 32.0 07/19/2023 1012   RDW 11.4 07/19/2023 1012   LYMPHSABS 1,428 03/18/2023 1144   MONOABS 456 04/14/2016 0942   EOSABS 342 07/19/2023 1012   BASOSABS 52 07/19/2023 1012   Recent Labs    01/14/23 1503  03/18/23 1144 07/19/23 1012  HGB 12.5 13.4 13.3    CMP     Component Value Date/Time   NA 138 07/19/2023 1012   K 5.0 07/19/2023 1012   CL 106 07/19/2023 1012   CO2 25 07/19/2023 1012   GLUCOSE 89 07/19/2023 1012   BUN 19 07/19/2023 1012   CREATININE 0.57 07/19/2023 1012   CALCIUM 9.3 07/19/2023 1012   PROT 6.7 07/19/2023 1012   ALBUMIN 4.4 04/14/2016 0942   AST 22 07/19/2023 1012   ALT 13 07/19/2023 1012   ALKPHOS 78 04/14/2016 0942   BILITOT 0.3 07/19/2023 1012   GFRNONAA 112 10/31/2020 0931   GFRAA 129 10/31/2020 0931      Latest Ref Rng & Units 07/19/2023   10:12 AM 01/14/2023    3:03 PM 07/16/2022   12:00 AM  Hepatic Function  Total Protein 6.1 - 8.1 g/dL 6.7  6.5  6.9   AST 10 - 35 U/L 22  18  33   ALT 6 - 29 U/L 13  12  16    Total Bilirubin 0.2 - 1.2 mg/dL 0.3  0.2  0.3       Current Medications:     Current Outpatient Medications (Respiratory):    fexofenadine (ALLEGRA) 180 MG tablet, Take 180 mg by mouth daily.   fluticasone (FLONASE) 50 MCG/ACT nasal spray, SHAKE LIQUID AND USE 2 SPRAYS IN EACH NOSTRIL AT BEDTIME  Current Outpatient Medications (Analgesics):    acetaminophen (TYLENOL) 500 MG tablet, Take by mouth. PRN   aspirin EC 81 MG tablet, Take 81 mg by mouth daily. Swallow whole.   Current Outpatient Medications (Other):    amphetamine-dextroamphetamine (ADDERALL XR) 15 MG 24 hr capsule, Take by mouth 5 days.   CHOLECALCIFEROL PO, Take 5,000 Units by mouth daily.   famotidine (PEPCID) 20 MG tablet, Take 20 mg by mouth daily as needed. Prior to breakfast.   FLUoxetine (PROZAC) 20 MG capsule, Take 60 mg by mouth every morning.   Magnesium 250 MG TABS, Take by mouth daily.   Multiple Vitamin (MULTIVITAMIN) tablet, Take 1 tablet by mouth daily.   mupirocin ointment (BACTROBAN) 2 %, Apply twice daily for 1-2 weeks.   topiramate (TOPAMAX) 100 MG tablet, Take 100 mg by mouth 2 (two) times daily.   vitamin C (ASCORBIC ACID) 500 MG tablet, Take 500  mg by mouth daily.  Medical History:  Past Medical History:  Diagnosis Date   ADD (attention deficit disorder)    Hip dysplasia, congenital 01/25/2019   Hyperlipidemia    Mental retardation    OCD (obsessive compulsive disorder)    S/P hip replacement, bilateral 05/16/2019   Unspecified vitamin D deficiency    Allergies:  Allergies  Allergen Reactions   Fentanyl Other (See Comments)    O2 decreases   Maxitrol [Neomycin-Polymyxin-Dexameth]    Neomycin-Bacitracin Zn-Polymyx Swelling   Quetiapine Other (See Comments)    Over sedation      Surgical History:  She  has a past surgical history that includes Hip Arthroplasty (Left, 10/2019); scoliosis surgery; Eye surgery; and Hip Arthroplasty (Right, 05/01/2019). Family History:  Her family history includes Cancer in her mother; Diabetes in her maternal grandmother; Heart disease in her maternal grandmother and mother; Hyperlipidemia in her  mother; Stroke in her maternal grandmother.  REVIEW OF SYSTEMS  : All other systems reviewed and negative except where noted in the History of Present Illness.  PHYSICAL EXAM: BP (!) 132/100   Pulse 76   Ht 4\' 9"  (1.448 m)   Wt 140 lb 12.8 oz (63.9 kg)   BMI 30.47 kg/m  Physical Exam   GENERAL APPEARANCE: Well nourished, in no apparent distress HEENT: No cervical lymphadenopathy, unremarkable thyroid, sclerae anicteric, conjunctiva pink RESPIRATORY: Respiratory effort normal, breath sounds clear to auscultation bilaterally without rales, rhonchi, or wheezing CARDIO: RRR with no MRGs, peripheral pulses intact ABDOMEN: Soft, non-distended, active bowel sounds in all 4 quadrants, non-tender to palpation, no rebound, no mass appreciated RECTAL: declines MUSCULOSKELETAL: Full ROM, normal gait, without edema SKIN: Dry, intact without rashes or lesions. No jaundice. NEURO: Alert, oriented, no focal deficits PSYCH: Cooperative, normal mood and affect.      Doree Albee, PA-C 10:40 AM

## 2023-10-26 ENCOUNTER — Encounter: Payer: Self-pay | Admitting: Physician Assistant

## 2023-10-26 ENCOUNTER — Ambulatory Visit (INDEPENDENT_AMBULATORY_CARE_PROVIDER_SITE_OTHER): Payer: MEDICAID | Admitting: Physician Assistant

## 2023-10-26 VITALS — BP 132/100 | HR 76 | Ht <= 58 in | Wt 140.8 lb

## 2023-10-26 DIAGNOSIS — R7989 Other specified abnormal findings of blood chemistry: Secondary | ICD-10-CM

## 2023-10-26 DIAGNOSIS — K219 Gastro-esophageal reflux disease without esophagitis: Secondary | ICD-10-CM | POA: Diagnosis not present

## 2023-10-26 DIAGNOSIS — Z8719 Personal history of other diseases of the digestive system: Secondary | ICD-10-CM

## 2023-10-26 DIAGNOSIS — Z1211 Encounter for screening for malignant neoplasm of colon: Secondary | ICD-10-CM

## 2023-10-26 NOTE — Progress Notes (Signed)
 I agree with the assessment and plan as outlined by Ms. Steffanie Dunn.

## 2023-10-26 NOTE — Patient Instructions (Addendum)
 You have been scheduled for an abdominal ultrasound at [facility] (1st floor of hospital) on 10/28/23 at 11:00am. Please arrive 30 minutes prior to your appointment for registration. Make certain not to have anything to eat or drink 6 hours prior to your appointment. Should you need to reschedule your appointment, please contact radiology at [phone]. This test typically takes about 30 minutes to perform.  COLOGUARD INFORMATION  Colon cancer is 3rd most diagnosed cancer and 2nd leading cause of death in both men and women 58 years of age and older despite being one of the most preventable and treatable cancers if found early.  4 of out 5 people diagnosed with colon cancer have NO prior family history.  When caught EARLY 90% of colon cancer is curable.   More than 92% of cologuard patients have NO out of pocket cost for screening however only your insurer can confirm how Cologuard would be covered for you.  [facility] has a team of specialist that can help you contact your insurer and ask the right questions.  PRIOR to completing the test, it is YOUR responsibility to contact your insurance about covered benefits for this test. Your out of pocket expense could be anywhere from $0.00 to $649.00.  Please call [phone] so they can help.   When you call to check coverage with your insurer, please provide the following information:   -The ONLY provider of Cologuard is [facility], dispensing  - CPT code for Cologuard is [phone].  [facility] NPI # [id number]  -[employer] Tax ID # [id number]   We have already sent your demographic and insurance information to [employer] (phone number [phone]) and they should contact you within the next week regarding your test. If you have not heard from them within the next week, please call our office at [phone].  You will receive a short call from [employer] Customer support center at [employer], when you receive a call they will say they are from [employer],  to confirm your mailing address and give you more information.  When they calll you, it will appear on the caller ID as "[employer]" or in some cases only this number will appear, [phone].   [employer] will ship your collection kit directly to you. You will collect a single stool sample in the privacy of your own home, no special preparation required. You will return the kit via UPS pre-paid shipping or pick-up, in the same box it arrived in. Then I will contact you to discuss your results after I receive them from the laboratory.   If you have any questions or concerns, [facility] Customer Support Specialist are available 24 hours a day, 7 days a week at [phone] or go to [email or web address]    Can take pepcid as needed Avoid spicy and acidic foods Avoid fatty foods Limit your intake of coffee, tea, alcohol, and carbonated drinks Work to maintain a healthy weight Keep the head of the bed elevated at least 3 inches with blocks or a wedge pillow if you are having any nighttime symptoms Stay upright for 2 hours after eating Avoid meals and snacks three to four hours before bedtime

## 2023-10-28 ENCOUNTER — Ambulatory Visit (HOSPITAL_COMMUNITY)
Admission: RE | Admit: 2023-10-28 | Discharge: 2023-10-28 | Disposition: A | Discharge status: Home / Self Care | Payer: MEDICAID | Source: Ambulatory Visit | Attending: Physician Assistant | Admitting: Physician Assistant

## 2023-10-28 DIAGNOSIS — R935 Abnormal findings on diagnostic imaging of other abdominal regions, including retroperitoneum: Secondary | ICD-10-CM | POA: Diagnosis not present

## 2023-10-28 DIAGNOSIS — K219 Gastro-esophageal reflux disease without esophagitis: Secondary | ICD-10-CM | POA: Insufficient documentation

## 2023-10-28 DIAGNOSIS — K8020 Calculus of gallbladder without cholecystitis without obstruction: Secondary | ICD-10-CM | POA: Diagnosis not present

## 2023-10-28 DIAGNOSIS — R7989 Other specified abnormal findings of blood chemistry: Secondary | ICD-10-CM | POA: Diagnosis not present

## 2023-11-06 DIAGNOSIS — S40812A Abrasion of left upper arm, initial encounter: Secondary | ICD-10-CM | POA: Diagnosis not present

## 2023-11-06 DIAGNOSIS — H1131 Conjunctival hemorrhage, right eye: Secondary | ICD-10-CM | POA: Diagnosis not present

## 2023-11-06 DIAGNOSIS — W19XXXA Unspecified fall, initial encounter: Secondary | ICD-10-CM | POA: Diagnosis not present

## 2023-11-29 DIAGNOSIS — H15832 Staphyloma posticum, left eye: Secondary | ICD-10-CM | POA: Diagnosis not present

## 2023-11-29 DIAGNOSIS — H524 Presbyopia: Secondary | ICD-10-CM | POA: Diagnosis not present

## 2023-11-29 DIAGNOSIS — H43813 Vitreous degeneration, bilateral: Secondary | ICD-10-CM | POA: Diagnosis not present

## 2023-11-29 DIAGNOSIS — H40052 Ocular hypertension, left eye: Secondary | ICD-10-CM | POA: Diagnosis not present

## 2023-11-29 DIAGNOSIS — H5213 Myopia, bilateral: Secondary | ICD-10-CM | POA: Diagnosis not present

## 2023-11-29 DIAGNOSIS — H2511 Age-related nuclear cataract, right eye: Secondary | ICD-10-CM | POA: Diagnosis not present

## 2023-12-01 DIAGNOSIS — E559 Vitamin D deficiency, unspecified: Secondary | ICD-10-CM | POA: Diagnosis not present

## 2023-12-01 DIAGNOSIS — M810 Age-related osteoporosis without current pathological fracture: Secondary | ICD-10-CM | POA: Diagnosis not present

## 2023-12-01 DIAGNOSIS — M8589 Other specified disorders of bone density and structure, multiple sites: Secondary | ICD-10-CM | POA: Diagnosis not present

## 2023-12-01 DIAGNOSIS — Z5181 Encounter for therapeutic drug level monitoring: Secondary | ICD-10-CM | POA: Diagnosis not present

## 2023-12-01 DIAGNOSIS — M4325 Fusion of spine, thoracolumbar region: Secondary | ICD-10-CM | POA: Diagnosis not present

## 2023-12-01 DIAGNOSIS — Z780 Asymptomatic menopausal state: Secondary | ICD-10-CM | POA: Diagnosis not present

## 2023-12-03 DIAGNOSIS — F902 Attention-deficit hyperactivity disorder, combined type: Secondary | ICD-10-CM | POA: Diagnosis not present

## 2023-12-22 DIAGNOSIS — R051 Acute cough: Secondary | ICD-10-CM | POA: Diagnosis not present

## 2023-12-22 DIAGNOSIS — R509 Fever, unspecified: Secondary | ICD-10-CM | POA: Diagnosis not present

## 2023-12-22 DIAGNOSIS — J069 Acute upper respiratory infection, unspecified: Secondary | ICD-10-CM | POA: Diagnosis not present

## 2024-01-03 DIAGNOSIS — Z1211 Encounter for screening for malignant neoplasm of colon: Secondary | ICD-10-CM | POA: Diagnosis not present

## 2024-01-07 ENCOUNTER — Ambulatory Visit: Payer: Self-pay | Admitting: Physician Assistant

## 2024-01-07 LAB — COLOGUARD: COLOGUARD: NEGATIVE

## 2024-01-13 ENCOUNTER — Ambulatory Visit: Payer: BC Managed Care – PPO | Admitting: Nurse Practitioner

## 2024-02-01 DIAGNOSIS — F902 Attention-deficit hyperactivity disorder, combined type: Secondary | ICD-10-CM | POA: Diagnosis not present

## 2024-03-02 DIAGNOSIS — Z130 Encounter for screening for diseases of the blood and blood-forming organs and certain disorders involving the immune mechanism: Secondary | ICD-10-CM | POA: Diagnosis not present

## 2024-03-02 DIAGNOSIS — Z01419 Encounter for gynecological examination (general) (routine) without abnormal findings: Secondary | ICD-10-CM | POA: Diagnosis not present

## 2024-03-02 DIAGNOSIS — Z1231 Encounter for screening mammogram for malignant neoplasm of breast: Secondary | ICD-10-CM | POA: Diagnosis not present

## 2024-05-17 DIAGNOSIS — Z471 Aftercare following joint replacement surgery: Secondary | ICD-10-CM | POA: Diagnosis not present

## 2024-05-17 DIAGNOSIS — Z96643 Presence of artificial hip joint, bilateral: Secondary | ICD-10-CM | POA: Diagnosis not present

## 2024-05-23 DIAGNOSIS — F902 Attention-deficit hyperactivity disorder, combined type: Secondary | ICD-10-CM | POA: Diagnosis not present

## 2024-06-05 DIAGNOSIS — H50112 Monocular exotropia, left eye: Secondary | ICD-10-CM | POA: Diagnosis not present

## 2024-06-05 DIAGNOSIS — H17822 Peripheral opacity of cornea, left eye: Secondary | ICD-10-CM | POA: Diagnosis not present

## 2024-06-05 DIAGNOSIS — Z961 Presence of intraocular lens: Secondary | ICD-10-CM | POA: Diagnosis not present

## 2024-06-05 DIAGNOSIS — H15832 Staphyloma posticum, left eye: Secondary | ICD-10-CM | POA: Diagnosis not present

## 2024-07-18 ENCOUNTER — Encounter: Payer: BC Managed Care – PPO | Admitting: Nurse Practitioner
